# Patient Record
Sex: Male | Born: 1945
Health system: Southern US, Community
[De-identification: ages and names within clinical notes are randomized; demographics above are authoritative.]

## PROBLEM LIST (undated history)

## (undated) DIAGNOSIS — C61 Malignant neoplasm of prostate: Secondary | ICD-10-CM

## (undated) DIAGNOSIS — M75102 Unspecified rotator cuff tear or rupture of left shoulder, not specified as traumatic: Secondary | ICD-10-CM

## (undated) DIAGNOSIS — C4491 Basal cell carcinoma of skin, unspecified: Secondary | ICD-10-CM

## (undated) DIAGNOSIS — I00 Rheumatic fever without heart involvement: Secondary | ICD-10-CM

## (undated) DIAGNOSIS — N200 Calculus of kidney: Secondary | ICD-10-CM

## (undated) DIAGNOSIS — E291 Testicular hypofunction: Secondary | ICD-10-CM

## (undated) DIAGNOSIS — I519 Heart disease, unspecified: Secondary | ICD-10-CM

## (undated) DIAGNOSIS — K5792 Diverticulitis of intestine, part unspecified, without perforation or abscess without bleeding: Secondary | ICD-10-CM

## (undated) DIAGNOSIS — I480 Paroxysmal atrial fibrillation: Secondary | ICD-10-CM

## (undated) HISTORY — DX: Basal cell carcinoma of skin, unspecified: C44.91

## (undated) HISTORY — DX: Rheumatic fever without heart involvement: I00

## (undated) HISTORY — DX: Diverticulitis of intestine, part unspecified, without perforation or abscess without bleeding: K57.92

## (undated) HISTORY — DX: Heart disease, unspecified: I51.9

## (undated) HISTORY — DX: Malignant neoplasm of prostate: C61

## (undated) HISTORY — DX: Testicular hypofunction: E29.1

## (undated) HISTORY — PX: COLONOSCOPY: SHX174

## (undated) HISTORY — DX: Paroxysmal atrial fibrillation: I48.0

## (undated) HISTORY — DX: Calculus of kidney: N20.0

---

## 1998-04-12 ENCOUNTER — Encounter: Admission: RE | Admit: 1998-04-12 | Discharge: 1998-04-12 | Payer: Self-pay | Admitting: Family Medicine

## 1998-05-16 ENCOUNTER — Encounter: Admission: RE | Admit: 1998-05-16 | Discharge: 1998-05-16 | Payer: Self-pay | Admitting: Sports Medicine

## 1998-06-27 ENCOUNTER — Encounter: Admission: RE | Admit: 1998-06-27 | Discharge: 1998-06-27 | Payer: Self-pay | Admitting: Sports Medicine

## 1998-10-10 ENCOUNTER — Encounter: Admission: RE | Admit: 1998-10-10 | Discharge: 1998-10-10 | Payer: Self-pay | Admitting: Family Medicine

## 1999-08-03 ENCOUNTER — Observation Stay (HOSPITAL_COMMUNITY): Admission: RE | Admit: 1999-08-03 | Discharge: 1999-08-04 | Payer: Self-pay | Admitting: Orthopedic Surgery

## 2000-09-24 HISTORY — PX: OTHER SURGICAL HISTORY: SHX169

## 2000-11-15 ENCOUNTER — Encounter: Admission: RE | Admit: 2000-11-15 | Discharge: 2000-11-15 | Payer: Self-pay | Admitting: Family Medicine

## 2001-05-09 ENCOUNTER — Encounter: Payer: Self-pay | Admitting: Emergency Medicine

## 2001-05-09 ENCOUNTER — Emergency Department (HOSPITAL_COMMUNITY): Admission: EM | Admit: 2001-05-09 | Discharge: 2001-05-09 | Payer: Self-pay | Admitting: *Deleted

## 2002-01-06 ENCOUNTER — Ambulatory Visit (HOSPITAL_COMMUNITY): Admission: RE | Admit: 2002-01-06 | Discharge: 2002-01-06 | Payer: Self-pay | Admitting: Gastroenterology

## 2003-07-15 ENCOUNTER — Encounter: Payer: Self-pay | Admitting: Emergency Medicine

## 2003-07-15 ENCOUNTER — Emergency Department (HOSPITAL_COMMUNITY): Admission: EM | Admit: 2003-07-15 | Discharge: 2003-07-15 | Payer: Self-pay | Admitting: Emergency Medicine

## 2003-07-16 ENCOUNTER — Encounter: Payer: Self-pay | Admitting: Emergency Medicine

## 2003-09-25 HISTORY — PX: KNEE ARTHROSCOPY W/ PARTIAL MEDIAL MENISCECTOMY: SHX1882

## 2003-09-25 HISTORY — PX: KNEE SURGERY: SHX244

## 2005-08-30 ENCOUNTER — Ambulatory Visit (HOSPITAL_BASED_OUTPATIENT_CLINIC_OR_DEPARTMENT_OTHER): Admission: RE | Admit: 2005-08-30 | Discharge: 2005-08-30 | Payer: Self-pay | Admitting: Orthopedic Surgery

## 2005-08-30 ENCOUNTER — Ambulatory Visit (HOSPITAL_COMMUNITY): Admission: RE | Admit: 2005-08-30 | Discharge: 2005-08-30 | Payer: Self-pay | Admitting: Orthopedic Surgery

## 2006-12-19 ENCOUNTER — Inpatient Hospital Stay (HOSPITAL_COMMUNITY): Admission: RE | Admit: 2006-12-19 | Discharge: 2006-12-20 | Payer: Self-pay | Admitting: Urology

## 2006-12-19 ENCOUNTER — Encounter (INDEPENDENT_AMBULATORY_CARE_PROVIDER_SITE_OTHER): Payer: Self-pay | Admitting: Specialist

## 2006-12-19 HISTORY — PX: ROBOT ASSISTED LAPAROSCOPIC RADICAL PROSTATECTOMY: SHX5141

## 2006-12-19 HISTORY — PX: PROSTATE SURGERY: SHX751

## 2007-02-18 ENCOUNTER — Ambulatory Visit: Admission: RE | Admit: 2007-02-18 | Discharge: 2007-04-11 | Payer: Self-pay | Admitting: Radiation Oncology

## 2007-09-25 DIAGNOSIS — C4491 Basal cell carcinoma of skin, unspecified: Secondary | ICD-10-CM

## 2007-09-25 HISTORY — DX: Basal cell carcinoma of skin, unspecified: C44.91

## 2007-10-26 LAB — HM COLONOSCOPY: HM Colonoscopy: 1

## 2008-08-03 ENCOUNTER — Encounter: Payer: Self-pay | Admitting: Family Medicine

## 2008-09-23 LAB — HM COLONOSCOPY

## 2008-11-10 ENCOUNTER — Ambulatory Visit: Payer: Self-pay | Admitting: Family Medicine

## 2008-11-10 DIAGNOSIS — Z8679 Personal history of other diseases of the circulatory system: Secondary | ICD-10-CM | POA: Insufficient documentation

## 2008-11-10 DIAGNOSIS — Z85828 Personal history of other malignant neoplasm of skin: Secondary | ICD-10-CM

## 2008-11-10 DIAGNOSIS — Z8546 Personal history of malignant neoplasm of prostate: Secondary | ICD-10-CM

## 2008-11-10 DIAGNOSIS — Z8719 Personal history of other diseases of the digestive system: Secondary | ICD-10-CM | POA: Insufficient documentation

## 2009-04-19 ENCOUNTER — Telehealth: Payer: Self-pay | Admitting: Family Medicine

## 2009-04-20 ENCOUNTER — Encounter (INDEPENDENT_AMBULATORY_CARE_PROVIDER_SITE_OTHER): Payer: Self-pay | Admitting: *Deleted

## 2009-04-27 ENCOUNTER — Ambulatory Visit: Payer: Self-pay | Admitting: Family Medicine

## 2009-04-27 DIAGNOSIS — R5383 Other fatigue: Secondary | ICD-10-CM

## 2009-04-27 DIAGNOSIS — R5381 Other malaise: Secondary | ICD-10-CM

## 2009-04-27 LAB — CONVERTED CEMR LAB
ALT: 20 units/L (ref 0–53)
AST: 28 units/L (ref 0–37)
Albumin: 4 g/dL (ref 3.5–5.2)
Alkaline Phosphatase: 78 units/L (ref 39–117)
BUN: 16 mg/dL (ref 6–23)
Bilirubin, Direct: 0.1 mg/dL (ref 0.0–0.3)
CO2: 31 meq/L (ref 19–32)
Calcium: 9.1 mg/dL (ref 8.4–10.5)
Chloride: 106 meq/L (ref 96–112)
Cholesterol: 185 mg/dL (ref 0–200)
Creatinine, Ser: 1.3 mg/dL (ref 0.4–1.5)
GFR calc non Af Amer: 58.84 mL/min (ref >60)
Glucose, Bld: 88 mg/dL (ref 70–99)
HDL: 54.9 mg/dL (ref >39.00)
LDL Cholesterol: 122 mg/dL — ABNORMAL HIGH (ref 0–99)
Potassium: 4.3 meq/L (ref 3.5–5.1)
Sodium: 141 meq/L (ref 135–145)
TSH: 2.07 microintl units/mL (ref 0.35–5.50)
Total Bilirubin: 1.2 mg/dL (ref 0.3–1.2)
Total CHOL/HDL Ratio: 3
Total Protein: 6.7 g/dL (ref 6.0–8.3)
Triglycerides: 43 mg/dL (ref 0.0–149.0)
VLDL: 8.6 mg/dL (ref 0.0–40.0)

## 2009-05-02 ENCOUNTER — Ambulatory Visit: Payer: Self-pay | Admitting: Family Medicine

## 2009-08-04 ENCOUNTER — Ambulatory Visit: Payer: Self-pay | Admitting: Family Medicine

## 2009-08-04 ENCOUNTER — Telehealth: Payer: Self-pay | Admitting: Family Medicine

## 2009-08-04 ENCOUNTER — Encounter: Payer: Self-pay | Admitting: Family Medicine

## 2009-08-04 DIAGNOSIS — M7989 Other specified soft tissue disorders: Secondary | ICD-10-CM

## 2010-06-05 ENCOUNTER — Ambulatory Visit: Payer: Self-pay | Admitting: Family Medicine

## 2010-06-05 LAB — CONVERTED CEMR LAB
ALT: 18 units/L (ref 0–53)
AST: 26 units/L (ref 0–37)
Albumin: 4.1 g/dL (ref 3.5–5.2)
Alkaline Phosphatase: 65 units/L (ref 39–117)
BUN: 17 mg/dL (ref 6–23)
Basophils Absolute: 0 10*3/uL (ref 0.0–0.1)
Basophils Relative: 0.7 % (ref 0.0–3.0)
Bilirubin, Direct: 0.2 mg/dL (ref 0.0–0.3)
CO2: 30 meq/L (ref 19–32)
Calcium: 9.1 mg/dL (ref 8.4–10.5)
Chloride: 104 meq/L (ref 96–112)
Cholesterol: 164 mg/dL (ref 0–200)
Creatinine, Ser: 1.1 mg/dL (ref 0.4–1.5)
Eosinophils Absolute: 0.2 10*3/uL (ref 0.0–0.7)
Eosinophils Relative: 3.7 % (ref 0.0–5.0)
GFR calc non Af Amer: 75.03 mL/min (ref >60)
Glucose, Bld: 86 mg/dL (ref 70–99)
HCT: 40.9 % (ref 39.0–52.0)
HDL: 49.9 mg/dL (ref >39.00)
Hemoglobin: 13.9 g/dL (ref 13.0–17.0)
LDL Cholesterol: 104 mg/dL — ABNORMAL HIGH (ref 0–99)
Lymphocytes Relative: 29.6 % (ref 12.0–46.0)
Lymphs Abs: 1.5 10*3/uL (ref 0.7–4.0)
MCHC: 33.9 g/dL (ref 30.0–36.0)
MCV: 95.4 fL (ref 78.0–100.0)
Monocytes Absolute: 0.4 10*3/uL (ref 0.1–1.0)
Monocytes Relative: 7.9 % (ref 3.0–12.0)
Neutro Abs: 2.9 10*3/uL (ref 1.4–7.7)
Neutrophils Relative %: 58.1 % (ref 43.0–77.0)
PSA: 0.02 ng/mL — ABNORMAL LOW (ref 0.10–4.00)
Platelets: 189 10*3/uL (ref 150.0–400.0)
Potassium: 4.1 meq/L (ref 3.5–5.1)
RBC: 4.29 M/uL (ref 4.22–5.81)
RDW: 13.3 % (ref 11.5–14.6)
Sodium: 142 meq/L (ref 135–145)
TSH: 2.99 microintl units/mL (ref 0.35–5.50)
Total Bilirubin: 0.9 mg/dL (ref 0.3–1.2)
Total CHOL/HDL Ratio: 3
Total Protein: 6.3 g/dL (ref 6.0–8.3)
Triglycerides: 52 mg/dL (ref 0.0–149.0)
VLDL: 10.4 mg/dL (ref 0.0–40.0)
WBC: 5.1 10*3/uL (ref 4.5–10.5)

## 2010-06-26 ENCOUNTER — Ambulatory Visit: Payer: Self-pay | Admitting: Family Medicine

## 2010-10-24 NOTE — Progress Notes (Signed)
Summary: Implant ID Card Brought by Patient  Implant ID Card Brought by Patient   Imported By: Lanelle Bal 07/03/2010 09:13:44  _____________________________________________________________________  External Attachment:    Type:   Image     Comment:   External Document

## 2010-10-24 NOTE — Assessment & Plan Note (Signed)
Summary: CPX CYD   Vital Signs:  Patient profile:   65 year old male Height:      69.5 inches Weight:      171.0 pounds BMI:     24.98 Temp:     97.5 degrees F oral Pulse rate:   64 / minute Pulse rhythm:   regular BP sitting:   110 / 70  (left arm) Cuff size:   regular  Vitals Entered By: Benny Lennert CMA Duncan Dull) (June 26, 2010 12:09 PM)  Vision Screening:Left eye with correction: 20 / 20 Right eye with correction: 20 / 20 Both eyes with correction: 20 / 20  Color vision testing: normal   Ishmael Holter # 2: Pass     Vision Entered By: Benny Lennert CMA (AAMA) (June 26, 2010 12:27 PM)  Hearing Screen 40db HL: Left  500 hz: 40db 1000 hz: 40db 2000 hz: 40db 4000 hz: 40db Right  500 hz: 40db 1000 hz: 40db 2000 hz: 40db 4000 hz: 40db    History of Present Illness: Chief complaint cpx  Here for Medicare AWV:  1.   Risk factors based on Past M, S, F history: 2.   Physical Activities: running weights 3.   Depression/mood: negative, all normal 4.   Hearing: WNL 5.   ADL's: full function, no limitations, active 6.   Fall Risk: none. a runner. 7.   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   Height, weight, &visual acuity: WNL 9.   Counseling: doing well, good home life 10.   Labs ordered based on risk factors: reviewed 11.           Referral Coordination: none 12.           Care Plan: reviewed 1.            Cognitive Assessment: full financial care at home, no probs, upper level cognitive function, driving, finances, memory    Preventive Screening-Counseling & Management  Alcohol-Tobacco     Alcohol drinks/day: 0     Smoking Status: quit     Smoke Cessation Stage: quit     Tobacco Counseling: not indicated; no tobacco use  Caffeine-Diet-Exercise     Diet Counseling: not indicated; diet is assessed to be healthy     Does Patient Exercise: yes     Type of exercise: running     Exercise (avg: min/session): 4 miles running     Times/week: 5   Exercise Counseling: not indicated; exercise is adequate  Hep-HIV-STD-Contraception     Hepatitis Risk: no risk noted     HIV Risk: no risk noted     STD Risk: no risk noted     Contraception Counseling: not indicated; no questions/concerns expressed     Dental Care Counseling: not indicated; dental care within six months      Sexual History:  currently monogamous.        Drug Use:  never.    Clinical Review Panels:  Prevention   Last Colonoscopy:  2009 (09/23/2008)   Last PSA:  0.02 (06/05/2010)  Immunizations   Last Pneumovax:  given (08/14/2006)   Last Zoster Vaccine:  given (08/14/2006)  Lipid Management   Cholesterol:  164 (06/05/2010)   LDL (bad choesterol):  104 (06/05/2010)   HDL (good cholesterol):  49.90 (06/05/2010)  Diabetes Management   Creatinine:  1.1 (06/05/2010)   Last Pneumovax:  given (08/14/2006)  CBC   WBC:  5.1 (06/05/2010)   RBC:  4.29 (06/05/2010)   Hgb:  13.9 (06/05/2010)   Hct:  40.9 (06/05/2010)   Platelets:  189.0 (06/05/2010)   MCV  95.4 (06/05/2010)   MCHC  33.9 (06/05/2010)   RDW  13.3 (06/05/2010)   PMN:  58.1 (06/05/2010)   Lymphs:  29.6 (06/05/2010)   Monos:  7.9 (06/05/2010)   Eosinophils:  3.7 (06/05/2010)   Basophil:  0.7 (06/05/2010)  Complete Metabolic Panel   Glucose:  86 (06/05/2010)   Sodium:  142 (06/05/2010)   Potassium:  4.1 (06/05/2010)   Chloride:  104 (06/05/2010)   CO2:  30 (06/05/2010)   BUN:  17 (06/05/2010)   Creatinine:  1.1 (06/05/2010)   Albumin:  4.1 (06/05/2010)   Total Protein:  6.3 (06/05/2010)   Calcium:  9.1 (06/05/2010)   Total Bili:  0.9 (06/05/2010)   Alk Phos:  65 (06/05/2010)   SGPT (ALT):  18 (06/05/2010)   SGOT (AST):  26 (06/05/2010)   Allergies (verified): No Known Drug Allergies  Past History:  Past medical, surgical, family and social histories (including risk factors) reviewed, and no changes noted (except as noted below).  Past Medical History: Reviewed history from  04/19/2009 and no changes required. R ITB syndrome Prostate cancer, hx of:  pT2c N0 Mx, Gleason 3+4 = 7 Diverticulitis, hx of Kidney Stones Rheumatic Fever(1952/1953) Basal Cell CA, back, 2009 Elevated PSA Hypogonadism Diverticulosis Erectile Dysfunction h/o Nephrolithiasis  Ortho = Gioffre, [place] [facility] = Yetta Barre Urology = Laverle Patter  Past Surgical History: Reviewed history from 04/19/2009 and no changes required. shoulder /Rotator cuff (2002) (Gioffre) Knee surgery (2005) L[facility]oy[facility]), arthroscopy, partial medial menisectomy Prostate Ca (11/2006), robotic prostatectomy, 12/19/06  Upper endoscopy, 11/12/2007: normal Colonoscopy, 10/2007: 1 transverese colon polyp, diverticulosis  Family History: Reviewed history from 04/19/2009 and no changes required. Family History Breast cancer 1st degree relative <50 (mother) Family History of Heart Disease Family History of arthritis (other relative) Family History of Prostate Ca (other relative)  BRCA: mother, d/c 41 Brain Tumor: brother, 64 Lung CA: nephew, 35 Prostate CA: nephew  Social History: Reviewed history from 11/10/2008 and no changes required. Retired- Emergency planning/management officer Married Former Smoker ( occasionally [phone]) Alcohol use-no Drug use-no Regular exercise-yes  Review of Systems  General: Denies fever, chills, sweats, anorexia, fatigue, weakness, malaise Eyes: Denies blurring, vision loss ENT: Denies earache, nasal congestion, nosebleeds, sore throat, and hoarseness.  Cardiovascular: Denies chest pains, palpitations, syncope, dyspnea on exertion,  Respiratory: Denies cough, dyspnea at rest, excessive sputum,wheeezing GI: Denies nausea, vomiting, diarrhea, constipation, change in bowel habits, abdominal pain, melena, hematochezia GU: Denies dysuria, hematuria, discharge, urinary frequency, urinary hesitancy, nocturia, incontinence, genital sores, decreased libido Musculoskeletal: Denies back pain, joint pain Derm: Denies rash,  itching Neuro: Denies  paresthesias, frequent falls, frequent headaches, and difficulty walking.  Psych: Denies depression, anxiety Endocrine: Denies cold intolerance, heat intolerance, polydipsia, polyphagia, polyuria, and unusual weight change.  Heme: Denies enlarged lymph nodes Allergy: No hayfever    Impression & Recommendations:  Problem # 1:  HEALTH MAINTENANCE EXAM (ICD-V70.0)  The patient's preventative maintenance and recommended screening tests for an annual wellness exam were reviewed in full today. Brought up to date unless services declined.  Counselled on the importance of diet, exercise, and its role in overall health and mortality. The patient's FH and SH was reviewed, including their home life, tobacco status, and drug and alcohol status.   Orders: Medicare -1st Annual Wellness Visit [phone])  Complete Medication List: 1)  Aspirin Adult Low Strength 81 Mg Tbec (Aspirin) .... Take 1 tablet by mouth once a  day  Current Allergies (reviewed today): No known allergies      Prevention & Chronic Care Immunizations   Influenza vaccine: Not documented   Influenza vaccine deferral: Deferred  (06/26/2010)    Tetanus booster: Not documented   Tetanus booster due: 09/25/2015    Pneumococcal vaccine: given  (08/14/2006)   Pneumococcal vaccine due: None    H. zoster vaccine: 08/14/2006: given  Colorectal Screening   Hemoccult: Not documented    Colonoscopy: 2009  (09/23/2008)   Colonoscopy action/deferral: Not indicated  (06/26/2010)   Colonoscopy due: 09/23/2018  Other Screening   PSA: 0.02  (06/05/2010)   PSA due due: 06/06/2011   Smoking status: quit  (06/26/2010)  Lipids   Total Cholesterol: 164  (06/05/2010)   LDL: 104  (06/05/2010)   LDL Direct: Not documented   HDL: 49.90  (06/05/2010)   Triglycerides: 52.0  (06/05/2010)  Physical Exam General Appearance: well developed, well nourished, no acute distress Eyes: conjunctiva and lids normal,  PERRLA, EOMI Ears, Nose, Mouth, Throat: TM clear, nares clear, oral exam WNL Neck: supple, no lymphadenopathy, no thyromegaly, no JVD Respiratory: clear to auscultation and percussion, respiratory effort normal Cardiovascular: regular rate and rhythm, S1-S2, no murmur, rub or gallop, no bruits, peripheral pulses normal and symmetric, no cyanosis, clubbing, edema or varicosities Chest: no scars, masses, tenderness; no asymmetry, skin changes, nipple discharge, no gynecomastia   Gastrointestinal: soft, non-tender; no hepatosplenomegaly, masses; active bowel sounds all quadrants, no prostate Genitourinary: no hernia, testicular mass, penile discharge, priapism or prostate enlargement Lymphatic: no cervical, axillary or inguinal adenopathy Musculoskeletal: gait normal, muscle tone and strength WNL, no joint swelling, effusions, discoloration, crepitus  Skin: clear, good turgor, color WNL, no rashes, lesions, or ulcerations Neurologic: normal mental status, normal reflexes, normal strength, sensation, and motion Psychiatric: alert; oriented to person, place and time Other Exam:

## 2011-01-22 ENCOUNTER — Ambulatory Visit (INDEPENDENT_AMBULATORY_CARE_PROVIDER_SITE_OTHER): Payer: Medicare Other | Admitting: Family Medicine

## 2011-01-22 ENCOUNTER — Encounter: Payer: Self-pay | Admitting: Family Medicine

## 2011-01-22 VITALS — BP 120/72 | HR 59 | Temp 97.8°F | Ht 71.0 in | Wt 176.4 lb

## 2011-01-22 DIAGNOSIS — J189 Pneumonia, unspecified organism: Secondary | ICD-10-CM

## 2011-01-22 DIAGNOSIS — J157 Pneumonia due to Mycoplasma pneumoniae: Secondary | ICD-10-CM

## 2011-01-22 MED ORDER — AZITHROMYCIN 250 MG PO TABS: 250.0000 mg | ORAL_TABLET | Freq: Every day | ORAL | Status: DC

## 2011-01-22 MED ORDER — ALBUTEROL SULFATE HFA 108 (90 BASE) MCG/ACT IN AERS: 2.0000 | INHALATION_SPRAY | Freq: Four times a day (QID) | RESPIRATORY_TRACT | Status: DC | PRN

## 2011-01-22 NOTE — Patient Instructions (Signed)
Fever, cough, chest pain, shortness of breath, phlegm production, fatigue are symptoms.  Treatment: 1. Take all medicines 2. Antibiotics  3. Cough suppressants 4. Bronchodilators: an inhaler 5. Expectorant like Guaifenesin (Robitussin, Mucinex)  Fluids and Moisture help: drink lots of fluids Vaporizier or humidifier in room, shower steam --help loosen secretions and sooth breathing passages  Elevate head slightly when trying to sleep.

## 2011-01-22 NOTE — Progress Notes (Signed)
65 year old male:  1 month of sickness, had a cold and yellowish and in his chest. Coughing up a great deal of yellowish and greening.  Amox 500 mg, took bid for 4 days. Continued to have clear thick chest mucous and some in his nasal cavities. Now, when he coughs, will cough up more.  Pneumonia: Patient complains of evaluation of cough, possible bronchitis, possible pneumonia. Patient describes symptoms of shortness of breath on exertion, cough, fatigue, malaise, sore throat, sputum production and wheezing. Symptoms began 4 weeks ago and are unchanged since that time. Patient denies weight loss or nausea and vomiting. Treatment thus far includes OTC analgesics/antipyretics: not very effective and anti-tussive: not very effective Past pulmonary history is significant for occasional episodes of bronchitis  The PMH, PSH, Social History, Family History, Medications, and allergies have been reviewed in [facility], and have been updated if relevant.  ROS: GEN: Acute illness details above GI: Tolerating PO intake GU: maintaining adequate hydration and urination Pulm: No SOB Interactive and getting along well at home.  Otherwise, ROS is as per the HPI.  GEN: A and O x 3. WDWN. NAD.    ENT: Nose clear, ext NML.  No LAD.  No JVD.  TM's clear. Oropharynx clear.  PULM: Normal WOB, no distress. No crackles, scattered wheezes, no rhonchi CV: RRR, no M/G/R, No rubs, No JVD.   ABD: S, NT, ND, + BS. No rebound. No guarding. No HSM.   EXT: warm and well-perfused, No c/c/e. PSYCH: Pleasant and conversant.  A/P: Walking pneumonia, cannot rule out pertussis: treat with Z-pak, ventolin. Refer to the patient instructions sections for details of plan shared with patient.

## 2011-02-09 NOTE — H&P (Signed)
NAMEHARLO, FABELA NO.:  [id number]   MEDICAL RECORD NO.:  [id number]          PATIENT TYPE:  INP   LOCATION:  NA                           FACILITY:  [facility]   PHYSICIAN:  Heloise Purpura, MD      DATE OF BIRTH:  1945/10/26   DATE OF ADMISSION:  DATE OF DISCHARGE:                              HISTORY & PHYSICAL   CHIEF COMPLAINT:  Prostate cancer.   HISTORY:  Mr. Rathgeber is a 65 year old gentleman with clinical stage T1C  prostate cancer with a PSA of 4.72 and Gleason score 3 +3 equals 6.  His  prostate biopsy was performed on 10/10/2006 and demonstrated less than  10% of the left-sided specimen and 40% of the right-sided specimen to be  involved with adenocarcinoma.  After discussing management options, the  patient elected to proceed with surgical therapy and a robotic assisted  laparoscopic approach.   PAST MEDICAL HISTORY:  1. History of rheumatic fever and heart murmur.  2. Syncopal episode 3 years ago.  The patient has been evaluated by      Dr. Mosetta Putt and also underwent a recent EKG.  He was felt to      be stable for surgery.  He did not have cardiac involvement during      his episode of rheumatic fever and his syncopal episode was felt to      be due to a vasovagal episode.   PAST SURGICAL HISTORY:  1. Left knee arthroscopy.  2. Right rotator cuff repair.   MEDICATIONS:  Aspirin which is on hold.   ALLERGIES:  NO KNOWN DRUG ALLERGIES.   FAMILY HISTORY:  The patient's mother died at age 86 from breast cancer.  His father lived to age 79 and died of coronary artery disease.  The  patient's brother also has a history of brain cancer.  There is no  history of prostate cancer or other GU malignancy.   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  He did smoke one-third pack of  cigarettes for 4 years and quit 30 years ago.  He denies alcohol use.   REVIEW OF SYSTEMS:  Complete  review of systems was performed.  The only  pertinent positive is sinus problems other than the history of present  illness and past medical history as stated above.   PHYSICAL EXAMINATION:  CONSTITUTIONAL:  A well-nourished, well-  developed, age-appropriate male in no acute distress.  CARDIOVASCULAR:  Regular rate and rhythm without obvious murmurs.  LUNGS:  Clear bilaterally.  BACK:  No CVA tenderness.  ABDOMEN:  Soft, nontender, nondistended without abdominal masses.  DIGITAL/RECTAL:  No prostate induration or nodularity.   IMPRESSION:  Clinically localized adenocarcinoma of the prostate.   PLAN:  Mr. Heyward will undergo a robotic assisted laparoscopic radical  prostatectomy and then be admitted to the hospital for routine  postoperative care.           ______________________________  Heloise Purpura, MD  Electronically Signed     LB/MEDQ  D:  12/19/2006  T:  12/19/2006  Job:  [id number]

## 2011-02-09 NOTE — Op Note (Signed)
NAMEDENARIUS, SESLER NO.:  [id number]   MEDICAL RECORD NO.:  [id number]          PATIENT TYPE:  INP   LOCATION:  X001                         FACILITY:  [facility]   PHYSICIAN:  Heloise Purpura, MD      DATE OF BIRTH:  07-22-1946   DATE OF PROCEDURE:  12/19/2006  DATE OF DISCHARGE:                               OPERATIVE REPORT   PREOPERATIVE DIAGNOSIS:  Clinically localized adenocarcinoma of  prostate.   POSTOPERATIVE DIAGNOSIS:  Clinically localized adenocarcinoma of  prostate.   PROCEDURE:  Robotic assisted laparoscopic radical prostatectomy  (bilateral nerve sparing).   SURGEON:  Heloise Purpura, MD   ASSISTANT:  Cornelious Bryant, MD   ANESTHESIA:  General.   ESTIMATED BLOOD LOSS:  300 mL.   INTRAVENOUS FLUIDS:  1800 mL of lactated Ringer's.   SPECIMENS:  Prostate and seminal vesicles.   DISPOSITION OF PATHOLOGY SPECIMEN:  To pathology.  Drains/ you>  1. a 20-french Coude catheter.  2. a #19 Blake pelvic drain.   INDICATIONS:  Mr. Bertone is a 65 year old gentleman with clinical stage  T1C prostate cancer with a PSA of 4.72 and Gleason score of 3 + 3 equals  6.  After discussing management options for clinically localized  prostate cancer, the patient elected to proceed with surgical therapy.  Potential risks and benefits of the above procedures were discussed with  the patient, and he consented.   DESCRIPTION OF PROCEDURE:  The patient was taken to the operating room  and a general anesthetic was administered.  The patient was placed in  the dorsal lithotomy position, and prepped and draped in the usual  sterile fashion.  After discussing management options for clinically  localized prostate cancer, the patient elected to proceed with the above  procedure.  Potential risks and benefits were discussed with the  patient, and he consented.   DESCRIPTION OF PROCEDURE:  The patient was taken to the operating room  and a general anesthetic was  administered.  He was given preoperative  antibiotics, placed in the dorsal lithotomy position, and prepped and  draped in the usual sterile fashion.  Next, and a preoperative time-out  was performed.  A Foley catheter was then inserted into the bladder.  A  site was selected, and 18 cm from the pubic symphysis, and just to the  left of the umbilicus for placement of the camera port.  This was placed  using a standard open Hassan technique.  This allowed entry into the  peritoneal cavity under direct vision; and without difficulty.  A 12-mm  port was placed; and the pneumoperitoneum was established.  The 0-degree  lens was then used to inspect the abdomen; and there was no evidence of  any intra-abdominal injuries or other abnormalities.   Attention then turned to placement of the remaining ports.  Bilateral 8-  mm robotic ports were placed laterally to the camera ports.  An  additional 8-mm robotic port was placed in the far left lateral  abdominal wall.  A 5-mm port was placed between the camera port; and the  right robotic port.  An additional 12-mm port was placed in the far  right lateral abdominal wall for laparoscopic assistance.  All ports  were placed under direct vision; and without difficulty.  The surgical  cart was then docked.  With the aid of the cautery scissors, the bladder  was reflected posteriorly allowing entry into the space of Retzius, and  identification of the endopelvic fascia and prostate.  The endopelvic  fascia was then incised from the apex back to the base of the prostate  bilaterally; and the underlying levator muscle fibers were swept  laterally off the prostate.  This exposed and isolated the dorsal venous  complex which was stapled and divided with a 45-mm flex-ETS stapler.   The bladder neck was then identified with the aid of Foley catheter  manipulation and divided anteriorly.  This exposed the Foley catheter  which was brought into the operative  field, and used to retract the  prostate anteriorly.  This helped to expose the posterior bladder neck  which was then divided and dissection continued between the bladder  neck, and prostate until the vasa deferentia and seminal vesicles were  identified.  The vasa deferentia were isolated and divided.  The seminal  vesicles were dissected free with care to control the seminal vesicle  arterial blood supply.  They were then lifted anteriorly as well.  The  space between Denonvilliers fascia  and the anterior rectum was then  bluntly developed, thereby, isolating the vascular pedicles of the  prostate.  The lateral prostatic fascia was then incised bilaterally  allowing the neurovascular bundles to be swept laterally and posteriorly  off the prostate.  The vascular pedicles of the prostate were then  ligated with Hem-o-lok clips and divided with sharp cold scissor  dissection.  The neurovascular bundles was then swept off the prostate  to the apex.  The urethra was then sharply divided allowing the prostate  specimen to be disarticulated.  The pelvis was then copiously irrigated  and hemostasis was ensured.  With irrigation in the pelvis, air was  injected into the rectal catheter; and there was no evidence of a rectal  injury.   Attention then turned to the urethral anastomosis.  A 2-0 Vicryl slip-  knot was placed at the 6 o'clock position between the bladder neck and  urethra; and used to reapproximate these structures.  A double-armed 3-0  Monocryl suture was then used perform a 360-degrees running tension-free  anastomosis between the bladder neck and urethra.  A new 20-French Coude  catheter was inserted into the bladder and irrigated.  There were no  blood clots within the bladder; and the anastomosis appeared to be  watertight.  A #19 Blake drain was then brought to the left robotic port and appropriately positioned in the pelvis.  It was secured to skin with  a nylon suture.   The Endopouch retrieval bag was then used to retrieve  the prostate specimen via the periumbilical port site.  A #0 Vicryl  stitch was placed through the right lateral 12-mm port site, for port  site closure.  The prostate specimen was then removed, intact, within  the Endopouch retrieval bag via the periumbilical port site.  This  fascial opening was then closed with a running #0 Vicryl suture.  All  remaining ports were removed under direct vision, prior to removal of  the camera port.  All incision sites were then injected with 1/4%  Marcaine, and reapproximated at the skin level with staples.  Sterile  dressings were applied.  The patient appeared to tolerate the procedure  well.  There were no complications.  He was able to be extubated, and  transferred to the recovery unit in satisfactory condition.           ______________________________  Heloise Purpura, MD  Electronically Signed     LB/MEDQ  D:  12/19/2006  T:  12/19/2006  Job:  [id number]

## 2011-02-09 NOTE — Discharge Summary (Signed)
Derek, [name] NO.:  [id number]   MEDICAL RECORD NO.:  [id number]          PATIENT TYPE:  INP   LOCATION:  1435                         FACILITY:  [facility]   PHYSICIAN:  Heloise Purpura, MD      DATE OF BIRTH:  Dec 25, 1945   DATE OF ADMISSION:  12/19/2006  DATE OF DISCHARGE:                               DISCHARGE SUMMARY   ADMISSION DIAGNOSIS:  Prostate cancer.   DISCHARGE DIAGNOSIS:  Prostate cancer.   HISTORY AND PHYSICAL:  For full details please see admission history and  physical.  Briefly, Derek [name] is a 65 year old gentleman with clinical  stage T1c prostate cancer with a PSA of 4.72 and Gleason score of 3 + 3  = 6.  After discussing management options for clinically-localized  prostate cancer, the patient elected to proceed with surgical therapy  and a robotic prostatectomy.   HOSPITAL COURSE:  On December 19, 2006, the patient was taken to the  operating room and a robotic-assisted laparoscopic radical prostatectomy  was performed.  The patient tolerated the procedure well without  complications.  Postoperatively, he was able to be transferred to a  regular hospital room following recovery from anesthesia.  He was able  to begin ambulating on the evening of surgery.  By postoperative day #1,  he maintained excellent urine output from his Foley catheter with  minimal output from his pelvic drain and his pelvic drain was able to be  removed.  He was begun on clear liquid diet which he tolerated without  difficulty.  He was therefore able to be transitioned to oral pain  medication.  His hemoglobin remained stable at 12.2.  He also remained  hemodynamically stable overnight.  He was therefore able to be  discharged home in excellent condition on postoperative day #1 after  meeting all discharge criteria.   DISPOSITION:  Home.   DISCHARGE MEDICATIONS:  The patient was given a prescription to take  Darvocet as needed for pain and to use Colace as a stool  softener.  He  was given a prescription also to begin Cipro 1 day prior his return  visit for Foley catheter removal.  He was instructed that he may resume  his regular home medications excepting any aspirin, nonsteroidal anti-  inflammatory drugs, or herbal supplements.   DISCHARGE INSTRUCTIONS:  The patient was instructed to gradually advance  his diet once passing flatus.  He was told that he may be ambulatory and  actually encouraged to be ambulatory, but specifically told to refrain  from any heavy lifting, strenuous activity, or driving.  He was given  instructions on routine Foley catheter care.   FOLLOWUP:  Derek [name] will follow up in 1 week for removal of his Foley  catheter and to discuss his surgical pathology in detail.          ______________________________  Heloise Purpura, MD  Electronically Signed    LB/MEDQ  D:  12/20/2006  T:  12/20/2006  Job:  [id number]

## 2011-02-09 NOTE — Op Note (Signed)
NAMEDAMARIAN, PRIOLA NO.:  [id number]   MEDICAL RECORD NO.:  [id number]          PATIENT TYPE:  AMB   LOCATION:  DSC                          FACILITY:  [facility]   PHYSICIAN:  Nadara Mustard, MD     DATE OF BIRTH:  [birth year]   DATE OF PROCEDURE:  08/30/2005  DATE OF DISCHARGE:                                 OPERATIVE REPORT   PREOP DIAGNOSIS:  Medial meniscal tear left knee.   POSTOP DIAGNOSIS:  Medial meniscal tear left knee with osteochondral defect  of the patella and trochlea.   PROCEDURE:  1.  Partial medial meniscectomy.  2.  Abrasion chondroplasty of the patella and trochlear.   SURGEON:  Nadara Mustard, MD   ANESTHESIA:  Knee block plus MAC.   ESTIMATED BLOOD LOSS:  Minimal.   ANTIBIOTICS:  None.   DRAINS:  None.   COMPLICATIONS:  None.   DISPOSITION:  To PACU in stable condition.   INDICATIONS FOR PROCEDURE:  The patient is a 65 year old gentleman, active  runner who has been having increasing mechanical pain of the medial joint  line of his left knee. The patient has failed conservative care and  presents, at this time, for arthroscopic intervention. The risks and  benefits were discussed including infection, neurovascular injury,  persistent pain, need for additional surgery. The patient states he  understands and wish to proceed at this time.   DESCRIPTION OF PROCEDURE:  The patient was brought to OR room #5 after  undergoing a knee block. After an adequate level of anesthesia was obtained  the patient's left lower extremity was prepped using DuraPrep and draped  into a sterile field. The scope was inserted through the inferior medial  portal and an inferolateral working portal was established. With the scope  in place the knee was placed in a valgus stress and the patient had a large  degenerative tear of the posterior horn of the medial meniscus. This was  debrided, with a shaver, back to a smooth stable viable edge. There were no  articular cartilage defects of the medial femoral condyle or medial tibial  plateau. Examination of notch showed an intact ACL. Examination of the  lateral joint line in the figure-four position showed intact articular  cartilage as well as intact lateral meniscus. Visualization of the  patellofemoral joint with the knee extended showed an osteochondral defect  of the patella and trochlea. This was debrided back to a viable subchondral  bone. Examination of both the medial lateral gutters showed there be no  loose bodies. The suprapatellar pouch had no loose bodies. A survey was  then, again, performed of all three compartments. There were no loose  bodies.   The instruments were removed. The joint was infused with a total 20 cc of  1/2% Marcaine plain. The portals were closed using 3-0 nylon. The wound was  covered with Adaptic, orthopedic sponges, sterile Webril, and a Coban  dressing. The patient was then taken to the PACU in stable condition.   PLAN:  To followup in the office in 2 weeks.  Nadara Mustard, MD  Electronically Signed     MVD/MEDQ  D:  08/30/2005  T:  08/31/2005  Job:  [id number]

## 2011-02-09 NOTE — Procedures (Signed)
[place]  Patient:    Derek [name], Derek [name] Visit Number: [id number] MRN: [id number]          Service Type: END Location: ENDO Attending Physician:  Rich Brave Dictated by:   Florencia Reasons, M.D. Proc. Date: 01/06/02 Admit Date:  01/06/2002   CC:         Carolyne Fiscal, M.D.   Procedure Report  PROCEDURE PERFORMED:  Colonoscopy.  ENDOSCOPIST:  Florencia Reasons, M.D.  INDICATIONS FOR PROCEDURE:  The patient is a 65 year old gentleman for colon cancer screening.  No worrisome symptoms.  FINDINGS:  Significant sigmoid diverticulosis with associated fixation of the colon.  DESCRIPTION OF PROCEDURE:  The nature, purpose and risks of the procedure had been discussed with the patient, who provided written consent.  Sedation was fentanyl 100 mcg and Versed 8.5 mg IV without arrhythmias other than his baseline bradycardia, or significant desaturation (monitor had a low reading transiently, but this was felt to be artifactual).  Digital exam of the prostate was normal.  The exam was initiated with the Olympus adult video colonoscope, but this could not be advanced beyond approximately 30 or 40 cm due to severe angulation and fixation of the colon.  The scope was removed, inspecting as I came out, and then the patient was recolonoscoped using the adjustable tension pediatric video colonoscope which was able to negotiate the thickened angulated sigmoid region with mild to moderate difficulty and then be advanced quite easily the remainder of the way around the colon, using some external abdominal compression to get the tip of the scope into the base of the cecum, after which pullback was initiated.  The cecum was identified by visualization of the ileocecal valve and appendiceal orifice.  Pullback was then performed.  The quality of the prep was excellent and it is felt that all areas were well seen.  There was significant sigmoid  diverticulosis and rather severe muscular thickening, stiffness and some degree of fixation of the sigmoid colon, but this was an otherwise normal examination without evidence of polyps, cancer, colitis or vascular malformations.  Inspection of the rectosigmoid again during pullout was normal.  Retroflexion with the adult scope had been unremarkable.  No biopsies were obtained during this exam.  The patient tolerated the procedure well and there were no apparent complications.  IMPRESSION:  Significant sigmoid diverticulosis and muscular thickening.  PLAN: Consider follow-up colonoscopy in five years for ongoing screening. Dictated by:   Florencia Reasons, M.D. Attending Physician:  Rich Brave DD:  01/06/02 TD:  01/06/02 Job: [id number]

## 2011-04-09 ENCOUNTER — Ambulatory Visit (INDEPENDENT_AMBULATORY_CARE_PROVIDER_SITE_OTHER): Payer: Medicare Other | Admitting: Family Medicine

## 2011-04-09 ENCOUNTER — Encounter: Payer: Self-pay | Admitting: Family Medicine

## 2011-04-09 VITALS — BP 104/70 | HR 60 | Temp 98.3°F | Wt 175.8 lb

## 2011-04-09 DIAGNOSIS — J329 Chronic sinusitis, unspecified: Secondary | ICD-10-CM | POA: Insufficient documentation

## 2011-04-09 MED ORDER — AMOXICILLIN 875 MG PO TABS: 875.0000 mg | ORAL_TABLET | Freq: Two times a day (BID) | ORAL | Status: AC

## 2011-04-09 NOTE — Assessment & Plan Note (Signed)
Start amoxil, use veramyst samples and supportive tx o/w.  F/u prn, nontoxic.

## 2011-04-09 NOTE — Progress Notes (Signed)
duration of symptoms: 3.5 weeks Rhinorrhea: yes, yellow/green congestion:yes ear pain: no pain but they feel congested.   sore throat: esp in AM Cough: with sputum from post nasal gtt Myalgias: no Fevers: no other concerns: pressure across the face Motrin- minimal help with pressure  ROS: See HPI.  Otherwise negative.    Meds, vitals, and allergies reviewed.   GEN: nad, alert and oriented HEENT: mucous membranes moist, TM w/o erythema, nasal epithelium injected, OP with cobblestoning, Max sinus ttp, L canal irritated from Q tip  NECK: supple w/o LA CV: rrr. PULM: ctab, no inc wob

## 2011-04-09 NOTE — Patient Instructions (Signed)
Start the amoxil today and use the veramyst 2 sprays per nostril per day.  Take care and get some rest.  Drink plenty of fluids.

## 2011-06-29 ENCOUNTER — Encounter: Payer: Self-pay | Admitting: Family Medicine

## 2011-06-29 ENCOUNTER — Other Ambulatory Visit: Payer: Self-pay | Admitting: Family Medicine

## 2011-06-29 DIAGNOSIS — Z8546 Personal history of malignant neoplasm of prostate: Secondary | ICD-10-CM

## 2011-06-29 DIAGNOSIS — Z79899 Other long term (current) drug therapy: Secondary | ICD-10-CM

## 2011-06-29 DIAGNOSIS — Z13220 Encounter for screening for lipoid disorders: Secondary | ICD-10-CM

## 2011-07-02 ENCOUNTER — Other Ambulatory Visit (INDEPENDENT_AMBULATORY_CARE_PROVIDER_SITE_OTHER): Payer: Medicare Other

## 2011-07-02 DIAGNOSIS — Z79899 Other long term (current) drug therapy: Secondary | ICD-10-CM

## 2011-07-02 DIAGNOSIS — Z8546 Personal history of malignant neoplasm of prostate: Secondary | ICD-10-CM

## 2011-07-02 DIAGNOSIS — Z13220 Encounter for screening for lipoid disorders: Secondary | ICD-10-CM

## 2011-07-02 LAB — HEPATIC FUNCTION PANEL
ALT: 19 U/L (ref 0–53)
AST: 37 U/L (ref 0–37)
Bilirubin, Direct: 0.2 mg/dL (ref 0.0–0.3)
Total Protein: 7 g/dL (ref 6.0–8.3)

## 2011-07-02 LAB — LDL CHOLESTEROL, DIRECT: Direct LDL: 112.7 mg/dL

## 2011-07-02 LAB — CBC WITH DIFFERENTIAL/PLATELET
Basophils Relative: 0.7 % (ref 0.0–3.0)
Eosinophils Absolute: 0.2 10*3/uL (ref 0.0–0.7)
Hemoglobin: 14.7 g/dL (ref 13.0–17.0)
Lymphocytes Relative: 20.3 % (ref 12.0–46.0)
MCHC: 33.4 g/dL (ref 30.0–36.0)
MCV: 95.2 fl (ref 78.0–100.0)
Monocytes Absolute: 0.4 10*3/uL (ref 0.1–1.0)
Neutro Abs: 4.7 10*3/uL (ref 1.4–7.7)
RBC: 4.63 Mil/uL (ref 4.22–5.81)

## 2011-07-02 LAB — BASIC METABOLIC PANEL
BUN: 17 mg/dL (ref 6–23)
Calcium: 9.4 mg/dL (ref 8.4–10.5)
GFR: 55.96 mL/min — ABNORMAL LOW (ref >60.00)
Potassium: 4.3 mEq/L (ref 3.5–5.1)

## 2011-07-10 ENCOUNTER — Encounter: Payer: Self-pay | Admitting: Family Medicine

## 2011-07-10 ENCOUNTER — Ambulatory Visit (INDEPENDENT_AMBULATORY_CARE_PROVIDER_SITE_OTHER): Payer: Medicare Other | Admitting: Family Medicine

## 2011-07-10 VITALS — BP 100/60 | HR 60 | Temp 98.0°F | Ht 71.0 in | Wt 174.4 lb

## 2011-07-10 DIAGNOSIS — Z0000 Encounter for general adult medical examination without abnormal findings: Secondary | ICD-10-CM

## 2011-07-10 DIAGNOSIS — M25519 Pain in unspecified shoulder: Secondary | ICD-10-CM

## 2011-07-10 DIAGNOSIS — M25512 Pain in left shoulder: Secondary | ICD-10-CM

## 2011-07-10 NOTE — Progress Notes (Signed)
Subjective:    Patient ID: Derek [name], male    DOB: Oct 13, 1945, 65 y.o.   MRN: [id number]  HPI  [name], a 65 y.o. male presents today in the office for the following:    Medicare Wellness Visit and some acute probs:  Preventative Health Maintenance Visit:  Health Maintenance Summary Reviewed and updated, unless pt declines services.  Tobacco History Reviewed. Alcohol: No concerns, no excessive use Exercise Habits: Some activity, rec at least 30 mins 5 times a week STD concerns: no risk or activity to increase risk Drug Use: None Encouraged self-testicular check  Health Maintenance  Topic Date Due  . Tetanus/tdap  02/02/1963  . Pneumococcal Polysaccharide Vaccine Age 53 And Over  02/01/2009  . Influenza Vaccine  06/25/2011  . Colonoscopy  09/23/2018  . Zostavax  Completed    Labs reviewed with the patient.   Lipids:    Component Value Date/Time   CHOL 202* 07/02/2011 1104   TRIG 64.0 07/02/2011 1104   HDL 71.80 07/02/2011 1104   LDLDIRECT 112.7 07/02/2011 1104   VLDL 12.8 07/02/2011 1104   CHOLHDL 3 07/02/2011 1104    CBC:    Component Value Date/Time   WBC 6.7 07/02/2011 1104   HGB 14.7 07/02/2011 1104   HCT 44.0 07/02/2011 1104   PLT 249.0 07/02/2011 1104   MCV 95.2 07/02/2011 1104   NEUTROABS 4.7 07/02/2011 1104   LYMPHSABS 1.4 07/02/2011 1104   MONOABS 0.4 07/02/2011 1104   EOSABS 0.2 07/02/2011 1104   BASOSABS 0.0 07/02/2011 1104    Basic Metabolic Panel:    Component Value Date/Time   NA 142 07/02/2011 1104   K 4.3 07/02/2011 1104   CL 106 07/02/2011 1104   CO2 27 07/02/2011 1104   BUN 17 07/02/2011 1104   CREATININE 1.4 07/02/2011 1104   GLUCOSE 87 07/02/2011 1104   CALCIUM 9.4 07/02/2011 1104    Lab Results  Component Value Date   ALT 19 07/02/2011   AST 37 07/02/2011   ALKPHOS 78 07/02/2011   BILITOT 1.2 07/02/2011    Lab Results  Component Value Date   PSA 0.02* 07/02/2011   PSA 0.02* 06/05/2010     The patient noted above presents with shoulder  pain that has been ongoing for 5-6 months there is history of trauma with falling about 6 mo ago The patient denies neck pain or radicular symptoms. Denies dislocation, subluxation, separation of the shoulder. The patient does not complain of pain in the overhead plane. ER causes some pain.  Tried PT: No - but good HEP  Prior shoulder Injury: prior R RTC tear  In memorial day, thinks that he may have torn his left rotator cuff. Was going down and.  Still able to lift with his shoulder.   Hemorrhoids will flare occ.   Got a little dizzy a couple of weeks ago.  Then ran an hour on the treadmill three days later.  Dr. Duaine Dredge did an echo a number of years ago that was normal.   Patient Active Problem List  Diagnoses  . OTHER MALAISE AND FATIGUE  . PROSTATE CANCER, HX OF  . CARCINOMA, BASAL CELL, HX OF  . RHEUMATIC FEVER, HX OF  . DIVERTICULITIS, HX OF  . Sinusitis   Past Medical History  Diagnosis Date  . Cancer of prostate     pT2c No Mx, Gleason 3+4=7  . Diverticulitis   . Kidney stones   . Rheumatic fever 1952-1953  . Basal cell carcinoma 2009  back  . Hypogonadism male   . Nephrolithiasis    Past Surgical History  Procedure Date  . Rotator cuff surgery 2002    Gioffre  . Knee surgery 2005    Duda- arthroscopy, partial medial menisectomy  . Prostate surgery 12/19/06    robotic prostatectomy   History  Substance Use Topics  . Smoking status: Former Smoker    Quit date: 09/25/1971  . Smokeless tobacco: Never Used  . Alcohol Use: No   Family History  Problem Relation Age of Onset  . Breast cancer Mother   . Cancer Brother 51    brain tumor  . Heart disease      fam hx  . Arthritis Other     other relative  . Prostate cancer Other     nephew  . Lung cancer Other 71    nephew   No Known Allergies Current Outpatient Prescriptions on File Prior to Visit  Medication Sig Dispense Refill  . aspirin 81 MG tablet Take 81 mg by mouth once.            Review of Systems General: Denies fever, chills, sweats. No significant weight loss. Eyes: Denies blurring,significant itching ENT: Denies earache, sore throat, and hoarseness. Cardiovascular: Denies chest pains, palpitations, dyspnea on exertion Respiratory: Denies cough, dyspnea at rest,wheeezing Breast: no concerns about lumps GI: hemorrhoids occ GU: Denies penile discharge, ED, urinary flow / outflow problems. No STD concerns. Musculoskeletal: above Derm: Denies rash, itching Neuro: Denies  paresthesias, frequent falls, frequent headaches Psych: Denies depression, anxiety Endocrine: Denies cold intolerance, heat intolerance, polydipsia Heme: Denies enlarged lymph nodes Allergy: No hayfever     Objective:   Physical Exam   Physical Exam  Blood pressure 100/60, pulse 60, temperature 98 F (36.7 C), temperature source Oral, height 5\' 11"  (1.803 m), weight 174 lb 6.4 oz (79.107 kg), SpO2 98.00%.  PE: GEN: well developed, well nourished, no acute distress Eyes: conjunctiva and lids normal, PERRLA, EOMI ENT: TM clear, nares clear, oral exam WNL Neck: supple, no lymphadenopathy, no thyromegaly, no JVD Pulm: clear to auscultation and percussion, respiratory effort normal CV: regular rate and rhythm, S1-S2, no murmur, rub or gallop, no bruits, peripheral pulses normal and symmetric, no cyanosis, clubbing, edema or varicosities Chest: no scars, masses, no gynecomastia   GI: soft, non-tender; no hepatosplenomegaly, masses; active bowel sounds all quadrants GU: no hernia, testicular mass, penile discharge, priapism Lymph: no cervical, axillary or inguinal adenopathy MSK: gait normal, muscle tone and strength WNL, no joint swelling, effusions, discoloration, crepitus  SKIN: clear, good turgor, color WNL, no rashes, lesions, or ulcerations Neuro: normal mental status, normal strength, sensation, and motion Psych: alert; oriented to person, place and time, normally interactive and  not anxious or depressed in appearance.  Shoulder: L Inspection: No muscle wasting or winging Ecchymosis/edema: neg  AC joint, scapula, clavicle: NT Cervical spine: NT, full ROM Spurling's: neg Abduction: full, 5/5 Flexion: full, 5/5 IR, full, lift-off: 5/5 ER at neutral: full, 4/5 AC crossover: pos Neer: pos, mild Hawkins: pos, mild Drop Test: neg Empty Can: neg Supraspinatus insertion: NT Bicipital groove: NT Speed's: neg Yergason's: neg Sulcus sign: neg Scapular dyskinesis: none C5-T1 intact  Neuro: Sensation intact Grip 5/5       Assessment & Plan:   1. Routine general medical examination at a health care facility   2. Left shoulder pain      Medicare Wellness:  The patient's preventative maintenance and recommended screening tests for an annual wellness exam  were reviewed in full today. Brought up to date unless services declined.  Counselled on the importance of diet, exercise, and its role in overall health and mortality. The patient's FH and SH was reviewed, including their home life, tobacco status, and drug and alcohol status.   I have personally reviewed the Medicare Annual Wellness questionnaire and have noted 1. The patient's medical and social history 2. Their use of alcohol, tobacco or illicit drugs 3. Their current medications and supplements 4. The patient's functional ability including ADL's, fall risks, home safety risks and hearing or visual             impairment. 5. Diet and physical activities 6. Evidence for depression or mood disorders  The patients weight, height, BMI and visual acuity have been recorded in the chart I have made referrals, counseling and provided education to the patient based review of the above and I have provided the pt with a written personalized care plan for preventive services.  I have provided the patient with a copy of your personalized plan for preventive services. Instructed to take the time to review along  with their updated medication list.   L shoulder pain: probable partial thickness cuff injury 5-6 months ago, has been doing RTC rehab. Not doing well. I counselled him that I think it highly unlikely he has a full thickness cuff tear and most of these do well with conservative care. He would like a surgical consult, which is reasonable, and I recommended that he see Dr. Dion Saucier for his expertise.

## 2011-10-01 DIAGNOSIS — Z85828 Personal history of other malignant neoplasm of skin: Secondary | ICD-10-CM | POA: Diagnosis not present

## 2011-10-01 DIAGNOSIS — D239 Other benign neoplasm of skin, unspecified: Secondary | ICD-10-CM | POA: Diagnosis not present

## 2011-10-08 DIAGNOSIS — M67919 Unspecified disorder of synovium and tendon, unspecified shoulder: Secondary | ICD-10-CM | POA: Diagnosis not present

## 2011-10-08 DIAGNOSIS — M719 Bursopathy, unspecified: Secondary | ICD-10-CM | POA: Diagnosis not present

## 2011-11-06 ENCOUNTER — Encounter: Payer: Self-pay | Admitting: Family Medicine

## 2011-11-06 ENCOUNTER — Ambulatory Visit (INDEPENDENT_AMBULATORY_CARE_PROVIDER_SITE_OTHER): Payer: Medicare Other | Admitting: Family Medicine

## 2011-11-06 DIAGNOSIS — J40 Bronchitis, not specified as acute or chronic: Secondary | ICD-10-CM

## 2011-11-06 MED ORDER — AZITHROMYCIN 250 MG PO TABS: 250.0000 mg | ORAL_TABLET | Freq: Every day | ORAL | Status: AC

## 2011-11-06 MED ORDER — ALBUTEROL SULFATE HFA 108 (90 BASE) MCG/ACT IN AERS: 2.0000 | INHALATION_SPRAY | Freq: Four times a day (QID) | RESPIRATORY_TRACT | Status: DC | PRN

## 2011-11-06 NOTE — Progress Notes (Signed)
duration of symptoms: 2 weeks Rhinorrhea: yes, discolored rhinorrhea congestion:yes ear pain: no pain but stuffy sore throat: scratchy Cough:yes, with sputum, discolored Myalgias: no other concerns: wheeze noted along with postnasal gtt.  Sx are not improved recently.  No fevers known, possibly early in process- early 2/13.   Taking otc cough meds along with some episodic afrin.    ROS: See HPI.  Otherwise negative.    Meds, vitals, and allergies reviewed.   GEN: nad, alert and oriented HEENT: mucous membranes moist, TM w/o erythema, nasal epithelium injected, OP with cobblestoning, frontal sinus ttp x2 NECK: supple w/o LA CV: rrr. PULM: coarse bs with occ ronchi but  no inc wob ABD: soft, +bs EXT: no edema

## 2011-11-06 NOTE — Patient Instructions (Signed)
Start the antibiotics today and use the inhaler as needed.  This should gradually improve.

## 2011-11-07 DIAGNOSIS — J40 Bronchitis, not specified as acute or chronic: Secondary | ICD-10-CM | POA: Insufficient documentation

## 2011-11-07 NOTE — Assessment & Plan Note (Signed)
Nontoxic, supportive tx and start zmax with SABA prn.  Okay for outpatient f/u.  He agrees.

## 2011-11-13 DIAGNOSIS — L570 Actinic keratosis: Secondary | ICD-10-CM | POA: Diagnosis not present

## 2011-12-05 ENCOUNTER — Telehealth: Payer: Self-pay | Admitting: Family Medicine

## 2011-12-05 NOTE — Telephone Encounter (Signed)
Triage Record Num: [id number] Operator: Patriciaann Clan Patient Name: Derek [name] Call Date & Time: 12/04/2011 5:04:13PM Patient Phone: [phone] PCP: Hannah Beat Patient Gender: Male PCP Fax : [phone] Patient DOB: [birth year] Practice Name: [facility] Reason for Call: Caller: Arlen/Patient; PCP: Juleen China.; CB#: [phone]; ; ; Call regarding Cough/Congestion; Patient states he was seen in office, by Dr. Para March, and dx with Bronchitis approx. 4 weeks ago and prescribed ZPack and Ventolin Inhaler. States sx improved but did not resolve. States increased cough and congestion, onset X 10 days. Afebrile. States expectorating yellow sputum. Triage per URI Protocol. No emergent sx identified. Care advice given per guidelines. Patient advised increased fluids, warm fluids with honey, inhaled steam, humidifier. Advised may use OTC Mucinex. Call back parameters reviewed. Patient verbalizes understanding. No appts. available in Epic 12/05/11. Patient advised to call office 12/05/11 0800 for appt. Patient agreeable. Protocol(s) Used: Upper Respiratory Infection (URI) Recommended Outcome per Protocol: See Provider within 24 hours Reason for Outcome: Productive cough with colored sputum (other than clear or white sputum) Care Advice: ~ Use a cool mist humidifier to moisten air. Be sure to clean according to manufacturer's instructions. ~ May inhale steam from hot shower or heated water. Be careful to avoid burns. Limit or avoid exposure to irritants and allergens (e.g. air pollution, smoke/smoking, chemicals, dust, pollen, pet dander, etc.) ~ Increase fluids to 8-12 eight oz (1.6 to 2.4 liters) glasses per day, half of them to be water. Soups, popsicles, fruit juices, non-caffeinated sodas (unless restricting sodium intake), jello, broths, decaf teas, etc. are all okay. Warm fluids can be soothing. ~ ~ Warm fluids may help, or try a mixture of honey and lemon  juice in warm tea. ~ SYMPTOM / CONDITION MANAGEMENT Coughing up mucus or phlegm helps to get rid of an infection. A productive cough should not be stopped. A cough medicine with guaifenesin (Robitussin, Mucinex) can help loosen the mucus. Cough medicine with dextromethorphan (DM) should be avoided. Drinking lots of fluids can help loosen the mucus too, especially warm fluids. ~ Call provider if has a fever over 101.5 F (38.6 C) that has not responded to home care measures, having shaking chills or any fever in someone immunocompromised/frail elderly. ~ 12/04/2011 5:24:20PM Page 1 of 1 [email or web address]

## 2012-02-20 DIAGNOSIS — C61 Malignant neoplasm of prostate: Secondary | ICD-10-CM | POA: Diagnosis not present

## 2012-02-26 DIAGNOSIS — N529 Male erectile dysfunction, unspecified: Secondary | ICD-10-CM | POA: Diagnosis not present

## 2012-02-26 DIAGNOSIS — C61 Malignant neoplasm of prostate: Secondary | ICD-10-CM | POA: Diagnosis not present

## 2012-06-24 ENCOUNTER — Ambulatory Visit (INDEPENDENT_AMBULATORY_CARE_PROVIDER_SITE_OTHER): Payer: Medicare Other | Admitting: Surgery

## 2012-06-24 ENCOUNTER — Encounter (INDEPENDENT_AMBULATORY_CARE_PROVIDER_SITE_OTHER): Payer: Self-pay | Admitting: Surgery

## 2012-06-24 VITALS — BP 124/78 | HR 55 | Temp 98.0°F | Resp 18 | Ht 71.0 in | Wt 174.6 lb

## 2012-06-24 DIAGNOSIS — K4090 Unilateral inguinal hernia, without obstruction or gangrene, not specified as recurrent: Secondary | ICD-10-CM

## 2012-06-24 DIAGNOSIS — K4020 Bilateral inguinal hernia, without obstruction or gangrene, not specified as recurrent: Secondary | ICD-10-CM | POA: Insufficient documentation

## 2012-06-24 NOTE — Patient Instructions (Addendum)
See the Handout(s) we gave you.  Consider surgery.  Please call our office at [phone] if you wish to schedule surgery or if you have further questions / concerns.   Hernia A hernia occurs when an internal organ pushes out through a weak spot in the abdominal wall. Hernias most commonly occur in the groin and around the navel. Hernias often can be pushed back into place (reduced). Most hernias tend to get worse over time. Some abdominal hernias can get stuck in the opening (irreducible or incarcerated hernia) and cannot be reduced. An irreducible abdominal hernia which is tightly squeezed into the opening is at risk for impaired blood supply (strangulated hernia). A strangulated hernia is a medical emergency. Because of the risk for an irreducible or strangulated hernia, surgery may be recommended to repair a hernia. CAUSES   Heavy lifting.  Prolonged coughing.  Straining to have a bowel movement.  A cut (incision) made during an abdominal surgery. HOME CARE INSTRUCTIONS   Bed rest is not required. You may continue your normal activities.  Avoid lifting more than 10 pounds (4.5 kg) or straining.  Cough gently. If you are a smoker it is best to stop. Even the best hernia repair can break down with the continual strain of coughing. Even if you do not have your hernia repaired, a cough will continue to aggravate the problem.  Do not wear anything tight over your hernia. Do not try to keep it in with an outside bandage or truss. These can damage abdominal contents if they are trapped within the hernia sac.  Eat a normal diet.  Avoid constipation. Straining over long periods of time will increase hernia size and encourage breakdown of repairs. If you cannot do this with diet alone, stool softeners may be used. SEEK IMMEDIATE MEDICAL CARE IF:   You have a fever.  You develop increasing abdominal pain.  You feel nauseous or vomit.  Your hernia is stuck outside the abdomen, looks  discolored, feels hard, or is tender.  You have any changes in your bowel habits or in the hernia that are unusual for you.  You have increased pain or swelling around the hernia.  You cannot push the hernia back in place by applying gentle pressure while lying down. MAKE SURE YOU:   Understand these instructions.  Will watch your condition.  Will get help right away if you are not doing well or get worse. Document Released: 09/10/2005 Document Revised: 12/03/2011 Document Reviewed: 04/29/2008 Exit[employer] Patient Information 2013 [employer].  

## 2012-06-24 NOTE — Progress Notes (Signed)
Subjective:     Patient ID: Derek [name], male   DOB: 1945-12-19, 66 y.o.   MRN: [id number]  HPI  Derek [name]  1946/02/23 [id number]  Patient Care Team: Derek Beat, MD as PCP - General Derek Reasons, MD as Consulting Physician (Gastroenterology) Derek Mc, MD as Consulting Physician (Urology) Derek Amis, MD as Consulting Physician (Dermatology) Derek [name]. Derek Riggs, MD as Consulting Physician (Ophthalmology) Derek Crape, MD as Consulting Physician (Ophthalmology) Derek Post, MD as Consulting Physician (Orthopedic Surgery)  This patient is a 66 y.o.male who presents today for surgical evaluation at the request of Dr. Laverle [name].   Reason for evaluation: Right groin swelling.  Probable inguinal hernia.  Pleasant active male.  Runs marathons.  Noted right groin pain and a bulge after some heavy activity.  Pain is less but he is afraid to do more intense activity.  Size urologist.  Concern of hernia.  Sent to me for surgical evaluation.  No history of falls or trauma.  Has regular bowel movements.  No skin infections.  Had a laparoscopic robotic prostatectomy five years ago.  No evidence of recurrent disease.  Noted some occasional abdominal discomfort in the mid region.  Not severe.  No hematochezia.  No melena.  No regular bowel habits.  Daily bowel movements.  No nausea or vomiting.  No alcohol intake.No personal nor family history of GI/colon cancer, inflammatory bowel disease, irritable bowel syndrome, allergy such as Celiac Sprue, dietary/dairy problems, colitis, ulcers nor gastritis.  No recent sick contacts/gastroenteritis.  No travel outside the country.  No changes in diet.  Normal colonoscopy by Dr. Matthias [name] nine years ago.  Due for followup next year    Patient Active Problem List  Diagnosis  . OTHER MALAISE AND FATIGUE  . PROSTATE CANCER, HX OF  . CARCINOMA, BASAL CELL, HX OF  . RHEUMATIC FEVER, HX OF  . DIVERTICULITIS, HX OF  . Bronchitis  . Inguinal  hernia, right    Past Medical History  Diagnosis Date  . Cancer of prostate     pT2c No Mx, Gleason 3+4=7  . Diverticulitis   . Kidney stones   . Rheumatic fever 1952-1953  . Basal cell carcinoma 2009    back  . Hypogonadism male   . Nephrolithiasis     Past Surgical History  Procedure Date  . Rotator cuff surgery 2002    Gioffre  . Knee surgery 2005    Duda- arthroscopy, partial medial menisectomy  . Prostate surgery 12/19/06    robotic prostatectomy    History   Social History  . Marital Status: Married    Spouse Name: N/A    Number of Children: N/A  . Years of Education: N/A   Occupational History  . retired Systems analyst) Herbalist   Social History Main Topics  . Smoking status: Former Smoker    Quit date: 09/25/1971  . Smokeless tobacco: Never Used  . Alcohol Use: No  . Drug Use: No  . Sexually Active: Not on file   Other Topics Concern  . Not on file   Social History Narrative   Regular exercise: yesRunner, former marathon runner    Family History  Problem Relation Age of Onset  . Breast cancer Mother   . Cancer Brother 51    brain tumor  . Heart disease      fam hx  . Arthritis Other     other relative  . Prostate cancer Other  nephew  . Lung cancer Other 3    nephew    Current Outpatient Prescriptions  Medication Sig Dispense Refill  . aspirin 81 MG tablet Take 81 mg by mouth once.        . multivitamin-iron-minerals-folic acid (CENTRUM) chewable tablet Chew 1 tablet by mouth daily.      Marland Kitchen albuterol (PROVENTIL HFA;VENTOLIN HFA) 108 (90 BASE) MCG/ACT inhaler Inhale 2 puffs into the lungs every 6 (six) hours as needed for wheezing.  1 Inhaler  1     No Known Allergies  BP 124/78  Pulse 55  Temp 98 F (36.7 C) (Temporal)  Resp 18  Ht 5\' 11"  (1.803 m)  Wt 174 lb 9.6 oz (79.198 kg)  BMI 24.35 kg/m2  SpO2 98%  No results found.   Review of Systems  Constitutional: Negative for fever, chills and diaphoresis.    HENT: Negative for nosebleeds, sore throat, facial swelling, mouth sores, trouble swallowing and ear discharge.   Eyes: Negative for photophobia, discharge and visual disturbance.  Respiratory: Negative for choking, chest tightness, shortness of breath and stridor.   Cardiovascular: Negative for chest pain and palpitations.  Gastrointestinal: Negative for nausea, vomiting, abdominal pain, diarrhea, constipation, blood in stool, abdominal distention, anal bleeding and rectal pain.  Genitourinary: Negative for dysuria, urgency, difficulty urinating and testicular pain.  Musculoskeletal: Negative for myalgias, back pain, arthralgias and gait problem.  Skin: Negative for color change, pallor, rash and wound.  Neurological: Negative for dizziness, speech difficulty, weakness, numbness and headaches.  Hematological: Negative for adenopathy. Does not bruise/bleed easily.  Psychiatric/Behavioral: Negative for hallucinations, confusion and agitation.       Objective:   Physical Exam  Constitutional: He is oriented to person, place, and time. He appears well-developed and well-nourished. No distress.  HENT:  Head: Normocephalic.  Mouth/Throat: Oropharynx is clear and moist. No oropharyngeal exudate.  Eyes: Conjunctivae normal and EOM are normal. Pupils are equal, round, and reactive to light. No scleral icterus.  Neck: Normal range of motion. Neck supple. No tracheal deviation present.  Cardiovascular: Normal rate, regular rhythm and intact distal pulses.   Pulmonary/Chest: Effort normal and breath sounds normal. No respiratory distress.  Abdominal: Soft. He exhibits no distension, no pulsatile liver, no ascites and no mass. There is no tenderness. There is no rigidity, no guarding and no CVA tenderness. A hernia is present. Hernia confirmed positive in the right inguinal area. Hernia confirmed negative in the ventral area and confirmed negative in the left inguinal area.    Musculoskeletal:  Normal range of motion. He exhibits no tenderness.  Lymphadenopathy:    He has no cervical adenopathy.       Right: No inguinal adenopathy present.       Left: No inguinal adenopathy present.  Neurological: He is alert and oriented to person, place, and time. No cranial nerve deficit. He exhibits normal muscle tone. Coordination normal.  Skin: Skin is warm and dry. No rash noted. He is not diaphoretic. No erythema. No pallor.  Psychiatric: He has a normal mood and affect. His behavior is normal. Judgment and thought content normal.       Assessment:     RIH     Plan:     Lap exploration & repair:  The anatomy & physiology of the abdominal wall and pelvic floor was discussed.  The pathophysiology of hernias in the inguinal and pelvic region was discussed.  Natural history risks such as progressive enlargement, pain, incarceration & strangulation was discussed.  Contributors to complications such as smoking, obesity, diabetes, prior surgery, etc were discussed.    I feel the risks of no intervention will lead to serious problems that outweigh the operative risks; therefore, I recommended surgery to reduce and repair the hernia.  I explained laparoscopic techniques with possible need for an open approach.  I noted usual use of mesh to patch and/or buttress hernia repair  Risks such as bleeding, infection, abscess, need for further treatment, heart attack, death, and other risks were discussed.  I noted a good likelihood this will help address the problem.   Goals of [name]-operative recovery were discussed as well.  Possibility that this will not correct all symptoms was explained.  I stressed the importance of low-impact activity, aggressive pain control, avoiding constipation, & not pushing through pain to minimize risk of [name]-operative chronic pain or injury. Possibility of reherniation was discussed.  We will work to minimize complications.     An educational handout further explaining the  pathology & treatment options was given as well.  Questions were answered.  The patient expresses understanding & wishes to proceed with surgery.

## 2012-07-10 DIAGNOSIS — K4020 Bilateral inguinal hernia, without obstruction or gangrene, not specified as recurrent: Secondary | ICD-10-CM | POA: Diagnosis not present

## 2012-07-10 DIAGNOSIS — D176 Benign lipomatous neoplasm of spermatic cord: Secondary | ICD-10-CM | POA: Diagnosis not present

## 2012-07-10 HISTORY — PX: OTHER SURGICAL HISTORY: SHX169

## 2012-07-10 HISTORY — PX: HERNIA REPAIR: SHX51

## 2012-07-15 ENCOUNTER — Telehealth (INDEPENDENT_AMBULATORY_CARE_PROVIDER_SITE_OTHER): Payer: Self-pay | Admitting: General Surgery

## 2012-07-15 NOTE — Telephone Encounter (Signed)
Patient called stating that he is s/p RIH on 10/17 by dr.gross and that for the past 2 days everytime that he gets up from sitting down that he feels a little nauseated for just a second when he stands up and then it goes away...after talking with Huntley Dec I instructed the patient to keep ice on groin area and to keep it elevated when sitting and to take it easy- to get up nice and slow when standing...patient was fine with instructions and has follow up appt on 10/30 with dr.gross..I also instructed patient to call us back if any other questions or concerns arise before or after hours and he was fine with this

## 2012-07-23 ENCOUNTER — Ambulatory Visit (INDEPENDENT_AMBULATORY_CARE_PROVIDER_SITE_OTHER): Payer: Medicare Other | Admitting: Surgery

## 2012-07-23 ENCOUNTER — Encounter (INDEPENDENT_AMBULATORY_CARE_PROVIDER_SITE_OTHER): Payer: Self-pay | Admitting: Surgery

## 2012-07-23 VITALS — BP 116/60 | HR 56 | Temp 97.2°F | Resp 16 | Ht 71.0 in | Wt 175.4 lb

## 2012-07-23 DIAGNOSIS — K4020 Bilateral inguinal hernia, without obstruction or gangrene, not specified as recurrent: Secondary | ICD-10-CM

## 2012-07-23 NOTE — Progress Notes (Signed)
Subjective:     Patient ID: Derek [name], male   DOB: 1946-08-30, 66 y.o.   MRN: [id number]  HPI  Derek [name]  18-Oct-1945 [id number]  Patient Care Team: Hannah Beat, MD as PCP - General Florencia Reasons, MD as Consulting Physician (Gastroenterology) Crecencio Mc, MD as Consulting Physician (Urology) Wonda Amis, MD as Consulting Physician (Dermatology) Marcelyn Bruins. Nile Riggs, MD as Consulting Physician (Ophthalmology) Edmon Crape, MD as Consulting Physician (Ophthalmology) Eulas Post, MD as Consulting Physician (Orthopedic Surgery)  This patient is a 66 y.o.male who presents today for surgical evaluation Status post laparoscopic repair of bilateral inguinal hernias 07/06/2012.   Patient comes in today feeling well.  Had moderate bruising but that has resolved.  Off narcotics in two days.  Off nonsteroidals in three days.  Restarted for a few days last week.  Eating well.  Walking fine.  No fevers or chills.  Walking over an hour a day now.  Patient Active Problem List  Diagnosis  . OTHER MALAISE AND FATIGUE  . PROSTATE CANCER, HX OF  . CARCINOMA, BASAL CELL, HX OF  . RHEUMATIC FEVER, HX OF  . DIVERTICULITIS, HX OF  . Bronchitis  . Bilateral inguinal hernia (BIH)    Past Medical History  Diagnosis Date  . Cancer of prostate     pT2c No Mx, Gleason 3+4=7  . Diverticulitis   . Kidney stones   . Rheumatic fever 1952-1953  . Basal cell carcinoma 2009    back  . Hypogonadism male   . Nephrolithiasis     Past Surgical History  Procedure Date  . Rotator cuff surgery 2002    Gioffre  . Knee surgery 2005    Duda- arthroscopy, partial medial menisectomy  . Prostate surgery 12/19/06    robotic prostatectomy    History   Social History  . Marital Status: Married    Spouse Name: N/A    Number of Children: N/A  . Years of Education: N/A   Occupational History  . retired Systems analyst) Herbalist   Social History Main Topics  . Smoking status:  Former Smoker    Quit date: 09/25/1971  . Smokeless tobacco: Never Used  . Alcohol Use: No  . Drug Use: No  . Sexually Active: Not on file   Other Topics Concern  . Not on file   Social History Narrative   Regular exercise: yesRunner, former marathon runner    Family History  Problem Relation Age of Onset  . Breast cancer Mother   . Cancer Brother 51    brain tumor  . Heart disease      fam hx  . Arthritis Other     other relative  . Prostate cancer Other     nephew  . Lung cancer Other 15    nephew    Current Outpatient Prescriptions  Medication Sig Dispense Refill  . albuterol (PROVENTIL HFA;VENTOLIN HFA) 108 (90 BASE) MCG/ACT inhaler Inhale 2 puffs into the lungs every 6 (six) hours as needed for wheezing.  1 Inhaler  1  . multivitamin-iron-minerals-folic acid (CENTRUM) chewable tablet Chew 1 tablet by mouth daily.      Marland Kitchen aspirin 81 MG tablet Take 81 mg by mouth once.           No Known Allergies  BP 116/60  Pulse 56  Temp 97.2 F (36.2 C) (Temporal)  Resp 16  Ht 5\' 11"  (1.803 m)  Wt 175 lb 6.4 oz (79.561 kg)  BMI 24.46 kg/m2  No results found.   Review of Systems  Constitutional: Negative for fever, chills and diaphoresis.  HENT: Negative for sore throat, trouble swallowing and neck pain.   Eyes: Negative for photophobia and visual disturbance.  Respiratory: Negative for choking and shortness of breath.   Cardiovascular: Negative for chest pain and palpitations.  Gastrointestinal: Negative for nausea, vomiting, abdominal distention, anal bleeding and rectal pain.  Genitourinary: Negative for dysuria, urgency, difficulty urinating and testicular pain.  Musculoskeletal: Negative for myalgias, arthralgias and gait problem.  Skin: Negative for color change and rash.  Neurological: Negative for dizziness, speech difficulty, weakness and numbness.  Hematological: Negative for adenopathy.  Psychiatric/Behavioral: Negative for hallucinations, confusion and  agitation.       Objective:   Physical Exam  Constitutional: He is oriented to person, place, and time. He appears well-developed and well-nourished. No distress.  HENT:  Head: Normocephalic.  Mouth/Throat: Oropharynx is clear and moist. No oropharyngeal exudate.  Eyes: Conjunctivae normal and EOM are normal. Pupils are equal, round, and reactive to light. No scleral icterus.  Neck: Normal range of motion. No tracheal deviation present.  Cardiovascular: Normal rate, normal heart sounds and intact distal pulses.   Pulmonary/Chest: Effort normal. No respiratory distress.  Abdominal: Soft. He exhibits no distension. There is no tenderness. Hernia confirmed negative in the right inguinal area and confirmed negative in the left inguinal area.       Incisions clean with normal healing ridges.  No hernias  Musculoskeletal: Normal range of motion. He exhibits no tenderness.  Neurological: He is alert and oriented to person, place, and time. No cranial nerve deficit. He exhibits normal muscle tone. Coordination normal.  Skin: Skin is warm and dry. No rash noted. He is not diaphoretic.  Psychiatric: He has a normal mood and affect. His behavior is normal.       Assessment:     Less than two weeks status post laparoscopic bilateral inguinal hernia repairs.  Recovering well.    Plan:     Increase activity as tolerated to regular activity.  Do not push through pain.  Diet as tolerated. Bowel regimen to avoid problems.  Return to clinic p.r.n.   Instructions discussed.  Followup with primary care physician for other health issues as would normally be done.  Questions answered.  The patient expressed understanding and appreciation

## 2012-07-23 NOTE — Patient Instructions (Addendum)
Managing Pain  Pain after surgery or related to activity is often due to strain/injury to muscle, tendon, nerves and/or incisions.  This pain is usually short-term and will improve in a few months.   Many people find it helpful to do the following things TOGETHER to help speed the process of healing and to get back to regular activity more quickly:  1. Avoid heavy physical activity a.  no lifting greater than 20 pounds b. Do not "push through" the pain.  Listen to your body and avoid positions and maneuvers than reproduce the pain c. Walking is okay as tolerated, but go slowly and stop when getting sore.  d. Remember: If it hurts to do it, then don't do it! 2. Take Anti-inflammatory medication  a. Take with food/snack around the clock for 1-2 weeks i. This helps the muscle and nerve tissues become less irritable and calm down faster b. Choose ONE of the following over-the-counter medications: i. Naproxen 220mg tabs (ex. Aleve) 1-2 pills twice a day  ii. Ibuprofen 200mg tabs (ex. Advil, Motrin) 3-4 pills with every meal and just before bedtime iii. Acetaminophen 500mg tabs (Tylenol) 1-2 pills with every meal and just before bedtime 3. Use a Heating pad or Ice/Cold Pack a. 4-6 times a day b. May use warm bath/hottub  or showers 4. Try Gentle Massage and/or Stretching  a. at the area of pain many times a day b. stop if you feel pain - do not overdo it  Try these steps together to help you body heal faster and avoid making things get worse.  Doing just one of these things may not be enough.    If you are not getting better after two weeks or are noticing you are getting worse, contact our office for further advice; we may need to re-evaluate you & see what other things we can do to help.  Hernia, Surgical Repair Care After Refer to this sheet in the next few weeks. These discharge instructions provide you with general information on caring for yourself after you leave the hospital. Your  caregiver may also give you specific instructions. Your treatment has been planned according to the most current medical practices available, but unavoidable complications sometimes occur. If you have any problems or questions after discharge, please call your caregiver. HOME CARE INSTRUCTIONS   It is normal to be sore for a couple weeks after surgery. See your caregiver if this seems to be getting worse rather than better.  Put ice on the operative site.  Put ice in a plastic bag.  Place a towel between your skin and the bag.  Leave the ice on for 15 to 20 minutes at a time, 3 to 4 times a day for the first 2 days.  Change bandages (dressings) as directed.  Keep the wound dry and clean. The wound may be washed gently with soap and water. Gently blot or dab the wound dry. Do not take baths, use swimming pools, or use hot tubs for 10 days, or as directed by your caregiver.  Only take over-the-counter or prescription medicines for pain, discomfort, or fever as directed by your caregiver.  Continue your normal diet as directed.  Do not drive until your caregiver says it is okay.  Do not lift anything more than 10 pounds or play contact sports for 3 weeks, or as directed.  Make an appointment to see your caregiver for stitches (sutures) or staple removal when instructed. SEEK MEDICAL CARE IF:   You   have increased bleeding coming from the wounds.  You have blood in your stool.  You see redness, swelling, or have increasing pain in the wounds.  You have fluid (pus) coming from the wound.  You have an oral temperature above 102 F (38.9 C).  You notice a bad smell coming from the wound or dressing.  You develop lightheadedness or feel faint. SEEK IMMEDIATE MEDICAL CARE IF:   You develop a rash.  You have difficulty breathing.  You develop any reaction or side effects to medicines given. MAKE SURE YOU:   Understand these instructions.  Will watch your condition.  Will  get help right away if you are not doing well or get worse. Document Released: 03/30/2005 Document Revised: 12/03/2011 Document Reviewed: 08/10/2009 Exit[employer] Patient Information 2013 [employer].  

## 2012-08-04 ENCOUNTER — Encounter (INDEPENDENT_AMBULATORY_CARE_PROVIDER_SITE_OTHER): Payer: Medicare Other | Admitting: Surgery

## 2012-09-04 ENCOUNTER — Encounter (INDEPENDENT_AMBULATORY_CARE_PROVIDER_SITE_OTHER): Payer: Self-pay

## 2012-09-04 NOTE — Progress Notes (Signed)
Pt walked in and is s/p bilateral inguinal hernia repair.  He states when he walks for about 30-45 mins at a time, his scrotum turns dark.  It has been doing this since surgery.  He is urinating fine and there is no swelling.  I asked if his scrotum rubs against anything out of the ordinary and he said no.  His incisions are healed.  Since this has been going on since surgery, I brought him in Monday to see Dr Michaell Cowing.  I did not see any other time available before Christmas.

## 2012-09-08 ENCOUNTER — Ambulatory Visit (INDEPENDENT_AMBULATORY_CARE_PROVIDER_SITE_OTHER): Payer: Medicare Other | Admitting: Surgery

## 2012-09-08 ENCOUNTER — Encounter (INDEPENDENT_AMBULATORY_CARE_PROVIDER_SITE_OTHER): Payer: Self-pay | Admitting: Surgery

## 2012-09-08 VITALS — BP 110/72 | HR 60 | Temp 97.2°F | Resp 18 | Ht 71.0 in | Wt 178.0 lb

## 2012-09-08 DIAGNOSIS — K4020 Bilateral inguinal hernia, without obstruction or gangrene, not specified as recurrent: Secondary | ICD-10-CM

## 2012-09-08 DIAGNOSIS — IMO0002 Reserved for concepts with insufficient information to code with codable children: Secondary | ICD-10-CM

## 2012-09-08 DIAGNOSIS — Q559 Congenital malformation of male genital organ, unspecified: Secondary | ICD-10-CM | POA: Insufficient documentation

## 2012-09-08 DIAGNOSIS — Q5520 Unspecified congenital malformations of testis and scrotum: Secondary | ICD-10-CM | POA: Insufficient documentation

## 2012-09-08 NOTE — Progress Notes (Signed)
Subjective:     Patient ID: Derek [name], male   DOB: 02/25/1946, 66 y.o.   MRN: [id number]  HPI   Derek [name]  04/01/1946 [id number]  Patient Care Team: Hannah Beat, MD as PCP - General Florencia Reasons, MD as Consulting Physician (Gastroenterology) Crecencio Mc, MD as Consulting Physician (Urology) Wonda Amis, MD as Consulting Physician (Dermatology) Marcelyn Bruins. Nile Riggs, MD as Consulting Physician (Ophthalmology) Edmon Crape, MD as Consulting Physician (Ophthalmology) Eulas Post, MD as Consulting Physician (Orthopedic Surgery)  This patient is a 66 y.o.male who presents today for surgical evaluation Status post laparoscopic repair of bilateral inguinal hernias 07/06/2012.   Patient comes in today concerned.  Had moderate bruising for a week but that resolved.  Off narcotics in two days.  Off nonsteroidals in three days.  Eating well.  Walking fine.  No fevers or chills.  Walking over an hour a day now.     The patient notes that his scrotum will get very dark purple/black if he is active her exercises after an hour or so.  Gradually fades away.  No pain.  No edema.  No difficulty with erection or ejaculation.  No fevers chills or sweats.  It concerned him.  Eventually the color comes back to its usual mild darkness.  It concerned him.  We fit him in today.  Patient Active Problem List  Diagnosis  . OTHER MALAISE AND FATIGUE  . PROSTATE CANCER, HX OF  . CARCINOMA, BASAL CELL, HX OF  . RHEUMATIC FEVER, HX OF  . DIVERTICULITIS, HX OF  . Bronchitis  . Bilateral inguinal hernia (BIH) s/p lap repair 07/06/2012    Past Medical History  Diagnosis Date  . Cancer of prostate     pT2c No Mx, Gleason 3+4=7  . Diverticulitis   . Kidney stones   . Rheumatic fever 1952-1953  . Basal cell carcinoma 2009    back  . Hypogonadism male   . Nephrolithiasis     Past Surgical History  Procedure Date  . Rotator cuff surgery 2002    Gioffre  . Knee surgery 2005    Duda-  arthroscopy, partial medial menisectomy  . Prostate surgery 12/19/06    robotic prostatectomy  . Bilateral inguinal hernia repair 07/10/2012    lap BIH repairs  . Hernia repair 07/10/12    [facility]    History   Social History  . Marital Status: Married    Spouse Name: N/A    Number of Children: N/A  . Years of Education: N/A   Occupational History  . retired Systems analyst) Herbalist   Social History Main Topics  . Smoking status: Former Smoker    Quit date: 09/25/1971  . Smokeless tobacco: Never Used  . Alcohol Use: No  . Drug Use: No  . Sexually Active: Not on file   Other Topics Concern  . Not on file   Social History Narrative   Regular exercise: yesRunner, former marathon runner    Family History  Problem Relation Age of Onset  . Breast cancer Mother   . Cancer Brother 51    brain tumor  . Heart disease      fam hx  . Arthritis Other     other relative  . Prostate cancer Other     nephew  . Lung cancer Other 69    nephew    Current Outpatient Prescriptions  Medication Sig Dispense Refill  . aspirin 81 MG tablet Take 81  mg by mouth once.        . multivitamin-iron-minerals-folic acid (CENTRUM) chewable tablet Chew 1 tablet by mouth daily.         No Known Allergies  BP 110/72  Pulse 60  Temp 97.2 F (36.2 C) (Oral)  Resp 18  Ht 5\' 11"  (1.803 m)  Wt 178 lb (80.74 kg)  BMI 24.83 kg/m2  No results found.   Review of Systems  Constitutional: Negative for fever, chills and diaphoresis.  HENT: Negative for sore throat, trouble swallowing and neck pain.   Eyes: Negative for photophobia and visual disturbance.  Respiratory: Negative for choking and shortness of breath.   Cardiovascular: Negative for chest pain and palpitations.  Gastrointestinal: Negative for nausea, vomiting, abdominal distention, anal bleeding and rectal pain.  Genitourinary: Negative for dysuria, urgency, frequency, hematuria, decreased urine volume, discharge, penile  swelling, scrotal swelling, difficulty urinating, genital sores, penile pain and testicular pain.  Musculoskeletal: Negative for myalgias, arthralgias and gait problem.  Skin: Negative for color change and rash.  Neurological: Negative for dizziness, speech difficulty, weakness and numbness.  Hematological: Negative for adenopathy.  Psychiatric/Behavioral: Negative for hallucinations, confusion and agitation.       Objective:   Physical Exam  Constitutional: He is oriented to person, place, and time. He appears well-developed and well-nourished. No distress.  HENT:  Head: Normocephalic.  Mouth/Throat: Oropharynx is clear and moist. No oropharyngeal exudate.  Eyes: Conjunctivae normal and EOM are normal. Pupils are equal, round, and reactive to light. No scleral icterus.  Neck: Normal range of motion. No tracheal deviation present.  Cardiovascular: Normal rate, normal heart sounds and intact distal pulses.   Pulmonary/Chest: Effort normal. No respiratory distress.  Abdominal: Soft. He exhibits no distension. There is no tenderness. Hernia confirmed negative in the right inguinal area and confirmed negative in the left inguinal area.       Incisions clean with normal healing ridges.  No hernias  Genitourinary:    Right testis shows no mass, no swelling and no tenderness. Left testis shows no mass, no swelling and no tenderness. Circumcised. No phimosis or paraphimosis.  Musculoskeletal: Normal range of motion. He exhibits no tenderness.  Lymphadenopathy:       Right: No inguinal adenopathy present.       Left: No inguinal adenopathy present.  Neurological: He is alert and oriented to person, place, and time. No cranial nerve deficit. He exhibits normal muscle tone. Coordination normal.  Skin: Skin is warm and dry. No rash noted. He is not diaphoretic.  Psychiatric: He has a normal mood and affect. His behavior is normal.       Assessment:     Two months status post laparoscopic  bilateral inguinal hernia with exercise-induced scrotal darkening of uncertain etiology.      Plan:     I am skeptical anything of major concern is going on.  Nonetheless, the patient is anxious about it.  If he is getting intermittent skin engorgement, that should stabilize and improve in the next few months.  I have never seen nor heard of anything like this before.  I think it is reasonable to get an ultrasound to rule out any varicosities or other abnormalities.  I was able to get all of his urologist, Dr. Crecencio Mc.  In discussions with him, he does not feel any thing of major concern is going on in the absence of any edema, mass, or pain.  Ultrasound seems reasonable place to start.  Certainly, if it  is getting much worse or painful a reevaluation by urology is warranted..  Activity as tolerated to regular activity.  Do not push through pain.  Call after ultrasound done.   Instructions discussed.  Followup with primary care physician for other health issues as would normally be done.  Questions answered.  The patient expressed understanding and appreciation

## 2012-09-08 NOTE — Patient Instructions (Addendum)
Please obtain ultrasound to evaluate blood supply to your testicles in scrotum.  We may have you see urology to for further workup.    I think you may have a Varicocele A varicocele is a swelling of veins in the scrotum (the bag of skin that contains the testicles). It is most common in young men. It occurs most often on the left side. Small or painless varicoceles do not need treatment. Most often, this is not a serious problem, but further tests may be needed to confirm the diagnosis. Surgery may be needed if complications of varicoceles arise. Rarely, varicoceles can reoccur after surgery. CAUSES  The swelling is due to blood backing up in the vein that leads from the testicle back to the body. Blood backs up because the valves inside the vein are not working properly. Veins normally return blood to the heart. Valves in veins are supposed to be one-way valves. They should not allow blood to flow backwards. If the valves do not work well, blood can pool in a vein and make it swell. The same thing happens with varicose veins in the leg. SYMPTOMS  A varicocele most often causes no symptoms. When they occur, symptoms include:   Swelling on one side of the scrotum.  Swelling that is more obvious when standing up.  A lumpy feeling in the scrotum.  Heaviness on one side of the scrotum.  Dull ache in the scrotum, especially after exercise or prolonged standing or sitting.  Slower growth or reduced size of the testicle on the side of the varicocele (in young males).  Problems with fertility can arise if the testicle does not grow normally. DIAGNOSIS  Varicocele is usually diagnosed by a physical exam. Sometimes ultrasonography is done. TREATMENT  Usually, varicoceles need no treatment. They are often routinely monitored on exam by your caregiver to ensure they do not slow the growth of the testicle on that side. Treatment may be needed if:  The varicocele is large.  There is a lot of  pain.  The varicocele causes a decrease in the size of the testicle in a growing adolescent.  The other testicle is absent or not normal.  Varicoceles are found on both sides of the scrotum.  There is pain when exercising.  There are fertility problems. There are two types of treatment:  Surgery. The surgeon ties off the swollen veins. Surgery may be done with an incision in the skin or through a laparoscope. The surgery is usually done in an outpatient setting. Outpatient means there is no overnight stay in a hospital.  Embolization. A small tube is placed in a vein and guided into the swollen veins. X-rays are used to guide the small tube. Tiny metal coils or other blocking items are put through the tube. This blocks swollen veins and the flow of blood. This is usually done in an outpatient setting without the use of general anesthesia. HOME CARE INSTRUCTIONS  To decrease discomfort:  Wear supportive underwear.  Use an athletic supporter for sports.  Only take over-the-counter or prescription medicines for pain or discomfort as directed by your caregiver. SEEK MEDICAL CARE IF:   Pain is increasing.  Swelling does not decrease when lying down.  Testicle is smaller.  The testicle becomes enlarged, swollen, red, or painful. Document Released: 12/17/2000 Document Revised: 12/03/2011 Document Reviewed: 12/21/2009 [facility] Patient Information 2013 [place], [place].

## 2012-09-09 ENCOUNTER — Ambulatory Visit
Admission: RE | Admit: 2012-09-09 | Discharge: 2012-09-09 | Disposition: A | Discharge status: Home / Self Care | Payer: Medicare Other | Source: Ambulatory Visit | Attending: Surgery | Admitting: Surgery

## 2012-09-09 ENCOUNTER — Telehealth (INDEPENDENT_AMBULATORY_CARE_PROVIDER_SITE_OTHER): Payer: Self-pay | Admitting: General Surgery

## 2012-09-09 DIAGNOSIS — N433 Hydrocele, unspecified: Secondary | ICD-10-CM | POA: Diagnosis not present

## 2012-09-09 NOTE — Telephone Encounter (Signed)
Patient made aware [place] is normal. He does not want to wait and see if it will get better. He states he has waited two months and he wants to know if there is anything else we can do see see what is causing this? Please advise.

## 2012-09-09 NOTE — Telephone Encounter (Signed)
Not anything else to do but follow.  It has been only 2 months since surgery.  Dr Laverle Patter, his Urologist, did not sense anything concerning but is willing to see him for a second opinion

## 2012-09-09 NOTE — Telephone Encounter (Signed)
Message copied by Liliana Cline on Tue Sep 09, 2012  4:27 PM ------      Message from: Ardeth Sportsman      Created: Tue Sep 09, 2012  3:47 PM       Majestic Molony: Tell pt the good news!              Normal ultrasound.  No varicocele/abnormal veins.  Normal blood supply.      Scrotal darkening not a pathologic problem.  Follow expectantly for now & see if improves on own over the next few months

## 2012-09-10 NOTE — Telephone Encounter (Signed)
Left message on machine for patient to call back to make him aware of note from Dr Michaell Cowing.

## 2012-09-10 NOTE — Telephone Encounter (Signed)
Spoke with patient and discussed recommendations. He will call us if this gets worse and make appt with Dr Laverle Patter if he desires a second opinion.

## 2012-11-26 DIAGNOSIS — N509 Disorder of male genital organs, unspecified: Secondary | ICD-10-CM | POA: Diagnosis not present

## 2012-12-16 ENCOUNTER — Other Ambulatory Visit: Payer: Self-pay | Admitting: Gastroenterology

## 2012-12-16 DIAGNOSIS — K5730 Diverticulosis of large intestine without perforation or abscess without bleeding: Secondary | ICD-10-CM | POA: Diagnosis not present

## 2012-12-16 DIAGNOSIS — Z09 Encounter for follow-up examination after completed treatment for conditions other than malignant neoplasm: Secondary | ICD-10-CM | POA: Diagnosis not present

## 2012-12-16 DIAGNOSIS — D126 Benign neoplasm of colon, unspecified: Secondary | ICD-10-CM | POA: Diagnosis not present

## 2012-12-16 DIAGNOSIS — Z86010 Personal history of colonic polyps: Secondary | ICD-10-CM | POA: Diagnosis not present

## 2013-03-03 DIAGNOSIS — C61 Malignant neoplasm of prostate: Secondary | ICD-10-CM | POA: Diagnosis not present

## 2013-03-10 DIAGNOSIS — Z8546 Personal history of malignant neoplasm of prostate: Secondary | ICD-10-CM | POA: Diagnosis not present

## 2013-03-10 DIAGNOSIS — R3129 Other microscopic hematuria: Secondary | ICD-10-CM | POA: Diagnosis not present

## 2013-03-10 DIAGNOSIS — N529 Male erectile dysfunction, unspecified: Secondary | ICD-10-CM | POA: Diagnosis not present

## 2013-03-16 DIAGNOSIS — M67919 Unspecified disorder of synovium and tendon, unspecified shoulder: Secondary | ICD-10-CM | POA: Diagnosis not present

## 2013-03-30 ENCOUNTER — Other Ambulatory Visit: Payer: Medicare Other

## 2013-04-06 ENCOUNTER — Encounter: Payer: Medicare Other | Admitting: Family Medicine

## 2013-04-07 ENCOUNTER — Other Ambulatory Visit: Payer: Self-pay | Admitting: Family Medicine

## 2013-04-07 DIAGNOSIS — Z79899 Other long term (current) drug therapy: Secondary | ICD-10-CM

## 2013-04-07 DIAGNOSIS — E7800 Pure hypercholesterolemia, unspecified: Secondary | ICD-10-CM

## 2013-04-07 DIAGNOSIS — Z13220 Encounter for screening for lipoid disorders: Secondary | ICD-10-CM

## 2013-04-07 DIAGNOSIS — Z125 Encounter for screening for malignant neoplasm of prostate: Secondary | ICD-10-CM

## 2013-04-08 ENCOUNTER — Other Ambulatory Visit (INDEPENDENT_AMBULATORY_CARE_PROVIDER_SITE_OTHER): Payer: Medicare Other

## 2013-04-08 ENCOUNTER — Encounter: Payer: Self-pay | Admitting: Family Medicine

## 2013-04-08 DIAGNOSIS — Z79899 Other long term (current) drug therapy: Secondary | ICD-10-CM | POA: Diagnosis not present

## 2013-04-08 DIAGNOSIS — E7800 Pure hypercholesterolemia, unspecified: Secondary | ICD-10-CM | POA: Diagnosis not present

## 2013-04-08 DIAGNOSIS — Z125 Encounter for screening for malignant neoplasm of prostate: Secondary | ICD-10-CM | POA: Diagnosis not present

## 2013-04-08 DIAGNOSIS — R5383 Other fatigue: Secondary | ICD-10-CM | POA: Diagnosis not present

## 2013-04-08 DIAGNOSIS — R5381 Other malaise: Secondary | ICD-10-CM

## 2013-04-08 DIAGNOSIS — Z8546 Personal history of malignant neoplasm of prostate: Secondary | ICD-10-CM | POA: Diagnosis not present

## 2013-04-08 LAB — LIPID PANEL
Cholesterol: 182 mg/dL (ref 0–200)
LDL Cholesterol: 119 mg/dL — ABNORMAL HIGH (ref 0–99)
Total CHOL/HDL Ratio: 3
VLDL: 7.4 mg/dL (ref 0.0–40.0)

## 2013-04-08 LAB — HEPATIC FUNCTION PANEL
Alkaline Phosphatase: 71 U/L (ref 39–117)
Bilirubin, Direct: 0.1 mg/dL (ref 0.0–0.3)
Total Bilirubin: 0.5 mg/dL (ref 0.3–1.2)

## 2013-04-08 LAB — BASIC METABOLIC PANEL
BUN: 19 mg/dL (ref 6–23)
CO2: 30 mEq/L (ref 19–32)
Calcium: 9.4 mg/dL (ref 8.4–10.5)
Chloride: 106 mEq/L (ref 96–112)
Creatinine, Ser: 1 mg/dL (ref 0.4–1.5)

## 2013-04-08 LAB — CBC WITH DIFFERENTIAL/PLATELET
Basophils Absolute: 0 10*3/uL (ref 0.0–0.1)
Basophils Relative: 0.9 % (ref 0.0–3.0)
Eosinophils Absolute: 0.2 10*3/uL (ref 0.0–0.7)
HCT: 41.1 % (ref 39.0–52.0)
Hemoglobin: 13.9 g/dL (ref 13.0–17.0)
Lymphs Abs: 1.8 10*3/uL (ref 0.7–4.0)
MCHC: 33.7 g/dL (ref 30.0–36.0)
MCV: 93.8 fl (ref 78.0–100.0)
Neutro Abs: 2.8 10*3/uL (ref 1.4–7.7)
RBC: 4.38 Mil/uL (ref 4.22–5.81)
RDW: 13.7 % (ref 11.5–14.6)

## 2013-04-08 LAB — PSA, MEDICARE: PSA: 0.02 ng/ml — ABNORMAL LOW (ref 0.10–4.00)

## 2013-04-13 DIAGNOSIS — Z961 Presence of intraocular lens: Secondary | ICD-10-CM | POA: Diagnosis not present

## 2013-04-15 ENCOUNTER — Ambulatory Visit (INDEPENDENT_AMBULATORY_CARE_PROVIDER_SITE_OTHER): Payer: Medicare Other | Admitting: Family Medicine

## 2013-04-15 ENCOUNTER — Encounter: Payer: Self-pay | Admitting: Family Medicine

## 2013-04-15 VITALS — BP 120/70 | HR 57 | Temp 97.8°F | Ht 71.0 in | Wt 173.5 lb

## 2013-04-15 DIAGNOSIS — Z0000 Encounter for general adult medical examination without abnormal findings: Secondary | ICD-10-CM

## 2013-04-15 DIAGNOSIS — R7989 Other specified abnormal findings of blood chemistry: Secondary | ICD-10-CM | POA: Diagnosis not present

## 2013-04-15 NOTE — Patient Instructions (Addendum)
F/u lab appointment in 6 weeks

## 2013-04-15 NOTE — Progress Notes (Signed)
Nature conservation officer at [facility] [address] [place] [address] Phone: [phone] Fax: [phone]  Date:  04/15/2013   Name:  Derek [name]   DOB:  15-Jan-1946   MRN:  [id number] Gender: male Age: 67 y.o.  Primary Physician:  Hannah Beat, MD  Evaluating MD: Hannah Beat, MD   Chief Complaint: Annual Exam   History of Present Illness:  Derek [name] is a 67 y.o. pleasant patient who presents with the following:  Medicare CPX:  Urologist - sees Dr. Ellwood Sayers Eye Dr. Burgess Estelle L shoulder is hurt - trouble with normal duties.   Preventative Health Maintenance Visit:  Health Maintenance Summary Reviewed and updated, unless pt declines services.  Tobacco History Reviewed. Alcohol: No concerns, no excessive use Exercise Habits: Some activity, rec at least 30 mins 5 times a week STD concerns: no risk or activity to increase risk Drug Use: None Encouraged self-testicular check  Health Maintenance  Topic Date Due  . Tetanus/tdap  02/02/1963  . Pneumococcal Polysaccharide Vaccine Age 24 And Over  02/01/2009  . Influenza Vaccine  05/25/2013  . Colonoscopy  12/17/2022  . Zostavax  Completed    Labs reviewed with the patient.  Results for orders placed in visit on 04/08/13  LIPID PANEL      Result Value Range   Cholesterol 182  0 - 200 mg/dL   Triglycerides 86.5  0.0 - 149.0 mg/dL   HDL 78.46  >96.29 mg/dL   VLDL 7.4  0.0 - 52.8 mg/dL   LDL Cholesterol 413 (*) 0 - 99 mg/dL   Total CHOL/HDL Ratio 3    HEPATIC FUNCTION PANEL      Result Value Range   Total Bilirubin 0.5  0.3 - 1.2 mg/dL   Bilirubin, Direct 0.1  0.0 - 0.3 mg/dL   Alkaline Phosphatase 71  39 - 117 U/L   AST 109 (*) 0 - 37 U/L   ALT 45  0 - 53 U/L   Total Protein 6.7  6.0 - 8.3 g/dL   Albumin 4.1  3.5 - 5.2 g/dL  CBC WITH DIFFERENTIAL      Result Value Range   WBC 5.2  4.5 - 10.5 K/uL   RBC 4.38  4.22 - 5.81 Mil/uL   Hemoglobin 13.9  13.0 - 17.0 g/dL   HCT 24.4  01.0 - 27.2  %   MCV 93.8  78.0 - 100.0 fl   MCHC 33.7  30.0 - 36.0 g/dL   RDW 53.6  64.4 - 03.4 %   Platelets 199.0  150.0 - 400.0 K/uL   Neutrophils Relative % 53.7  43.0 - 77.0 %   Lymphocytes Relative 33.7  12.0 - 46.0 %   Monocytes Relative 7.0  3.0 - 12.0 %   Eosinophils Relative 4.7  0.0 - 5.0 %   Basophils Relative 0.9  0.0 - 3.0 %   Neutro Abs 2.8  1.4 - 7.7 K/uL   Lymphs Abs 1.8  0.7 - 4.0 K/uL   Monocytes Absolute 0.4  0.1 - 1.0 K/uL   Eosinophils Absolute 0.2  0.0 - 0.7 K/uL   Basophils Absolute 0.0  0.0 - 0.1 K/uL  BASIC METABOLIC PANEL      Result Value Range   Sodium 140  135 - 145 mEq/L   Potassium 4.4  3.5 - 5.1 mEq/L   Chloride 106  96 - 112 mEq/L   CO2 30  19 - 32 mEq/L   Glucose, Bld 93  70 -  99 mg/dL   BUN 19  6 - 23 mg/dL   Creatinine, Ser 1.0  0.4 - 1.5 mg/dL   Calcium 9.4  8.4 - 96.0 mg/dL   GFR 45.40  >98.11 mL/min  PSA, MEDICARE      Result Value Range   PSA 0.02 (*) 0.10 - 4.00 ng/ml     Patient Active Problem List   Diagnosis Date Noted  . Scrotal discoloration / purpling ?varicocele 09/08/2012  . Bilateral inguinal hernia (BIH) s/p lap repair 07/06/2012 06/24/2012  . OTHER MALAISE AND FATIGUE 04/27/2009  . PROSTATE CANCER, HX OF 11/10/2008  . CARCINOMA, BASAL CELL, HX OF 11/10/2008  . RHEUMATIC FEVER, HX OF 11/10/2008  . DIVERTICULITIS, HX OF 11/10/2008    Past Medical History  Diagnosis Date  . Cancer of prostate     pT2c No Mx, Gleason 3+4=7  . Diverticulitis   . Kidney stones   . Rheumatic fever 1952-1953  . Basal cell carcinoma 2009    back  . Hypogonadism male   . Nephrolithiasis     Past Surgical History  Procedure Laterality Date  . Rotator cuff surgery  2002    Gioffre  . Knee surgery  2005    Duda- arthroscopy, partial medial menisectomy  . Prostate surgery  12/19/06    robotic prostatectomy  . Bilateral inguinal hernia repair  07/10/2012    lap BIH repairs  . Hernia repair  07/10/12    [facility]    History   Social History  .  Marital Status: Married    Spouse Name: N/A    Number of Children: N/A  . Years of Education: N/A   Occupational History  . retired Systems analyst) Herbalist   Social History Main Topics  . Smoking status: Former Smoker    Quit date: 09/25/1971  . Smokeless tobacco: Never Used  . Alcohol Use: No  . Drug Use: No  . Sexually Active: Not on file   Other Topics Concern  . Not on file   Social History Narrative   Regular exercise: yes   Runner, former marathon runner    Family History  Problem Relation Age of Onset  . Breast cancer Mother   . Cancer Brother 51    brain tumor  . Heart disease      fam hx  . Arthritis Other     other relative  . Prostate cancer Other     nephew  . Lung cancer Other 50    nephew    No Known Allergies  Medication list has been reviewed and updated.  Outpatient Prescriptions Prior to Visit  Medication Sig Dispense Refill  . aspirin 81 MG tablet Take 81 mg by mouth once.        . multivitamin-iron-minerals-folic acid (CENTRUM) chewable tablet Chew 1 tablet by mouth daily.       No facility-administered medications prior to visit.    Review of Systems:   General: Denies fever, chills, sweats. No significant weight loss. Eyes: Denies blurring,significant itching ENT: Denies earache, sore throat, and hoarseness. Cardiovascular: Denies chest pains, palpitations, dyspnea on exertion Respiratory: Denies cough, dyspnea at rest,wheeezing Breast: no concerns about lumps GI: Denies nausea, vomiting, diarrhea, constipation, change in bowel habits, abdominal pain, melena, hematochezia GU: Denies penile discharge, ED, urinary flow / outflow problems. No STD concerns. Musculoskeletal: Denies back pain, joint pain - little shoulder ache Derm: Denies rash, itching Neuro: Denies  paresthesias, frequent falls, frequent headaches Psych: Denies depression, anxiety Endocrine: Denies  cold intolerance, heat intolerance, polydipsia Heme:  Denies enlarged lymph nodes Allergy: No hayfever  Physical Examination: BP 120/70  Pulse 57  Temp(Src) 97.8 F (36.6 C) (Oral)  Ht 5\' 11"  (1.803 m)  Wt 173 lb 8 oz (78.699 kg)  BMI 24.21 kg/m2  SpO2 98%  Ideal Body Weight: Weight in (lb) to have BMI = 25: 178.9  GEN: well developed, well nourished, no acute distress Eyes: conjunctiva and lids normal, PERRLA, EOMI ENT: TM clear, nares clear, oral exam WNL Neck: supple, no lymphadenopathy, no thyromegaly, no JVD Pulm: clear to auscultation and percussion, respiratory effort normal CV: regular rate and rhythm, S1-S2, no murmur, rub or gallop, no bruits, peripheral pulses normal and symmetric, no cyanosis, clubbing, edema or varicosities GI: soft, non-tender; no hepatosplenomegaly, masses; active bowel sounds all quadrants GU: no hernia, testicular mass, penile discharge Lymph: no cervical, axillary or inguinal adenopathy MSK: gait normal, muscle tone and strength WNL, no joint swelling, effusions, discoloration, crepitus  SKIN: clear, good turgor, color WNL, no rashes, lesions, or ulcerations Neuro: normal mental status, normal strength, sensation, and motion Psych: alert; oriented to person, place and time, normally interactive and not anxious or depressed in appearance.  Assessment and Plan:  Routine general medical examination at a health care facility  Elevated LFTs - Plan: Hepatic function panel  I have personally reviewed the Medicare Annual Wellness questionnaire and have noted 1. The patient's medical and social history 2. Their use of alcohol, tobacco or illicit drugs 3. Their current medications and supplements 4. The patient's functional ability including ADL's, fall risks, home safety risks and hearing or visual             impairment. 5. Diet and physical activities 6. Evidence for depression or mood disorders  The patients weight, height, BMI and visual acuity have been recorded in the chart I have made  referrals, counseling and provided education to the patient based review of the above and I have provided the pt with a written personalized care plan for preventive services.  I have provided the patient with a copy of your personalized plan for preventive services. Instructed to take the time to review along with their updated medication list.   Lab Results  Component Value Date   ALT 45 04/08/2013   AST 109* 04/08/2013   ALKPHOS 71 04/08/2013   BILITOT 0.5 04/08/2013   AST 109 for no real reason. Recheck in 6 weeks.  O/w doing great.  Orders Today:  Orders Placed This Encounter  Procedures  . Hepatic function panel    Standing Status: Future     Number of Occurrences:      Standing Expiration Date: 04/15/2014    Updated Medication List: (Includes new medications, updates to list, dose adjustments) No orders of the defined types were placed in this encounter.    Medications Discontinued: Medications Discontinued During This Encounter  Medication Reason  . aspirin 81 MG tablet Error  . multivitamin-iron-minerals-folic acid (CENTRUM) chewable tablet Error      Signed, Alizey Noren T. Arthur Aydelotte, MD 04/15/2013 2:55 PM

## 2013-04-29 ENCOUNTER — Other Ambulatory Visit: Payer: Self-pay

## 2013-06-01 ENCOUNTER — Other Ambulatory Visit: Payer: Self-pay | Admitting: Family Medicine

## 2013-06-01 ENCOUNTER — Other Ambulatory Visit (INDEPENDENT_AMBULATORY_CARE_PROVIDER_SITE_OTHER): Payer: Medicare Other

## 2013-06-01 ENCOUNTER — Telehealth: Payer: Self-pay

## 2013-06-01 DIAGNOSIS — R7989 Other specified abnormal findings of blood chemistry: Secondary | ICD-10-CM

## 2013-06-01 LAB — HEPATIC FUNCTION PANEL
ALT: 17 U/L (ref 0–53)
AST: 24 U/L (ref 0–37)
Albumin: 4 g/dL (ref 3.5–5.2)
Total Bilirubin: 0.8 mg/dL (ref 0.3–1.2)

## 2013-06-01 NOTE — Telephone Encounter (Signed)
Since you got 1st shot in 2007, then you should get a 2nd pneumovax shot. Non-emergently.  I am sorry that I did not see it at your physical. I would repeat at any point that it is convenient for you.

## 2013-06-01 NOTE — Telephone Encounter (Signed)
Pt left v/m requesting cb; pt wants to know if Dr Patsy Lager is recommending pts to have a second pneumonia shot when pt is over age 69. Pt had pneumonia vaccine 08/14/2006.Please advise.

## 2013-06-02 DIAGNOSIS — Z85828 Personal history of other malignant neoplasm of skin: Secondary | ICD-10-CM | POA: Diagnosis not present

## 2013-06-02 DIAGNOSIS — D2330 Other benign neoplasm of skin of unspecified part of face: Secondary | ICD-10-CM | POA: Diagnosis not present

## 2013-06-02 DIAGNOSIS — D1801 Hemangioma of skin and subcutaneous tissue: Secondary | ICD-10-CM | POA: Diagnosis not present

## 2013-06-02 DIAGNOSIS — D485 Neoplasm of uncertain behavior of skin: Secondary | ICD-10-CM | POA: Diagnosis not present

## 2013-06-02 DIAGNOSIS — L570 Actinic keratosis: Secondary | ICD-10-CM | POA: Diagnosis not present

## 2013-06-02 NOTE — Telephone Encounter (Signed)
Pt. notified as instructed by telephone.  He will call and schedule nurse visit for pneumovax.  He is scheduled for rotator cuff surgery 07/10/2013.

## 2013-06-08 NOTE — Telephone Encounter (Signed)
Pt request cb to schedule nurse visit for pneumovax.

## 2013-06-08 NOTE — Telephone Encounter (Signed)
Patient called back today to inquire which vaccine he needs:  Pneumovax 23 or Prevnar 13.  States he read an article in [employer] that states you should get a prevnar 13 vaccine after you have had the pneumovax.  I advised patient I had not heard of this recommendation but I would send the message to Dr. Patsy Lager to advise which vaccine to give.  Derek [name] states he will call me back next week to schedule an appointment to get which ever vaccine Dr. Patsy Lager recommends.  Please advise.  Ileana Ladd

## 2013-06-09 NOTE — Telephone Encounter (Signed)
i called and left detailed message. Can he come in for nurse visit for pneumovax? thanks  For now, he needs his second pneumovax-23.  His question about prevnar is very good, and there was a very recent recommendation about this vaccine, but it is so new that we do not have a process in place to give it, have not stocked it, and do not know yet if it will be paid for.   Hannah Beat, MD 06/09/2013, 10:34 AM

## 2013-06-11 ENCOUNTER — Ambulatory Visit: Payer: Medicare Other

## 2013-06-25 ENCOUNTER — Ambulatory Visit (INDEPENDENT_AMBULATORY_CARE_PROVIDER_SITE_OTHER): Payer: Medicare Other | Admitting: *Deleted

## 2013-06-25 ENCOUNTER — Emergency Department (HOSPITAL_COMMUNITY): Payer: Medicare Other

## 2013-06-25 ENCOUNTER — Inpatient Hospital Stay (HOSPITAL_COMMUNITY)
Admission: EM | Admit: 2013-06-25 | Discharge: 2013-06-27 | DRG: 390 | Disposition: A | Discharge status: Home / Self Care | Payer: Medicare Other | Attending: Surgery | Admitting: Surgery

## 2013-06-25 ENCOUNTER — Encounter (HOSPITAL_COMMUNITY): Payer: Self-pay | Admitting: Adult Health

## 2013-06-25 DIAGNOSIS — Z23 Encounter for immunization: Secondary | ICD-10-CM | POA: Diagnosis not present

## 2013-06-25 DIAGNOSIS — Z87891 Personal history of nicotine dependence: Secondary | ICD-10-CM

## 2013-06-25 DIAGNOSIS — K565 Intestinal adhesions [bands], unspecified as to partial versus complete obstruction: Principal | ICD-10-CM | POA: Diagnosis present

## 2013-06-25 DIAGNOSIS — K56609 Unspecified intestinal obstruction, unspecified as to partial versus complete obstruction: Secondary | ICD-10-CM

## 2013-06-25 DIAGNOSIS — Z4682 Encounter for fitting and adjustment of non-vascular catheter: Secondary | ICD-10-CM | POA: Diagnosis not present

## 2013-06-25 DIAGNOSIS — Z85828 Personal history of other malignant neoplasm of skin: Secondary | ICD-10-CM

## 2013-06-25 DIAGNOSIS — R141 Gas pain: Secondary | ICD-10-CM | POA: Diagnosis not present

## 2013-06-25 LAB — COMPREHENSIVE METABOLIC PANEL
ALT: 21 U/L (ref 0–53)
AST: 30 U/L (ref 0–37)
CO2: 28 mEq/L (ref 19–32)
Calcium: 10.3 mg/dL (ref 8.4–10.5)
Chloride: 98 mEq/L (ref 96–112)
Creatinine, Ser: 1.06 mg/dL (ref 0.50–1.35)
GFR calc non Af Amer: 70 mL/min — ABNORMAL LOW (ref >90)
Sodium: 139 mEq/L (ref 135–145)
Total Bilirubin: 0.8 mg/dL (ref 0.3–1.2)
Total Protein: 8 g/dL (ref 6.0–8.3)

## 2013-06-25 LAB — CBC WITH DIFFERENTIAL/PLATELET
Basophils Absolute: 0 10*3/uL (ref 0.0–0.1)
Eosinophils Relative: 0 % (ref 0–5)
Lymphocytes Relative: 9 % — ABNORMAL LOW (ref 12–46)
MCH: 32.3 pg (ref 26.0–34.0)
Neutro Abs: 9.5 10*3/uL — ABNORMAL HIGH (ref 1.7–7.7)
Platelets: 230 10*3/uL (ref 150–400)
RDW: 13 % (ref 11.5–15.5)
WBC: 10.8 10*3/uL — ABNORMAL HIGH (ref 4.0–10.5)

## 2013-06-25 LAB — URINALYSIS, ROUTINE W REFLEX MICROSCOPIC
Leukocytes, UA: NEGATIVE
Nitrite: NEGATIVE
Specific Gravity, Urine: 1.034 — ABNORMAL HIGH (ref 1.005–1.030)
pH: 6.5 (ref 5.0–8.0)

## 2013-06-25 LAB — POCT I-STAT TROPONIN I

## 2013-06-25 LAB — URINE MICROSCOPIC-ADD ON

## 2013-06-25 LAB — CG4 I-STAT (LACTIC ACID): Lactic Acid, Venous: 1.28 mmol/L (ref 0.5–2.2)

## 2013-06-25 MED ORDER — ACETAMINOPHEN 325 MG PO TABS: 650.0000 mg | ORAL_TABLET | Freq: Four times a day (QID) | ORAL | Status: DC | PRN

## 2013-06-25 MED ORDER — ACETAMINOPHEN 650 MG RE SUPP: 650.0000 mg | Freq: Four times a day (QID) | RECTAL | Status: DC | PRN

## 2013-06-25 MED ORDER — HEPARIN SODIUM (PORCINE) 5000 UNIT/ML IJ SOLN
5000.0000 [IU] | Freq: Three times a day (TID) | INTRAMUSCULAR | Status: DC
  Filled 2013-06-25 (×7): qty 1

## 2013-06-25 MED ORDER — MORPHINE SULFATE 4 MG/ML IJ SOLN
4.0000 mg | INTRAMUSCULAR | Status: DC | PRN
  Administered 2013-06-25: 4 mg via INTRAVENOUS
  Filled 2013-06-25: qty 1

## 2013-06-25 MED ORDER — KCL IN DEXTROSE-NACL 20-5-0.45 MEQ/L-%-% IV SOLN
INTRAVENOUS | Status: DC
  Administered 2013-06-25: 1 mL via INTRAVENOUS
  Administered 2013-06-26 – 2013-06-27 (×2): via INTRAVENOUS
  Filled 2013-06-25 (×7): qty 1000

## 2013-06-25 MED ORDER — SODIUM CHLORIDE 0.9 % IV BOLUS (SEPSIS)
1000.0000 mL | Freq: Once | INTRAVENOUS | Status: AC
  Administered 2013-06-25: 1000 mL via INTRAVENOUS

## 2013-06-25 MED ORDER — PANTOPRAZOLE SODIUM 40 MG IV SOLR
40.0000 mg | Freq: Every day | INTRAVENOUS | Status: DC
  Administered 2013-06-25 – 2013-06-26 (×2): 40 mg via INTRAVENOUS
  Filled 2013-06-25 (×4): qty 40

## 2013-06-25 MED ORDER — ONDANSETRON HCL 4 MG/2ML IJ SOLN
4.0000 mg | Freq: Once | INTRAMUSCULAR | Status: AC
  Administered 2013-06-25: 4 mg via INTRAVENOUS
  Filled 2013-06-25: qty 2

## 2013-06-25 MED ORDER — IOHEXOL 300 MG/ML  SOLN: 25.0000 mL | INTRAMUSCULAR | Status: DC

## 2013-06-25 MED ORDER — IOHEXOL 300 MG/ML  SOLN
100.0000 mL | Freq: Once | INTRAMUSCULAR | Status: AC | PRN
  Administered 2013-06-25: 100 mL via INTRAVENOUS

## 2013-06-25 MED ORDER — ONDANSETRON HCL 4 MG/2ML IJ SOLN: 4.0000 mg | Freq: Four times a day (QID) | INTRAMUSCULAR | Status: DC | PRN

## 2013-06-25 NOTE — ED Notes (Signed)
Pt sts has finished first cup of contrast. Attempted to call CT x2

## 2013-06-25 NOTE — H&P (Signed)
Derek [name] is an 67 y.o. male.   Chief Complaint: abdominal pain and distention HPI: Patient developed abdominal pain and distention yesterday evening. He had difficulty sleeping. It persisted. He had associated nausea but no vomiting. Pain is relieved when he sits up. He came to the emergency department for evaluation. Workup included CT scan of the abdomen and pelvis which reveal small bowel obstruction, likely due to adhesions. There were no complicating features seen.I was asked to admit him to the surgery service for treatment of small bowel obstruction.Of note, he is status post robotic prostatectomy and bilateral inguinal hernia repairs.  Past Medical History  Diagnosis Date  . Cancer of prostate     pT2c No Mx, Gleason 3+4=7  . Diverticulitis   . Kidney stones   . Rheumatic fever 1952-1953  . Basal cell carcinoma 2009    back  . Hypogonadism male   . Nephrolithiasis     Past Surgical History  Procedure Laterality Date  . Rotator cuff surgery  2002    Gioffre  . Knee surgery  2005    Duda- arthroscopy, partial medial menisectomy  . Prostate surgery  12/19/06    robotic prostatectomy  . Bilateral inguinal hernia repair  07/10/2012    lap BIH repairs  . Hernia repair  07/10/12    [facility]    Family History  Problem Relation Age of Onset  . Breast cancer Mother   . Cancer Brother 51    brain tumor  . Heart disease      fam hx  . Arthritis Other     other relative  . Prostate cancer Other     nephew  . Lung cancer Other 75    nephew   Social History:  reports that he quit smoking about 41 years ago. He has never used smokeless tobacco. He reports that he does not drink alcohol or use illicit drugs.  Allergies: No Known Allergies  No prescriptions prior to admission    Results for orders placed during the hospital encounter of 06/25/13 (from the past 48 hour(s))  CBC WITH DIFFERENTIAL     Status: Abnormal   Collection Time    06/25/13  5:10 PM      Result Value  Range   WBC 10.8 (*) 4.0 - 10.5 K/uL   RBC 5.04  4.22 - 5.81 MIL/uL   Hemoglobin 16.3  13.0 - 17.0 g/dL   HCT 09.8  11.9 - 14.7 %   MCV 89.3  78.0 - 100.0 fL   MCH 32.3  26.0 - 34.0 pg   MCHC 36.2 (*) 30.0 - 36.0 g/dL   RDW 82.9  56.2 - 13.0 %   Platelets 230  150 - 400 K/uL   Neutrophils Relative % 88 (*) 43 - 77 %   Neutro Abs 9.5 (*) 1.7 - 7.7 K/uL   Lymphocytes Relative 9 (*) 12 - 46 %   Lymphs Abs 0.9  0.7 - 4.0 K/uL   Monocytes Relative 3  3 - 12 %   Monocytes Absolute 0.3  0.1 - 1.0 K/uL   Eosinophils Relative 0  0 - 5 %   Eosinophils Absolute 0.0  0.0 - 0.7 K/uL   Basophils Relative 0  0 - 1 %   Basophils Absolute 0.0  0.0 - 0.1 K/uL  COMPREHENSIVE METABOLIC PANEL     Status: Abnormal   Collection Time    06/25/13  5:10 PM      Result Value Range  Sodium 139  135 - 145 mEq/L   Potassium 3.8  3.5 - 5.1 mEq/L   Chloride 98  96 - 112 mEq/L   CO2 28  19 - 32 mEq/L   Glucose, Bld 115 (*) 70 - 99 mg/dL   BUN 14  6 - 23 mg/dL   Creatinine, Ser 1.61  0.50 - 1.35 mg/dL   Calcium 09.6  8.4 - 04.5 mg/dL   Total Protein 8.0  6.0 - 8.3 g/dL   Albumin 4.7  3.5 - 5.2 g/dL   AST 30  0 - 37 U/L   ALT 21  0 - 53 U/L   Alkaline Phosphatase 92  39 - 117 U/L   Total Bilirubin 0.8  0.3 - 1.2 mg/dL   GFR calc non Af Amer 70 (*) >90 mL/min   GFR calc Af Amer 81 (*) >90 mL/min   Comment: (NOTE)     The eGFR has been calculated using the CKD EPI equation.     This calculation has not been validated in all clinical situations.     eGFR's persistently <90 mL/min signify possible Chronic Kidney     Disease.  LIPASE, BLOOD     Status: None   Collection Time    06/25/13  5:10 PM      Result Value Range   Lipase 37  11 - 59 U/L  POCT I-STAT TROPONIN I     Status: None   Collection Time    06/25/13  5:31 PM      Result Value Range   Troponin i, poc 0.00  0.00 - 0.08 ng/mL   Comment 3            Comment: Due to the release kinetics of cTnI,     a negative result within the first hours      of the onset of symptoms does not rule out     myocardial infarction with certainty.     If myocardial infarction is still suspected,     repeat the test at appropriate intervals.  CG4 I-STAT (LACTIC ACID)     Status: None   Collection Time    06/25/13  6:41 PM      Result Value Range   Lactic Acid, Venous 1.28  0.5 - 2.2 mmol/L  URINALYSIS, ROUTINE W REFLEX MICROSCOPIC     Status: Abnormal   Collection Time    06/25/13  7:00 PM      Result Value Range   Color, Urine AMBER (*) YELLOW   Comment: BIOCHEMICALS MAY BE AFFECTED BY COLOR   APPearance CLEAR  CLEAR   Specific Gravity, Urine 1.034 (*) 1.005 - 1.030   pH 6.5  5.0 - 8.0   Glucose, UA NEGATIVE  NEGATIVE mg/dL   Hgb urine dipstick TRACE (*) NEGATIVE   Bilirubin Urine SMALL (*) NEGATIVE   Ketones, ur >80 (*) NEGATIVE mg/dL   Protein, ur 30 (*) NEGATIVE mg/dL   Urobilinogen, UA 1.0  0.0 - 1.0 mg/dL   Nitrite NEGATIVE  NEGATIVE   Leukocytes, UA NEGATIVE  NEGATIVE  URINE MICROSCOPIC-ADD ON     Status: None   Collection Time    06/25/13  7:00 PM      Result Value Range   Squamous Epithelial / LPF RARE  RARE   RBC / HPF 7-10  <3 RBC/hpf   Urine-Other MUCOUS PRESENT     Ct Abdomen Pelvis W Contrast  06/25/2013   CLINICAL DATA:  Abdominal distention and pain.  EXAM: CT ABDOMEN AND PELVIS WITH CONTRAST  TECHNIQUE: Multidetector CT imaging of the abdomen and pelvis was performed using the standard protocol following bolus administration of intravenous contrast.  CONTRAST:  OMNIPAQUE IOHEXOL 300 MG/ML  SOLN  COMPARISON:  None.  FINDINGS: The lung bases are grossly clear. Moderate breathing motion artifact. Streaky subsegmental bibasilar atelectasis is noted.  The liver is unremarkable. No focal lesions or biliary dilatation. The gallbladder is normal. No common bowel duct dilatation. The pancreas is normal. The spleen is normal. The adrenal glands and kidneys are normal.  The stomach is distended with contrast and air. The  proximal small bowel is also distended with scattered air-fluid levels. Findings consistent with a small bowel obstruction. There is a transition to normal/decompressed mid distal ileal loops of small bowel. Suspect adhesions in the left lower abdomen at the level of the iliac crest. No obstructing mass or inflammatory changes. The colon is unremarkable. Moderate sigmoid diverticulosis without findings for acute diverticulitis.  The aorta is normal in caliber. The major branch vessels are normal. No mesenteric or retroperitoneal mass or adenopathy.  The bladder, prostate gland and seminal vesicles are unremarkable. There is a small amount of free pelvic fluid likely from a small bowel obstruction.  The bony structures are intact. Bilateral pars defects noted at L5 with a grade 2 spondylolisthesis.  IMPRESSION: 1. CT findings consistent with a the mid small bowel obstruction likely due to adhesions in the left mid abdomen. No obstructing mass. 2. Diverticulosis of the sigmoid colon but no findings for diverticulitis. 3. The solid abdominal organs are unremarkable.   Electronically Signed   By: Loralie Champagne M.D.   On: 06/25/2013 20:10    Review of Systems  Constitutional: Negative.   HENT: Negative.   Eyes: Negative.   Respiratory: Negative.   Cardiovascular: Negative.   Gastrointestinal: Positive for nausea and abdominal pain. Negative for vomiting and diarrhea.  Genitourinary: Negative.   Musculoskeletal: Negative.   Skin: Negative.   Neurological: Negative.   Endo/Heme/Allergies: Negative.   Psychiatric/Behavioral: Negative.     Blood pressure 130/74, pulse 51, temperature 97.8 F (36.6 C), temperature source Oral, resp. rate 16, height 5\' 11"  (1.803 m), weight 78.4 kg (172 lb 13.5 oz), SpO2 99.00%. Physical Exam  Constitutional: He is oriented to person, place, and time. He appears well-developed and well-nourished. No distress.  HENT:  Head: Normocephalic and atraumatic.  Mouth/Throat:  No oropharyngeal exudate.  Eyes: EOM are normal. No scleral icterus.  Neck: Normal range of motion. Neck supple. No tracheal deviation present.  Cardiovascular: Normal rate, normal heart sounds and intact distal pulses.   Respiratory: Effort normal and breath sounds normal. No stridor. No respiratory distress. He has no wheezes. He has no rales.  GI: Soft. He exhibits distension. He exhibits no mass. There is no tenderness. There is no rebound and no guarding.  Distended but no significant tenderness, hypoactive bowel sounds, no masses  Musculoskeletal: Normal range of motion.  Neurological: He is alert and oriented to person, place, and time. He exhibits normal muscle tone. Coordination normal.  Skin: Skin is warm and dry.  Psychiatric: He has a normal mood and affect.     Assessment/Plan Small bowel obstruction likely due to adhesions: Will admit, place on IV fluids, place NG tube to suction. If patient does not improve over the next 24-48 hours, surgery may be necessary to relieve the obstruction. We'll check labs in the morning. Plan was discussed in detail the patient and his  family.  Venetta Knee E 06/25/2013, 9:15 PM

## 2013-06-25 NOTE — ED Provider Notes (Signed)
CSN: [id number]     Arrival date & time 06/25/13  1701 History   First MD Initiated Contact with Patient 06/25/13 1802     Chief Complaint  Patient presents with  . Abdominal Pain   (Consider location/radiation/quality/duration/timing/severity/associated sxs/prior Treatment) HPI  Mr. Derek [name] is a previously healthy 67 year old male with abdominal discomfort that started last night about 8 PM critical distended with his abdomen. He is a meal with his family no one else is having any symptoms. He was uncomfortable and could not sleep last night. Progressive feeling of distention. Nausea. No vomiting although he states "I tried to home". Last bowel movement was early this morning and no passage of gas/flatus or stool since this morning. No urinary symptoms. He had a robotic laparoscopic assisted prostatectomy. He has had bilateral inguinal hernia repairs. No previous episodes of small bowel obstruction.  Past Medical History  Diagnosis Date  . Cancer of prostate     pT2c No Mx, Gleason 3+4=7  . Diverticulitis   . Kidney stones   . Rheumatic fever 1952-1953  . Basal cell carcinoma 2009    back  . Hypogonadism male   . Nephrolithiasis    Past Surgical History  Procedure Laterality Date  . Rotator cuff surgery  2002    Gioffre  . Knee surgery  2005    Duda- arthroscopy, partial medial menisectomy  . Prostate surgery  12/19/06    robotic prostatectomy  . Bilateral inguinal hernia repair  07/10/2012    lap BIH repairs  . Hernia repair  07/10/12    [facility]   Family History  Problem Relation Age of Onset  . Breast cancer Mother   . Cancer Brother 51    brain tumor  . Heart disease      fam hx  . Arthritis Other     other relative  . Prostate cancer Other     nephew  . Lung cancer Other 20    nephew   History  Substance Use Topics  . Smoking status: Former Smoker    Quit date: 09/25/1971  . Smokeless tobacco: Never Used  . Alcohol Use: No    Review of Systems  Constitutional:  Negative for fever, chills, diaphoresis, appetite change and fatigue.  HENT: Negative for sore throat, mouth sores and trouble swallowing.   Eyes: Negative for visual disturbance.  Respiratory: Negative for cough, chest tightness, shortness of breath and wheezing.   Cardiovascular: Negative for chest pain.  Gastrointestinal: Positive for nausea, abdominal pain and abdominal distention. Negative for vomiting and diarrhea.       No passage of gas since this morning  Endocrine: Negative for polydipsia, polyphagia and polyuria.  Genitourinary: Negative for dysuria, frequency and hematuria.  Musculoskeletal: Negative for gait problem.  Skin: Negative for color change, pallor and rash.  Neurological: Negative for dizziness, syncope, light-headedness and headaches.  Hematological: Does not bruise/bleed easily.  Psychiatric/Behavioral: Negative for behavioral problems and confusion.    Allergies  Review of patient's allergies indicates no known allergies.  Home Medications  No current outpatient prescriptions on file. BP 131/76  Pulse 51  Temp(Src) 97.8 F (36.6 C) (Oral)  Resp 16  Ht 5\' 11"  (1.803 m)  Wt 167 lb (75.751 kg)  BMI 23.3 kg/m2  SpO2 97% Physical Exam  Constitutional: He is oriented to person, place, and time. He appears well-developed and well-nourished. No distress.  HENT:  Head: Normocephalic.  Eyes: Conjunctivae are normal. Pupils are equal, round, and reactive to light. No scleral  icterus.  Neck: Normal range of motion. Neck supple. No thyromegaly present.  Cardiovascular: Normal rate and regular rhythm.  Exam reveals no gallop and no friction rub.   No murmur heard. Pulmonary/Chest: Effort normal and breath sounds normal. No respiratory distress. He has no wheezes. He has no rales.  Abdominal: Soft. He exhibits distension. Bowel sounds are decreased. There is generalized tenderness. There is no rebound.  Abdomen soft but distended. Markedly hypoactive bowel sounds  mild diffuse tenderness no frank guarding or peritoneal irritation.  Musculoskeletal: Normal range of motion.  Neurological: He is alert and oriented to person, place, and time.  Skin: Skin is warm and dry. No rash noted.  Psychiatric: He has a normal mood and affect. His behavior is normal.    ED Course  Procedures (including critical care time) Labs Review Labs Reviewed  CBC WITH DIFFERENTIAL - Abnormal; Notable for the following:    WBC 10.8 (*)    MCHC 36.2 (*)    Neutrophils Relative % 88 (*)    Neutro Abs 9.5 (*)    Lymphocytes Relative 9 (*)    All other components within normal limits  COMPREHENSIVE METABOLIC PANEL - Abnormal; Notable for the following:    Glucose, Bld 115 (*)    GFR calc non Af Amer 70 (*)    GFR calc Af Amer 81 (*)    All other components within normal limits  URINALYSIS, ROUTINE W REFLEX MICROSCOPIC - Abnormal; Notable for the following:    Color, Urine AMBER (*)    Specific Gravity, Urine 1.034 (*)    Hgb urine dipstick TRACE (*)    Bilirubin Urine SMALL (*)    Ketones, ur >80 (*)    Protein, ur 30 (*)    All other components within normal limits  LIPASE, BLOOD  URINE MICROSCOPIC-ADD ON  POCT I-STAT TROPONIN I  CG4 I-STAT (LACTIC ACID)   Imaging Review Ct Abdomen Pelvis W Contrast  06/25/2013   CLINICAL DATA:  Abdominal distention and pain.  EXAM: CT ABDOMEN AND PELVIS WITH CONTRAST  TECHNIQUE: Multidetector CT imaging of the abdomen and pelvis was performed using the standard protocol following bolus administration of intravenous contrast.  CONTRAST:  OMNIPAQUE IOHEXOL 300 MG/ML  SOLN  COMPARISON:  None.  FINDINGS: The lung bases are grossly clear. Moderate breathing motion artifact. Streaky subsegmental bibasilar atelectasis is noted.  The liver is unremarkable. No focal lesions or biliary dilatation. The gallbladder is normal. No common bowel duct dilatation. The pancreas is normal. The spleen is normal. The adrenal glands and kidneys are  normal.  The stomach is distended with contrast and air. The proximal small bowel is also distended with scattered air-fluid levels. Findings consistent with a small bowel obstruction. There is a transition to normal/decompressed mid distal ileal loops of small bowel. Suspect adhesions in the left lower abdomen at the level of the iliac crest. No obstructing mass or inflammatory changes. The colon is unremarkable. Moderate sigmoid diverticulosis without findings for acute diverticulitis.  The aorta is normal in caliber. The major branch vessels are normal. No mesenteric or retroperitoneal mass or adenopathy.  The bladder, prostate gland and seminal vesicles are unremarkable. There is a small amount of free pelvic fluid likely from a small bowel obstruction.  The bony structures are intact. Bilateral pars defects noted at L5 with a grade 2 spondylolisthesis.  IMPRESSION: 1. CT findings consistent with a the mid small bowel obstruction likely due to adhesions in the left mid abdomen. No obstructing  mass. 2. Diverticulosis of the sigmoid colon but no findings for diverticulitis. 3. The solid abdominal organs are unremarkable.   Electronically Signed   By: Loralie Champagne M.D.   On: 06/25/2013 20:10    MDM   1. Small bowel obstruction    History exam suggest small bowel instruction. His CT scan shows small bowel stricture with dilation of the stomach and proximal small bowel transition point in the mid abdomen. No other acute abnormalities noted.  Reexam his exam is unchanged he remained stable. Plan is nasogastric tube is been ordered. Has not been placed as yet. Please call general surgeon Dr. Janee Morn on call regarding admission.    Roney Marion, MD 06/25/13 2031

## 2013-06-25 NOTE — ED Notes (Signed)
Presents with abdominal pain/distention and indigestion that began last night at 8 pm. Abdomen is distended, associated with nausea, sob and tightness around abdomen. Normal BM today, denies vomiting, Hx of abdominal surgery.

## 2013-06-26 ENCOUNTER — Ambulatory Visit: Payer: Medicare Other

## 2013-06-26 ENCOUNTER — Inpatient Hospital Stay (HOSPITAL_COMMUNITY): Payer: Medicare Other

## 2013-06-26 DIAGNOSIS — K56609 Unspecified intestinal obstruction, unspecified as to partial versus complete obstruction: Secondary | ICD-10-CM | POA: Diagnosis not present

## 2013-06-26 DIAGNOSIS — Z4682 Encounter for fitting and adjustment of non-vascular catheter: Secondary | ICD-10-CM | POA: Diagnosis not present

## 2013-06-26 DIAGNOSIS — R141 Gas pain: Secondary | ICD-10-CM | POA: Diagnosis not present

## 2013-06-26 LAB — BASIC METABOLIC PANEL
BUN: 12 mg/dL (ref 6–23)
Calcium: 8.8 mg/dL (ref 8.4–10.5)
Creatinine, Ser: 1.08 mg/dL (ref 0.50–1.35)
GFR calc Af Amer: 79 mL/min — ABNORMAL LOW (ref >90)
GFR calc non Af Amer: 68 mL/min — ABNORMAL LOW (ref >90)
Glucose, Bld: 119 mg/dL — ABNORMAL HIGH (ref 70–99)
Potassium: 3.5 mEq/L (ref 3.5–5.1)

## 2013-06-26 LAB — CBC
Hemoglobin: 13.5 g/dL (ref 13.0–17.0)
MCH: 32.3 pg (ref 26.0–34.0)
MCHC: 35.8 g/dL (ref 30.0–36.0)
MCV: 90.2 fL (ref 78.0–100.0)

## 2013-06-26 MED ORDER — CHLORHEXIDINE GLUCONATE 0.12 % MT SOLN
15.0000 mL | Freq: Two times a day (BID) | OROMUCOSAL | Status: DC
  Administered 2013-06-26: 15 mL via OROMUCOSAL
  Filled 2013-06-26: qty 15

## 2013-06-26 MED ORDER — BIOTENE DRY MOUTH MT LIQD
15.0000 mL | Freq: Two times a day (BID) | OROMUCOSAL | Status: DC
  Administered 2013-06-26 (×2): 15 mL via OROMUCOSAL

## 2013-06-26 NOTE — Progress Notes (Signed)
NG tube discontinued 0 residual after 6 hours of clamping tube

## 2013-06-26 NOTE — Progress Notes (Signed)
Subjective: Pt is much improved, abdominal pain is minimal and his distension is resolving.  Pt hasn't been OOB yet.  He's passing flatus but no BM yet (last one yesterday morning).  No N/V.  Has not required pain meds since 1827 yesterday.  Objective: Vital signs in last 24 hours: Temp:  [97.8 F (36.6 C)-98.3 F (36.8 C)] 98.3 F (36.8 C) (10/03 0528) Pulse Rate:  [49-60] 52 (10/03 0528) Resp:  [16-18] 18 (10/03 0528) BP: (104-137)/(67-79) 104/67 mmHg (10/03 0528) SpO2:  [97 %-100 %] 98 % (10/03 0528) Weight:  [167 lb (75.751 kg)-172 lb 13.5 oz (78.4 kg)] 172 lb 13.5 oz (78.4 kg) (10/02 2100) Last BM Date: 06/25/13  Intake/Output from previous day: 10/02 0701 - 10/03 0700 In: 958.3 [I.V.:958.3] Out: 1575 [Urine:450; Emesis/NG output:1125] Intake/Output this shift:    PE: Gen:  Alert, NAD, pleasant Abd: Soft, minimal tenderness, mild distension, +BS, no HSM, abdominal scars noted   Lab Results:   Recent Labs  06/25/13 1710 06/26/13 0615  WBC 10.8* 9.8  HGB 16.3 13.5  HCT 45.0 37.7*  PLT 230 199   BMET  Recent Labs  06/25/13 1710 06/26/13 0615  NA 139 138  K 3.8 3.5  CL 98 103  CO2 28 26  GLUCOSE 115* 119*  BUN 14 12  CREATININE 1.06 1.08  CALCIUM 10.3 8.8   PT/INR No results found for this basename: LABPROT, INR,  in the last 72 hours CMP     Component Value Date/Time   NA 138 06/26/2013 0615   K 3.5 06/26/2013 0615   CL 103 06/26/2013 0615   CO2 26 06/26/2013 0615   GLUCOSE 119* 06/26/2013 0615   BUN 12 06/26/2013 0615   CREATININE 1.08 06/26/2013 0615   CALCIUM 8.8 06/26/2013 0615   PROT 8.0 06/25/2013 1710   ALBUMIN 4.7 06/25/2013 1710   AST 30 06/25/2013 1710   ALT 21 06/25/2013 1710   ALKPHOS 92 06/25/2013 1710   BILITOT 0.8 06/25/2013 1710   GFRNONAA 68* 06/26/2013 0615   GFRAA 79* 06/26/2013 0615   Lipase     Component Value Date/Time   LIPASE 37 06/25/2013 1710       Studies/Results: Ct Abdomen Pelvis W Contrast  06/25/2013   CLINICAL  DATA:  Abdominal distention and pain.  EXAM: CT ABDOMEN AND PELVIS WITH CONTRAST  TECHNIQUE: Multidetector CT imaging of the abdomen and pelvis was performed using the standard protocol following bolus administration of intravenous contrast.  CONTRAST:  OMNIPAQUE IOHEXOL 300 MG/ML  SOLN  COMPARISON:  None.  FINDINGS: The lung bases are grossly clear. Moderate breathing motion artifact. Streaky subsegmental bibasilar atelectasis is noted.  The liver is unremarkable. No focal lesions or biliary dilatation. The gallbladder is normal. No common bowel duct dilatation. The pancreas is normal. The spleen is normal. The adrenal glands and kidneys are normal.  The stomach is distended with contrast and air. The proximal small bowel is also distended with scattered air-fluid levels. Findings consistent with a small bowel obstruction. There is a transition to normal/decompressed mid distal ileal loops of small bowel. Suspect adhesions in the left lower abdomen at the level of the iliac crest. No obstructing mass or inflammatory changes. The colon is unremarkable. Moderate sigmoid diverticulosis without findings for acute diverticulitis.  The aorta is normal in caliber. The major branch vessels are normal. No mesenteric or retroperitoneal mass or adenopathy.  The bladder, prostate gland and seminal vesicles are unremarkable. There is a small amount of free pelvic  fluid likely from a small bowel obstruction.  The bony structures are intact. Bilateral pars defects noted at L5 with a grade 2 spondylolisthesis.  IMPRESSION: 1. CT findings consistent with a the mid small bowel obstruction likely due to adhesions in the left mid abdomen. No obstructing mass. 2. Diverticulosis of the sigmoid colon but no findings for diverticulitis. 3. The solid abdominal organs are unremarkable.   Electronically Signed   By: Loralie Champagne M.D.   On: 06/25/2013 20:10    Anti-infectives: Anti-infectives   None        Assessment/Plan SBO 2* adhesions (s/p robotic prostatectomy and b/l inguinal hernia repairs) 1.  Conservative management:  IVF, pain control, antiemetics 2.  Can try clamping trials given improvement and hopefully d/c NG today if improved 3.  Ambulate and IS 4.  SCD's and heparin 5.  Pending KUB this am - if resolved likely remove NG    LOS: 1 day    DORT, Anjelika Ausburn 06/26/2013, 7:41 AM Pager: [phone]

## 2013-06-26 NOTE — Progress Notes (Signed)
Agree with above, clinically better, film better

## 2013-06-27 DIAGNOSIS — K56609 Unspecified intestinal obstruction, unspecified as to partial versus complete obstruction: Secondary | ICD-10-CM | POA: Diagnosis not present

## 2013-06-27 MED ORDER — PANTOPRAZOLE SODIUM 40 MG PO TBEC: 40.0000 mg | DELAYED_RELEASE_TABLET | Freq: Every day | ORAL | Status: DC

## 2013-06-27 NOTE — Progress Notes (Signed)
Pt and wife given discharge instructions.  They verbalized understanding of all instructions and follow up information.  Pt refused wheelchair, so RN walked with him downstairs for discharge home with his wife.

## 2013-06-27 NOTE — Progress Notes (Signed)
Subjective: Passing flatus, no bm yet, told diet just fine, no n/v, ng out  Objective: Vital signs in last 24 hours: Temp:  [97.4 F (36.3 C)-97.6 F (36.4 C)] 97.4 F (36.3 C) (10/04 0500) Pulse Rate:  [48-58] 58 (10/04 0500) Resp:  [18-19] 18 (10/04 0500) BP: (100-103)/(50-62) 102/50 mmHg (10/04 0500) SpO2:  [98 %-100 %] 98 % (10/04 0500) Last BM Date: 06/25/13  Intake/Output from previous day: 10/03 0701 - 10/04 0700 In: 2696 [P.O.:320; I.V.:2376] Out: -  Intake/Output this shift:    General appearance: no distress Resp: clear to auscultation bilaterally Cardio: regular rate and rhythm GI: soft nontender bs present  Lab Results:   Recent Labs  06/25/13 1710 06/26/13 0615  WBC 10.8* 9.8  HGB 16.3 13.5  HCT 45.0 37.7*  PLT 230 199   BMET  Recent Labs  06/25/13 1710 06/26/13 0615  NA 139 138  K 3.8 3.5  CL 98 103  CO2 28 26  GLUCOSE 115* 119*  BUN 14 12  CREATININE 1.06 1.08  CALCIUM 10.3 8.8   PT/INR No results found for this basename: LABPROT, INR,  in the last 72 hours ABG No results found for this basename: PHART, PCO2, PO2, HCO3,  in the last 72 hours  Studies/Results: Dg Abd 1 View  06/26/2013   CLINICAL DATA:  Small bowel obstruction  EXAM: ABDOMEN - 1 VIEW  COMPARISON:  Yesterday  FINDINGS: NG tube is been placed. Gas is seen throughout the colon. Dilated small bowel loops have markedly improved and persist in the mid abdomen. Iodinated contrast fills the bladder. No obvious free intraperitoneal gas. Mild gastric distension.  IMPRESSION: Small bowel obstruction has a markedly improved after NG tube placement. Some small bowel gas persists in the mid abdomen.   Electronically Signed   By: Maryclare Bean M.D.   On: 06/26/2013 08:52   Ct Abdomen Pelvis W Contrast  06/25/2013   CLINICAL DATA:  Abdominal distention and pain.  EXAM: CT ABDOMEN AND PELVIS WITH CONTRAST  TECHNIQUE: Multidetector CT imaging of the abdomen and pelvis was performed using the  standard protocol following bolus administration of intravenous contrast.  CONTRAST:  OMNIPAQUE IOHEXOL 300 MG/ML  SOLN  COMPARISON:  None.  FINDINGS: The lung bases are grossly clear. Moderate breathing motion artifact. Streaky subsegmental bibasilar atelectasis is noted.  The liver is unremarkable. No focal lesions or biliary dilatation. The gallbladder is normal. No common bowel duct dilatation. The pancreas is normal. The spleen is normal. The adrenal glands and kidneys are normal.  The stomach is distended with contrast and air. The proximal small bowel is also distended with scattered air-fluid levels. Findings consistent with a small bowel obstruction. There is a transition to normal/decompressed mid distal ileal loops of small bowel. Suspect adhesions in the left lower abdomen at the level of the iliac crest. No obstructing mass or inflammatory changes. The colon is unremarkable. Moderate sigmoid diverticulosis without findings for acute diverticulitis.  The aorta is normal in caliber. The major branch vessels are normal. No mesenteric or retroperitoneal mass or adenopathy.  The bladder, prostate gland and seminal vesicles are unremarkable. There is a small amount of free pelvic fluid likely from a small bowel obstruction.  The bony structures are intact. Bilateral pars defects noted at L5 with a grade 2 spondylolisthesis.  IMPRESSION: 1. CT findings consistent with a the mid small bowel obstruction likely due to adhesions in the left mid abdomen. No obstructing mass. 2. Diverticulosis of the sigmoid colon  but no findings for diverticulitis. 3. The solid abdominal organs are unremarkable.   Electronically Signed   By: Loralie Champagne M.D.   On: 06/25/2013 20:10    Anti-infectives: Anti-infectives   None      Assessment/Plan: Resolving sbo  Will get regular food this am, saline lock iv, if tolerates breakfast and lunch and doing well will discharge home  [facility] 06/27/2013

## 2013-06-27 NOTE — Progress Notes (Signed)
Pt tolerated breakfast and lunch well.  No complaints or pain/nausea.  Pt has been passing a lot of gas throughout the day.  Now, Pt finally has had a bowel movement.  He described it as a "good amount and formed, but soft".  Pt was still having some bloating.  Notified Dr Dwain Sarna.  Meanwhile pt had another BM and it has even become more "loose" and pt feels like going home today.  Awaiting orders from Dr Dwain Sarna.  Will cont to monitor.

## 2013-07-03 ENCOUNTER — Other Ambulatory Visit: Payer: Self-pay | Admitting: Orthopedic Surgery

## 2013-07-03 NOTE — Discharge Summary (Signed)
  Physician Discharge Summary  Patient ID: Derek [name] MRN: [id number] DOB/AGE: 69-May-1947 69 y.o.  Admit date: 06/25/2013 Discharge date: 07/03/2013  Admitting Diagnosis: SBO 2* adhesions  Discharge Diagnosis Patient Active Problem List   Diagnosis Date Noted  . Scrotal discoloration / purpling ?varicocele 09/08/2012  . Bilateral inguinal hernia (BIH) s/p lap repair 07/06/2012 06/24/2012  . OTHER MALAISE AND FATIGUE 04/27/2009  . PROSTATE CANCER, HX OF 11/10/2008  . CARCINOMA, BASAL CELL, HX OF 11/10/2008  . RHEUMATIC FEVER, HX OF 11/10/2008  . DIVERTICULITIS, HX OF 11/10/2008    Consultants None  Imaging: No results found.  Procedures None  Hospital Course:  67 y/o male developed abdominal pain and distention the evening of 06/24/13. He had difficulty sleeping. It persisted. He had associated nausea but no vomiting. Pain is relieved when he sits up. He came to [facility] for evaluation. Workup included CT scan of the abdomen and pelvis which reveal small bowel obstruction, likely due to adhesions. There were no complicating features seen.  Of note, he is status post robotic prostatectomy and bilateral inguinal hernia repairs.  Patient was admitted for conservative management including NG tube, bowel rest, and pain control.  He felt significantly better on HD #2 and thus his NG tube was clamped successfully without return of symptoms thus NG was discontinued.  Diet was advanced as tolerated.  On HD #3, the patient was voiding well, tolerating diet, ambulating well, pain well controlled, vital signs stable, and felt stable for discharge home.  Patient will follow up in our office as needed and knows to call with questions or concerns.     Medication List    Notice   You have not been prescribed any medications.           Follow-up Information   Follow up with Emelia Loron, MD. (As needed)    Specialty:  General Surgery   Contact information:   [address] [place] [address] [phone]       Signed: Candiss Norse Ku [facility] Surgery [phone]  07/03/2013, 2:17 PM

## 2013-07-07 ENCOUNTER — Encounter (HOSPITAL_BASED_OUTPATIENT_CLINIC_OR_DEPARTMENT_OTHER): Payer: Self-pay | Admitting: *Deleted

## 2013-07-07 NOTE — Progress Notes (Signed)
Pt had bilat ing hernias 10/13,06/25/13-in hospital with SBO-cleared without surgery-feels well now-ekg and labs done 06/26/13 No other tests needed

## 2013-07-10 ENCOUNTER — Encounter (HOSPITAL_BASED_OUTPATIENT_CLINIC_OR_DEPARTMENT_OTHER)
Admission: RE | Disposition: A | Discharge status: Home / Self Care | Payer: Self-pay | Source: Ambulatory Visit | Attending: Orthopedic Surgery

## 2013-07-10 ENCOUNTER — Encounter (HOSPITAL_BASED_OUTPATIENT_CLINIC_OR_DEPARTMENT_OTHER): Payer: Medicare Other | Admitting: *Deleted

## 2013-07-10 ENCOUNTER — Ambulatory Visit (HOSPITAL_BASED_OUTPATIENT_CLINIC_OR_DEPARTMENT_OTHER): Payer: Medicare Other | Admitting: *Deleted

## 2013-07-10 ENCOUNTER — Ambulatory Visit (HOSPITAL_BASED_OUTPATIENT_CLINIC_OR_DEPARTMENT_OTHER)
Admission: RE | Admit: 2013-07-10 | Discharge: 2013-07-10 | Disposition: A | Discharge status: Home / Self Care | Payer: Medicare Other | Source: Ambulatory Visit | Attending: Orthopedic Surgery | Admitting: Orthopedic Surgery

## 2013-07-10 ENCOUNTER — Encounter (HOSPITAL_BASED_OUTPATIENT_CLINIC_OR_DEPARTMENT_OTHER): Payer: Self-pay | Admitting: *Deleted

## 2013-07-10 DIAGNOSIS — M67919 Unspecified disorder of synovium and tendon, unspecified shoulder: Secondary | ICD-10-CM | POA: Diagnosis not present

## 2013-07-10 DIAGNOSIS — M75102 Unspecified rotator cuff tear or rupture of left shoulder, not specified as traumatic: Secondary | ICD-10-CM | POA: Diagnosis present

## 2013-07-10 DIAGNOSIS — M719 Bursopathy, unspecified: Secondary | ICD-10-CM | POA: Insufficient documentation

## 2013-07-10 DIAGNOSIS — M75120 Complete rotator cuff tear or rupture of unspecified shoulder, not specified as traumatic: Secondary | ICD-10-CM | POA: Diagnosis not present

## 2013-07-10 DIAGNOSIS — M25819 Other specified joint disorders, unspecified shoulder: Secondary | ICD-10-CM | POA: Diagnosis not present

## 2013-07-10 DIAGNOSIS — G8918 Other acute postprocedural pain: Secondary | ICD-10-CM | POA: Diagnosis not present

## 2013-07-10 HISTORY — PX: SHOULDER ARTHROSCOPY WITH ROTATOR CUFF REPAIR AND SUBACROMIAL DECOMPRESSION: SHX5686

## 2013-07-10 HISTORY — DX: Unspecified rotator cuff tear or rupture of left shoulder, not specified as traumatic: M75.102

## 2013-07-10 LAB — POCT HEMOGLOBIN-HEMACUE: Hemoglobin: 13.6 g/dL (ref 13.0–17.0)

## 2013-07-10 SURGERY — SHOULDER ARTHROSCOPY WITH ROTATOR CUFF REPAIR AND SUBACROMIAL DECOMPRESSION
Anesthesia: Regional | Site: Shoulder | Laterality: Left | Wound class: Clean

## 2013-07-10 MED ORDER — OXYCODONE HCL 5 MG/5ML PO SOLN: 5.0000 mg | Freq: Once | ORAL | Status: DC | PRN

## 2013-07-10 MED ORDER — HYDROMORPHONE HCL PF 1 MG/ML IJ SOLN
0.2500 mg | INTRAMUSCULAR | Status: DC | PRN
Start: 2013-07-10 — End: 2013-07-10

## 2013-07-10 MED ORDER — LIDOCAINE HCL (CARDIAC) 20 MG/ML IV SOLN
INTRAVENOUS | Status: DC | PRN
  Administered 2013-07-10: 40 mg via INTRAVENOUS

## 2013-07-10 MED ORDER — SODIUM CHLORIDE 0.9 % IR SOLN
Status: DC | PRN
  Administered 2013-07-10: 17000 mL

## 2013-07-10 MED ORDER — LACTATED RINGERS IV SOLN
INTRAVENOUS | Status: DC
  Administered 2013-07-10 (×2): via INTRAVENOUS

## 2013-07-10 MED ORDER — BUPIVACAINE-EPINEPHRINE PF 0.5-1:200000 % IJ SOLN
INTRAMUSCULAR | Status: DC | PRN
  Administered 2013-07-10: 30 mL

## 2013-07-10 MED ORDER — DEXAMETHASONE SODIUM PHOSPHATE 4 MG/ML IJ SOLN
INTRAMUSCULAR | Status: DC | PRN
  Administered 2013-07-10: 10 mg via INTRAVENOUS

## 2013-07-10 MED ORDER — PROPOFOL 10 MG/ML IV BOLUS
INTRAVENOUS | Status: DC | PRN
  Administered 2013-07-10: 140 mg via INTRAVENOUS

## 2013-07-10 MED ORDER — SUCCINYLCHOLINE CHLORIDE 20 MG/ML IJ SOLN
INTRAMUSCULAR | Status: AC
  Filled 2013-07-10: qty 1

## 2013-07-10 MED ORDER — MIDAZOLAM HCL 2 MG/2ML IJ SOLN
INTRAMUSCULAR | Status: AC
  Filled 2013-07-10: qty 2

## 2013-07-10 MED ORDER — EPHEDRINE SULFATE 50 MG/ML IJ SOLN
INTRAMUSCULAR | Status: DC | PRN
  Administered 2013-07-10: 5 mg via INTRAVENOUS

## 2013-07-10 MED ORDER — OXYCODONE-ACETAMINOPHEN 10-325 MG PO TABS: 1.0000 | ORAL_TABLET | Freq: Four times a day (QID) | ORAL | Status: DC | PRN

## 2013-07-10 MED ORDER — METHOCARBAMOL 500 MG PO TABS: 500.0000 mg | ORAL_TABLET | Freq: Four times a day (QID) | ORAL | Status: DC

## 2013-07-10 MED ORDER — CEFAZOLIN SODIUM-DEXTROSE 2-3 GM-% IV SOLR
INTRAVENOUS | Status: AC
  Filled 2013-07-10: qty 50

## 2013-07-10 MED ORDER — ONDANSETRON HCL 4 MG PO TABS: 4.0000 mg | ORAL_TABLET | Freq: Three times a day (TID) | ORAL | Status: DC | PRN

## 2013-07-10 MED ORDER — FENTANYL CITRATE 0.05 MG/ML IJ SOLN
INTRAMUSCULAR | Status: AC
  Filled 2013-07-10: qty 2

## 2013-07-10 MED ORDER — FENTANYL CITRATE 0.05 MG/ML IJ SOLN
50.0000 ug | INTRAMUSCULAR | Status: DC | PRN
  Administered 2013-07-10: 100 ug via INTRAVENOUS

## 2013-07-10 MED ORDER — SUCCINYLCHOLINE CHLORIDE 20 MG/ML IJ SOLN
INTRAMUSCULAR | Status: DC | PRN
  Administered 2013-07-10: 100 mg via INTRAVENOUS

## 2013-07-10 MED ORDER — FENTANYL CITRATE 0.05 MG/ML IJ SOLN
INTRAMUSCULAR | Status: AC
  Filled 2013-07-10: qty 6

## 2013-07-10 MED ORDER — CEFAZOLIN SODIUM-DEXTROSE 2-3 GM-% IV SOLR
2.0000 g | INTRAVENOUS | Status: AC
  Administered 2013-07-10: 2 g via INTRAVENOUS

## 2013-07-10 MED ORDER — ONDANSETRON HCL 4 MG/2ML IJ SOLN
INTRAMUSCULAR | Status: DC | PRN
  Administered 2013-07-10: 4 mg via INTRAMUSCULAR

## 2013-07-10 MED ORDER — OXYCODONE HCL 5 MG PO TABS: 5.0000 mg | ORAL_TABLET | Freq: Once | ORAL | Status: DC | PRN

## 2013-07-10 MED ORDER — PROPOFOL 10 MG/ML IV EMUL
INTRAVENOUS | Status: AC
  Filled 2013-07-10: qty 50

## 2013-07-10 MED ORDER — MIDAZOLAM HCL 2 MG/2ML IJ SOLN
1.0000 mg | INTRAMUSCULAR | Status: DC | PRN
  Administered 2013-07-10: 2 mg via INTRAVENOUS

## 2013-07-10 SURGICAL SUPPLY — 68 items
ANCH SUT SWLK 19.1X4.75 (Anchor) ×2 IMPLANT
ANCHOR SUT BIO SW 4.75X19.1 (Anchor) ×2 IMPLANT
APL SKNCLS STERI-STRIP NONHPOA (GAUZE/BANDAGES/DRESSINGS) ×1
BENZOIN TINCTURE PRP APPL 2/3 (GAUZE/BANDAGES/DRESSINGS) ×2 IMPLANT
BLADE CUTTER GATOR 3.5 (BLADE) ×2 IMPLANT
BLADE GREAT WHITE 4.2 (BLADE) IMPLANT
BLADE SURG 15 STRL LF DISP TIS (BLADE) IMPLANT
BLADE SURG 15 STRL SS (BLADE)
BUR OVAL 4.0 (BURR) IMPLANT
BUR OVAL 6.0 (BURR) IMPLANT
CANISTER OMNI JUG 16 LITER (MISCELLANEOUS) ×3 IMPLANT
CANNULA 5.75X71 LONG (CANNULA) ×2 IMPLANT
CANNULA TWIST IN 8.25X7CM (CANNULA) ×2 IMPLANT
DECANTER SPIKE VIAL GLASS SM (MISCELLANEOUS) IMPLANT
DRAPE INCISE IOBAN 66X45 STRL (DRAPES) ×2 IMPLANT
DRAPE SHOULDER BEACH CHAIR (DRAPES) ×2 IMPLANT
DRAPE U 20/CS (DRAPES) ×2 IMPLANT
DRAPE U-SHAPE 47X51 STRL (DRAPES) ×2 IMPLANT
DRSG PAD ABDOMINAL 8X10 ST (GAUZE/BANDAGES/DRESSINGS) ×2 IMPLANT
DURAPREP 26ML APPLICATOR (WOUND CARE) ×2 IMPLANT
ELECT REM PT RETURN 9FT ADLT (ELECTROSURGICAL)
ELECTRODE REM PT RTRN 9FT ADLT (ELECTROSURGICAL) ×1 IMPLANT
FIBERSTICK 2 (SUTURE) IMPLANT
GLOVE BIO SURGEON STRL SZ8 (GLOVE) ×2 IMPLANT
GLOVE BIOGEL PI IND STRL 7.0 (GLOVE) IMPLANT
GLOVE BIOGEL PI IND STRL 8 (GLOVE) ×2 IMPLANT
GLOVE BIOGEL PI INDICATOR 7.0 (GLOVE) ×2
GLOVE BIOGEL PI INDICATOR 8 (GLOVE) ×2
GLOVE ECLIPSE 6.5 STRL STRAW (GLOVE) ×1 IMPLANT
GLOVE ORTHO TXT STRL SZ7.5 (GLOVE) ×2 IMPLANT
GOWN BRE IMP PREV XXLGXLNG (GOWN DISPOSABLE) ×4 IMPLANT
GOWN PREVENTION PLUS XLARGE (GOWN DISPOSABLE) ×2 IMPLANT
IMMOBILIZER SHOULDER XLGE (ORTHOPEDIC SUPPLIES) IMPLANT
IV NS IRRIG 3000ML ARTHROMATIC (IV SOLUTION) ×6 IMPLANT
KIT SHOULDER TRACTION (DRAPES) ×2 IMPLANT
LASSO SUT 90 DEGREE (SUTURE) IMPLANT
NDL SCORPION MULTI FIRE (NEEDLE) IMPLANT
NEEDLE SCORPION MULTI FIRE (NEEDLE) ×2 IMPLANT
PACK ARTHROSCOPY DSU (CUSTOM PROCEDURE TRAY) ×2 IMPLANT
PACK BASIN DAY SURGERY FS (CUSTOM PROCEDURE TRAY) ×2 IMPLANT
SET ARTHROSCOPY TUBING (MISCELLANEOUS) ×2
SET ARTHROSCOPY TUBING LN (MISCELLANEOUS) ×1 IMPLANT
SHEET MEDIUM DRAPE 40X70 STRL (DRAPES) ×2 IMPLANT
SLEEVE SCD COMPRESS KNEE MED (MISCELLANEOUS) ×2 IMPLANT
SLING ARM FOAM STRAP LRG (SOFTGOODS) IMPLANT
SLING ARM FOAM STRAP MED (SOFTGOODS) IMPLANT
SLING ARM FOAM STRAP XLG (SOFTGOODS) IMPLANT
SLING ARM IMMOBILIZER LRG (SOFTGOODS) ×1 IMPLANT
SLING ARM IMMOBILIZER MED (SOFTGOODS) IMPLANT
SPONGE GAUZE 4X4 12PLY (GAUZE/BANDAGES/DRESSINGS) ×2 IMPLANT
STRIP CLOSURE SKIN 1/2X4 (GAUZE/BANDAGES/DRESSINGS) ×2 IMPLANT
SUT FIBERWIRE #2 38 T-5 BLUE (SUTURE) ×2
SUT LASSO 45 DEGREE (SUTURE) IMPLANT
SUT LASSO 45 DEGREE LEFT (SUTURE) IMPLANT
SUT LASSO 45D RIGHT (SUTURE) IMPLANT
SUT MNCRL AB 4-0 PS2 18 (SUTURE) IMPLANT
SUT PDS AB 1 CT  36 (SUTURE)
SUT PDS AB 1 CT 36 (SUTURE) IMPLANT
SUT TIGER TAPE 7 IN WHITE (SUTURE) ×1 IMPLANT
SUT VIC AB 3-0 SH 27 (SUTURE)
SUT VIC AB 3-0 SH 27X BRD (SUTURE) IMPLANT
SUTURE FIBERWR #2 38 T-5 BLUE (SUTURE) IMPLANT
TAPE FIBER 2MM 7IN #2 BLUE (SUTURE) ×2 IMPLANT
TOWEL OR 17X24 6PK STRL BLUE (TOWEL DISPOSABLE) ×2 IMPLANT
TOWEL OR NON WOVEN STRL DISP B (DISPOSABLE) ×2 IMPLANT
TUBE CONNECTING 20X1/4 (TUBING) IMPLANT
WAND STAR VAC 90 (SURGICAL WAND) ×2 IMPLANT
WATER STERILE IRR 1000ML POUR (IV SOLUTION) ×2 IMPLANT

## 2013-07-10 NOTE — Progress Notes (Signed)
Assisted Dr. Fitzgerald with left, ultrasound guided, interscalene  block. Side rails up, monitors on throughout procedure. See vital signs in flow sheet. Tolerated Procedure well. 

## 2013-07-10 NOTE — Anesthesia Preprocedure Evaluation (Signed)
Anesthesia Evaluation  Patient identified by MRN, date of birth, ID band Patient awake    Reviewed: Allergy & Precautions, H&P , NPO status , Patient's Chart, lab work & pertinent test results  Airway Mallampati: I TM Distance: >3 FB Neck ROM: Full    Dental no notable dental hx. (+) Teeth Intact and Dental Advisory Given   Pulmonary neg pulmonary ROS,    Pulmonary exam normal       Cardiovascular negative cardio ROS      Neuro/Psych negative neurological ROS  negative psych ROS   GI/Hepatic negative GI ROS, Neg liver ROS,   Endo/Other  negative endocrine ROS  Renal/GU negative Renal ROS  negative genitourinary   Musculoskeletal   Abdominal   Peds  Hematology negative hematology ROS (+)   Anesthesia Other Findings   Reproductive/Obstetrics negative OB ROS                           Anesthesia Physical Anesthesia Plan  ASA: II  Anesthesia Plan: General and Regional   Post-op Pain Management:    Induction: Intravenous  Airway Management Planned: Oral ETT  Additional Equipment:   Intra-op Plan:   Post-operative Plan: Extubation in OR  Informed Consent: I have reviewed the patients History and Physical, chart, labs and discussed the procedure including the risks, benefits and alternatives for the proposed anesthesia with the patient or authorized representative who has indicated his/her understanding and acceptance.   Dental advisory given  Plan Discussed with: CRNA  Anesthesia Plan Comments:         Anesthesia Quick Evaluation

## 2013-07-10 NOTE — Anesthesia Postprocedure Evaluation (Signed)
  Anesthesia Post-op Note  Patient: Derek [name]  Procedure(s) Performed: Procedure(s): LEFT SHOULDER ARTHROSCOPY WITH ARTHROSCOPIC ROTATOR CUFF REPAIR AND SUBACROMIAL DECOMPRESSION, PARTIAL ACROMIOPLASTY WITH CORACROMIAL RELEASE (Left)  Patient Location: PACU  Anesthesia Type:General and block  Level of Consciousness: awake and alert   Airway and Oxygen Therapy: Patient Spontanous Breathing  Post-op Pain: none  Post-op Assessment: Post-op Vital signs reviewed, Patient's Cardiovascular Status Stable and Respiratory Function Stable  Post-op Vital Signs: Reviewed  Filed Vitals:   07/10/13 1015  BP: 113/65  Pulse: 46  Temp:   Resp: 13    Complications: No apparent anesthesia complications

## 2013-07-10 NOTE — Transfer of Care (Signed)
Immediate Anesthesia Transfer of Care Note  Patient: SATOSHI KALAS  Procedure(s) Performed: Procedure(s): LEFT SHOULDER ARTHROSCOPY WITH ARTHROSCOPIC ROTATOR CUFF REPAIR AND SUBACROMIAL DECOMPRESSION, PARTIAL ACROMIOPLASTY WITH CORACROMIAL RELEASE (Left)  Patient Location: PACU  Anesthesia Type:GA combined with regional for post-op pain  Level of Consciousness: awake, alert  and oriented  Airway & Oxygen Therapy: Patient Spontanous Breathing and Patient connected to face mask oxygen  Post-op Assessment: Report given to PACU RN, Post -op Vital signs reviewed and stable and Patient moving all extremities  Post vital signs: Reviewed and stable  Complications: No apparent anesthesia complications

## 2013-07-10 NOTE — H&P (Signed)
PREOPERATIVE H&P  Chief Complaint: LEFT SHOULDER RUPTURE OF ROTATOR CUFF, DISORDERS OF BURSAE AND TENDONS  HPI: Derek [name] is a 67 y.o. male who presents for preoperative history and physical with a diagnosis of LEFT SHOULDER RUPTURE OF ROTATOR CUFF, DISORDERS OF BURSAE AND TENDONS. Symptoms are rated as moderate to severe, and have been worsening.  This is significantly impairing activities of daily living.  He has elected for surgical management. He has failed anti-inflammatories, injections, activity modification, and has had a previous right shoulder rotator cuff repair that was repaired.  Past Medical History  Diagnosis Date  . Cancer of prostate     pT2c No Mx, Gleason 3+4=7  . Diverticulitis   . Kidney stones   . Rheumatic fever 1952-1953  . Basal cell carcinoma 2009    back  . Hypogonadism male   . Nephrolithiasis    Past Surgical History  Procedure Laterality Date  . Rotator cuff surgery  2002    Gioffre-rt  . Knee surgery  2005    Duda- arthroscopy, partial medial menisectomy-lt  . Prostate surgery  12/19/06    robotic prostatectomy  . Bilateral inguinal hernia repair  07/10/2012    lap BIH repairs  . Hernia repair  07/10/12    LIH/rih-umb  . Colonoscopy     History   Social History  . Marital Status: Married    Spouse Name: N/A    Number of Children: N/A  . Years of Education: N/A   Occupational History  . retired Systems analyst) Herbalist   Social History Main Topics  . Smoking status: Former Smoker    Quit date: 09/25/1971  . Smokeless tobacco: Never Used  . Alcohol Use: No  . Drug Use: No  . Sexual Activity: None   Other Topics Concern  . None   Social History Narrative   Regular exercise: yes   Runner, former marathon runner   Family History  Problem Relation Age of Onset  . Breast cancer Mother   . Cancer Brother 51    brain tumor  . Heart disease      fam hx  . Arthritis Other     other relative  . Prostate cancer  Other     nephew  . Lung cancer Other 24    nephew   No Known Allergies Prior to Admission medications   Not on File     Positive ROS: All other systems have been reviewed and were otherwise negative with the exception of those mentioned in the HPI and as above.  Physical Exam: General: Alert, no acute distress Cardiovascular: No pedal edema Respiratory: No cyanosis, no use of accessory musculature GI: No organomegaly, abdomen is soft and non-tender Skin: No lesions in the area of chief complaint Neurologic: Sensation intact distally Psychiatric: Patient is competent for consent with normal mood and affect Lymphatic: No axillary or cervical lymphadenopathy  MUSCULOSKELETAL: Left shoulder active forward flexion is 0-160. Supraspinatus strength is weak secondary to pain. Minimal pain over the a.c. joint. Infraspinatus strength is intact.  MRI demonstrates evidence for a high-grade supraspinatus tear, that is partial, but may be nearly full thickness.  Assessment: LEFT SHOULDER RUPTURE OF ROTATOR CUFF, DISORDERS OF BURSAE AND TENDONS  Plan: Plan for Procedure(s): LEFT SHOULDER ARTHROSCOPY WITH ROTATOR CUFF REPAIR AND SUBACROMIAL DECOMPRESSION, PARTIAL ACROMIOPLASTY WITH CORACROMIAL RELEASE  The risks benefits and alternatives were discussed with the patient including but not limited to the risks of nonoperative treatment, versus surgical intervention including infection,  bleeding, nerve injury,  blood clots, cardiopulmonary complications, morbidity, mortality, among others, and they were willing to proceed.   Eulas Post, MD Cell [phone]   07/10/2013 7:32 AM

## 2013-07-10 NOTE — Anesthesia Procedure Notes (Addendum)
Anesthesia Regional Block:  Interscalene brachial plexus block  Pre-Anesthetic Checklist: ,, timeout performed, Correct Patient, Correct Site, Correct Laterality, Correct Procedure, Correct Position, site marked, Risks and benefits discussed, pre-op evaluation,  At surgeon's request and post-op pain management  Laterality: Left  Prep: Maximum Sterile Barrier Precautions used and chloraprep       Needles:  Injection technique: Single-shot  Needle Type: Echogenic Stimulator Needle     Needle Length: 5cm 5 cm Needle Gauge: 22 and 22 G    Additional Needles:  Procedures: ultrasound guided (picture in chart) and nerve stimulator Interscalene brachial plexus block  Nerve Stimulator or Paresthesia:  Response: Biceps response,   Additional Responses:   Narrative:  Start time: 07/10/2013 6:57 AM End time: 07/10/2013 7:08 AM Injection made incrementally with aspirations every 5 mL. Anesthesiologist: Sampson Goon, MD  Additional Notes: 2% Lidocaine skin wheel.   Interscalene brachial plexus block Procedure Name: Intubation Date/Time: 07/10/2013 7:43 AM Performed by: Teryl Lucy P Pre-anesthesia Checklist: Patient identified, Emergency Drugs available, Suction available and Patient being monitored Patient Re-evaluated:Patient Re-evaluated prior to inductionOxygen Delivery Method: Circle System Utilized Preoxygenation: Pre-oxygenation with 100% oxygen Intubation Type: IV induction Ventilation: Mask ventilation without difficulty Laryngoscope Size: Miller and 2 Grade View: Grade I Tube type: Oral Tube size: 8.0 mm Number of attempts: 1 Airway Equipment and Method: stylet and oral airway Placement Confirmation: ETT inserted through vocal cords under direct vision,  positive ETCO2 and breath sounds checked- equal and bilateral Secured at: 22 cm Tube secured with: Tape Dental Injury: Teeth and Oropharynx as per pre-operative assessment

## 2013-07-10 NOTE — Op Note (Signed)
07/10/2013  9:44 AM  PATIENT:  Derek [name]    PRE-OPERATIVE DIAGNOSIS:  LEFT SHOULDER RUPTURE OF ROTATOR CUFF, DISORDERS OF BURSAE AND TENDONS, impingement syndrome  POST-OPERATIVE DIAGNOSIS:  Left shoulder full-thickness supraspinatus tear, impingement syndrome, superior, anterior, and posterior labral tear, glenoid articular cartilage damage 2 x 2 cm, grade 4 nearly full thickness.  PROCEDURE:  Left shoulder arthroscopy with extensive debridement of glenoid articular cartilage, labrum, with repair of supraspinatus tendon and leading edge of the infraspinatus tendon, with acromioplasty  SURGEON:  Eulas Post, MD  PHYSICIAN ASSISTANT: Janace Litten, OPA-C, present and scrubbed throughout the case, critical for completion in a timely fashion, and for retraction, instrumentation, and closure.  ANESTHESIA:   General  PREOPERATIVE INDICATIONS:  [name] is a  67 y.o. male with a diagnosis of LEFT SHOULDER RUPTURE OF ROTATOR CUFF, DISORDERS OF BURSAE AND TENDONS who failed conservative measures and elected for surgical management.    The risks benefits and alternatives were discussed with the patient preoperatively including but not limited to the risks of infection, bleeding, nerve injury, cardiopulmonary complications, the need for revision surgery, among others, and the patient was willing to proceed.  OPERATIVE IMPLANTS: Arthrex bio composite 4.75 mm swivel lock anchors x2, with a total of 2 inverted fiber tape sutures  OPERATIVE FINDINGS: The articular surface of the humeral head was intact, although the glenoid had fairly advanced changes, with a full-thickness chondral defect measuring about 2 x 2 centimeters. This was slightly elevated and delaminated off of the face of the glenoid. The labrum had anterior fraying, as well as superior and posterior fraying. The shoulder had nearly full motion during examination under anesthesia. The subscapularis had some fraying but was  intact. Biceps pulley was intact. The biceps had about 15% fraying which was debrided. There was substantial subacromial spurring. The supraspinatus had a tear that was about 2 x 2 centimeters. This was fairly large. This involved the leading edge of the infraspinatus as well. The tissue quality was fair. He was very mobile however. There was some atrophy, primarily posteriorly.   OPERATIVE PROCEDURE: The patient was brought to the operating room and placed in the supine position. General anesthesia was administered. IV antibiotics were given. The left upper extremity was examined and had full motion. Time out was performed. The left upper extremity was prepped and draped in usual sterile fashion. Diagnostic arthroscopy was carried out the above-named findings. I used the arthroscopic biter and the arthroscopic shaver as well as the grasper to remove the loose chondral debris. I also debrided the undersurface of the biceps, the undersurface of the supraspinatus, as well as the labrum superiorly and posteriorly and anteriorly with the shaver. I performed a slight amount of preparation of the greater tuberosity from the articular side using the shaver.  I then went to the subacromial space. Complete bursectomy was carried out. The CA ligament was released. The undersurface of the acromion was smoothened with a bur. This is a moderate acromioplasty. I also performed a light tubercleplasty, to prepare the bone for reimplantation of the tendon.  I then used a bird beak suture passer to pass a inverted fiber tape posteriorly, and passed a second fiber tape anteriorly using the scorpion suture passer. I placed a second superior cannula, and then anchored the posterior tendon down into a swivel lock, and then anchored the anterior tendon down as well. Excellent bony apposition was achieved. The shoulder was irrigated, I completed the acromioplasty from the lateral view,  and then removed the instruments. The portals  were closed with Monocryl followed by Steri-Strips and sterile gauze. He was awakened and returned to the PACU in stable and satisfactory condition. There were no complications and he tolerated the procedure well.

## 2013-07-14 ENCOUNTER — Encounter (HOSPITAL_BASED_OUTPATIENT_CLINIC_OR_DEPARTMENT_OTHER): Payer: Self-pay | Admitting: Orthopedic Surgery

## 2013-07-22 DIAGNOSIS — Z4789 Encounter for other orthopedic aftercare: Secondary | ICD-10-CM | POA: Diagnosis not present

## 2013-08-27 DIAGNOSIS — M25519 Pain in unspecified shoulder: Secondary | ICD-10-CM | POA: Diagnosis not present

## 2013-08-27 DIAGNOSIS — M75120 Complete rotator cuff tear or rupture of unspecified shoulder, not specified as traumatic: Secondary | ICD-10-CM | POA: Diagnosis not present

## 2013-09-01 DIAGNOSIS — M25519 Pain in unspecified shoulder: Secondary | ICD-10-CM | POA: Diagnosis not present

## 2013-09-01 DIAGNOSIS — M75120 Complete rotator cuff tear or rupture of unspecified shoulder, not specified as traumatic: Secondary | ICD-10-CM | POA: Diagnosis not present

## 2013-09-03 ENCOUNTER — Telehealth: Payer: Self-pay | Admitting: Family Medicine

## 2013-09-03 DIAGNOSIS — M75120 Complete rotator cuff tear or rupture of unspecified shoulder, not specified as traumatic: Secondary | ICD-10-CM | POA: Diagnosis not present

## 2013-09-03 DIAGNOSIS — M25519 Pain in unspecified shoulder: Secondary | ICD-10-CM | POA: Diagnosis not present

## 2013-09-03 NOTE — Telephone Encounter (Signed)
Patient Information:  Caller Name: Priscilla  Phone: [phone]  Patient: Derek, [name]  Gender: Male  DOB: 1946-05-02  Age: 67 Years  PCP: Hannah Beat (Family Practice)  Office Follow Up:  Does the office need to follow up with this patient?: No  Instructions For The Office: N/A  RN Note:  Reports the ProAir inhaler he had been given previously expired June 2014. Patient would like to get a refill on the inhaler, please contact his regarding this request. If calling after 5:30pm, call: [phone].  Symptoms  Reason For Call & Symptoms: Wheezing, dry cough. Wants to know if he can use the ProAir inhaler that was previously prescribed, or get a new refill on the medication.  Reviewed Health History In EMR: Yes  Reviewed Medications In EMR: Yes  Reviewed Allergies In EMR: Yes  Reviewed Surgeries / Procedures: Yes  Date of Onset of Symptoms: 08/21/2013  Treatments Tried: Coldeze, Dayquil, Nyquil  Treatments Tried Worked: Yes  Guideline(s) Used:  Cough  Disposition Per Guideline:   Go to Office Now  Reason For Disposition Reached:   Wheezing is present  Advice Given:  N/A  RN Overrode Recommendation:  Patient Requests Prescription  Requesting refill for a Proair inhaler.

## 2013-09-04 MED ORDER — ALBUTEROL SULFATE HFA 108 (90 BASE) MCG/ACT IN AERS: 2.0000 | INHALATION_SPRAY | Freq: Four times a day (QID) | RESPIRATORY_TRACT | Status: DC | PRN

## 2013-09-04 NOTE — Telephone Encounter (Signed)
Refill on ProAir sent to [facility] [address] [place].  Left message for Mr. Moring that refill has been sent to his pharmacy.

## 2013-09-07 DIAGNOSIS — M25519 Pain in unspecified shoulder: Secondary | ICD-10-CM | POA: Diagnosis not present

## 2013-09-07 DIAGNOSIS — Z4789 Encounter for other orthopedic aftercare: Secondary | ICD-10-CM | POA: Diagnosis not present

## 2013-09-07 DIAGNOSIS — M75120 Complete rotator cuff tear or rupture of unspecified shoulder, not specified as traumatic: Secondary | ICD-10-CM | POA: Diagnosis not present

## 2013-09-09 DIAGNOSIS — M25519 Pain in unspecified shoulder: Secondary | ICD-10-CM | POA: Diagnosis not present

## 2013-09-09 DIAGNOSIS — M75120 Complete rotator cuff tear or rupture of unspecified shoulder, not specified as traumatic: Secondary | ICD-10-CM | POA: Diagnosis not present

## 2013-09-09 DIAGNOSIS — Z4789 Encounter for other orthopedic aftercare: Secondary | ICD-10-CM | POA: Diagnosis not present

## 2013-09-14 DIAGNOSIS — M75120 Complete rotator cuff tear or rupture of unspecified shoulder, not specified as traumatic: Secondary | ICD-10-CM | POA: Diagnosis not present

## 2013-09-14 DIAGNOSIS — M25519 Pain in unspecified shoulder: Secondary | ICD-10-CM | POA: Diagnosis not present

## 2013-09-22 DIAGNOSIS — M75120 Complete rotator cuff tear or rupture of unspecified shoulder, not specified as traumatic: Secondary | ICD-10-CM | POA: Diagnosis not present

## 2013-09-22 DIAGNOSIS — M25519 Pain in unspecified shoulder: Secondary | ICD-10-CM | POA: Diagnosis not present

## 2013-09-23 DIAGNOSIS — M25519 Pain in unspecified shoulder: Secondary | ICD-10-CM | POA: Diagnosis not present

## 2013-09-23 DIAGNOSIS — M75120 Complete rotator cuff tear or rupture of unspecified shoulder, not specified as traumatic: Secondary | ICD-10-CM | POA: Diagnosis not present

## 2013-11-04 DIAGNOSIS — S43429A Sprain of unspecified rotator cuff capsule, initial encounter: Secondary | ICD-10-CM | POA: Diagnosis not present

## 2014-03-16 DIAGNOSIS — C61 Malignant neoplasm of prostate: Secondary | ICD-10-CM | POA: Diagnosis not present

## 2014-03-23 DIAGNOSIS — N529 Male erectile dysfunction, unspecified: Secondary | ICD-10-CM | POA: Diagnosis not present

## 2014-03-23 DIAGNOSIS — C61 Malignant neoplasm of prostate: Secondary | ICD-10-CM | POA: Diagnosis not present

## 2014-04-05 ENCOUNTER — Ambulatory Visit: Payer: No Typology Code available for payment source | Admitting: Internal Medicine

## 2014-04-05 ENCOUNTER — Ambulatory Visit (INDEPENDENT_AMBULATORY_CARE_PROVIDER_SITE_OTHER): Payer: Medicare Other | Admitting: Family Medicine

## 2014-04-05 ENCOUNTER — Encounter: Payer: Self-pay | Admitting: Family Medicine

## 2014-04-05 VITALS — BP 94/62 | HR 49 | Temp 97.7°F | Ht 71.0 in | Wt 169.5 lb

## 2014-04-05 DIAGNOSIS — J0100 Acute maxillary sinusitis, unspecified: Secondary | ICD-10-CM | POA: Diagnosis not present

## 2014-04-05 DIAGNOSIS — H8309 Labyrinthitis, unspecified ear: Secondary | ICD-10-CM | POA: Diagnosis not present

## 2014-04-05 DIAGNOSIS — H60399 Other infective otitis externa, unspecified ear: Secondary | ICD-10-CM

## 2014-04-05 DIAGNOSIS — H60393 Other infective otitis externa, bilateral: Secondary | ICD-10-CM

## 2014-04-05 MED ORDER — AMOXICILLIN 500 MG PO CAPS: 1000.0000 mg | ORAL_CAPSULE | Freq: Two times a day (BID) | ORAL | Status: DC

## 2014-04-05 MED ORDER — MECLIZINE HCL 25 MG PO TABS: 25.0000 mg | ORAL_TABLET | Freq: Three times a day (TID) | ORAL | Status: DC | PRN

## 2014-04-05 MED ORDER — NEOMYCIN-POLYMYXIN-HC 3.5-10000-1 OT SOLN: 4.0000 [drp] | Freq: Four times a day (QID) | OTIC | Status: DC

## 2014-04-05 NOTE — Progress Notes (Signed)
[place]rek [name] [address] Phone: [phone] Fax: [phone]  Patient ID: [name] MRN: [id number], DOB: [birth year], 68 y.o. Date of Encounter: 04/05/2014  Primary Physician:  [name], MD   Chief Complaint: Dizziness, Fatigue and Nasal Congestion   Subjective:   History of Present Illness:  Derek [name] is a 68 y.o. very pleasant male patient who presents with the following:  Fine up until last Thursday morning. Stopped and last Thursday and worked until about last Thursday, and thought that it was something that he ate.   Got really fatigued and felt bad. Went to the bathroom. A little loose stool. Then downhill, and then started to get a little dizzy and equilibrium off. Vertigo.   Ears were draining a little bit. Mild headache and all the way around. Eye were tired and burning. Some restriction in nose and some mild headache. Not too much energy.   Past Medical History, Surgical History, Social History, Family History, Problem List, Medications, and Allergies have been reviewed and updated if relevant.  Review of Systems:  GEN: No acute illnesses, no fevers, chills. GI: No n/v/d, eating normally Pulm: No SOB Interactive and getting along well at home.  Otherwise, ROS is as per the HPI.  Objective:   Physical Examination: BP 94/62  Pulse 49  Temp(Src) 97.7 F (36.5 C) (Oral)  Ht 5\' 11"  (1.803 m)  Wt 169 lb 8 oz (76.885 kg)  BMI 23.65 kg/m2   GEN: WDWN, NAD, Non-toxic, A & O x 3 HEENT: Atraumatic, Normocephalic. Neck supple. No masses, No LAD. Tender to palpation in the maxillary sinuses. Ears and Nose: No external deformity. TMs are clear. The patient does have some inducible vertigo with bending over at the waist and then standing up quickly. CV: RRR, No M/G/R. No JVD. No thrill. No extra heart sounds. PULM: CTA B, no wheezes, crackles, rhonchi. No retractions. No resp. distress. No accessory muscle use. EXTR: No c/c/e NEURO  Normal gait.  PSYCH: Normally interactive. Conversant. Not depressed or anxious appearing.  Calm demeanor.   Laboratory and Imaging Data:  Assessment & Plan:   Viral labyrinthitis, unspecified laterality  Otitis, externa, infective, bilateral  Acute maxillary sinusitis, recurrence not specified   Likely preceding viral syndrome, and then developed viral labyrinthitis.  Sinusitis, secondary to this. Also probable otitis externa, partially treated with drops and the patient had at home. Now the ears and he looked pretty good, but I suspect that he did have a true otitis externa.  New Prescriptions   AMOXICILLIN (AMOXIL) 500 MG CAPSULE    Take 2 capsules (1,000 mg total) by mouth 2 (two) times daily.   MECLIZINE (ANTIVERT) 25 MG TABLET    Take 1 tablet (25 mg total) by mouth 3 (three) times daily as needed for dizziness.   NEOMYCIN-POLYMYXIN-HYDROCORTISONE (CORTISPORIN) OTIC SOLUTION    Place 4 drops into both ears 4 (four) times daily.   Modified Medications   No medications on file   No orders of the defined types were placed in this encounter.   Follow-up: No Follow-up on file. Unless noted above, the patient is to follow-up if symptoms worsen. Red flags were reviewed with the patient.  Signed,  Maud Deed. Breck Hollinger, MD, [facility]   Discontinued Medications   ALBUTEROL (PROAIR HFA) 108 (90 BASE) MCG/ACT INHALER    Inhale 2 puffs into the lungs every 6 (six) hours as needed for wheezing or shortness of breath.   METHOCARBAMOL (ROBAXIN) 500  MG TABLET    Take 1 tablet (500 mg total) by mouth 4 (four) times daily.   ONDANSETRON (ZOFRAN) 4 MG TABLET    Take 1 tablet (4 mg total) by mouth every 8 (eight) hours as needed for nausea.   OXYCODONE-ACETAMINOPHEN (PERCOCET) 10-325 MG PER TABLET    Take 1-2 tablets by mouth every 6 (six) hours as needed for pain. MAXIMUM TOTAL ACETAMINOPHEN DOSE IS 4000 MG PER DAY   Current Medications at Discharge:   Medication List         This list is accurate as of: 04/05/14  1:44 PM.  Always use your most recent med list.               amoxicillin 500 MG capsule  Commonly known as:  AMOXIL  Take 2 capsules (1,000 mg total) by mouth 2 (two) times daily.     meclizine 25 MG tablet  Commonly known as:  ANTIVERT  Take 1 tablet (25 mg total) by mouth 3 (three) times daily as needed for dizziness.     neomycin-polymyxin-hydrocortisone otic solution  Commonly known as:  CORTISPORIN  Place 4 drops into both ears 4 (four) times daily.

## 2014-04-05 NOTE — Progress Notes (Signed)
Pre visit review using our clinic review tool, if applicable. No additional management support is needed unless otherwise documented below in the visit note. 

## 2014-04-13 IMAGING — CT CT ABD-PELV W/ CM
2 of 5 series · 14 of 46 positions shown, 16 images · IV contrast (APPLIED)
Comparison: None.

CLINICAL DATA: Abdominal distention and pain.

EXAM:
CT ABDOMEN AND PELVIS WITH CONTRAST
TECHNIQUE: Multidetector CT imaging of the abdomen and pelvis was performed
using the standard protocol following bolus administration of
intravenous contrast.
CONTRAST:  100mL OMNIPAQUE IOHEXOL 300 MG/ML  SOLN

[Series 2: abd/ pelvis 5.0 i30f 1 · axial · 0.84mm/px · z∈[+930,+1370]mm · 11 of 100 slices shown, 13 images]
[im 6/100  soft-tissue]
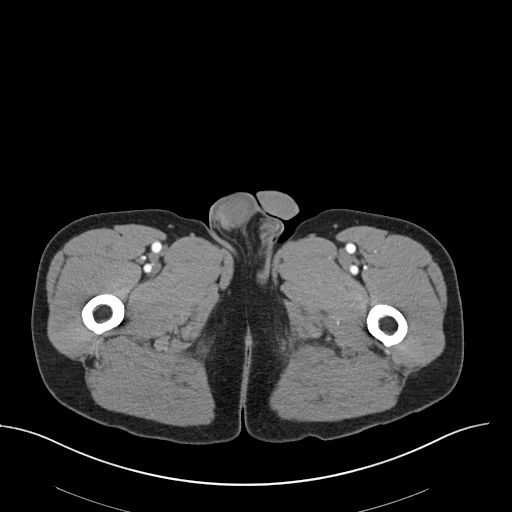
[im 6/100  bone]
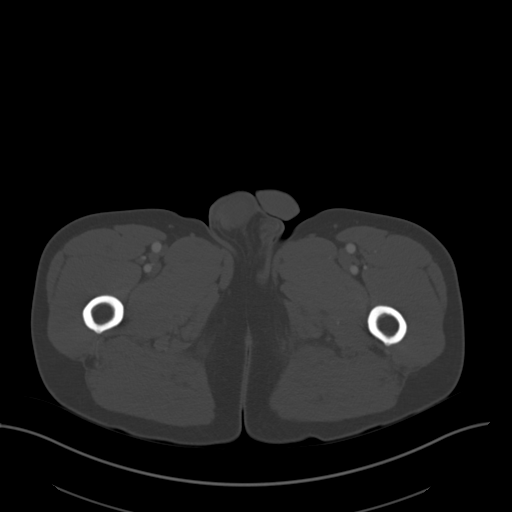
[im 16/100  soft-tissue]
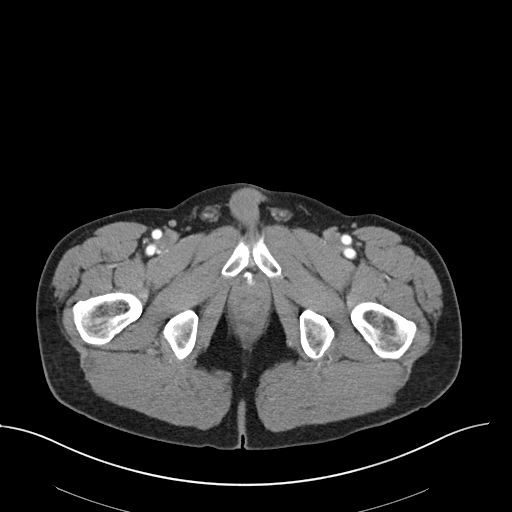
[im 27/100  soft-tissue]
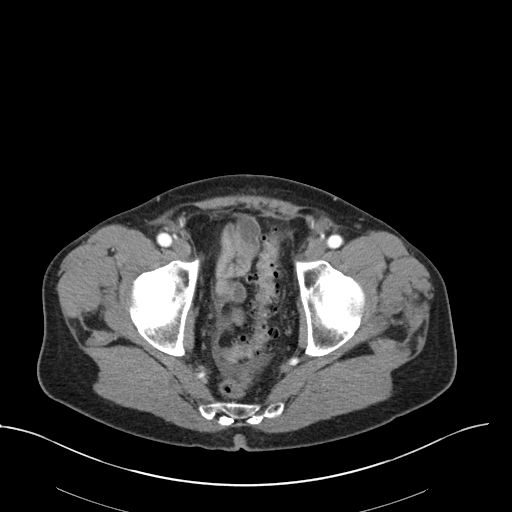
[im 32/100  soft-tissue]
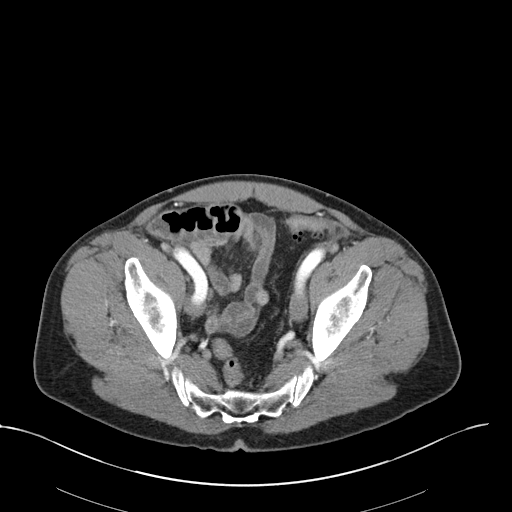
[im 42/100  soft-tissue]
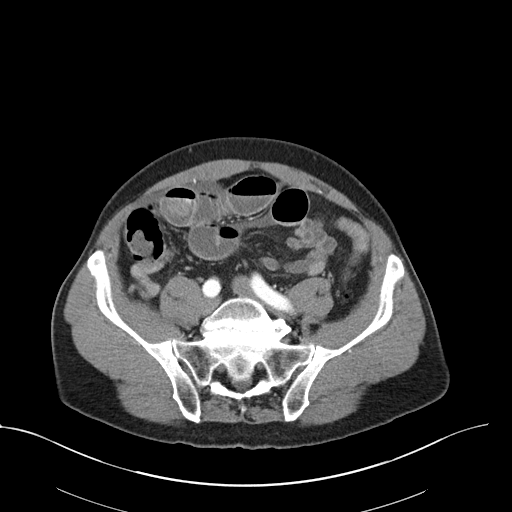
[im 53/100  soft-tissue]
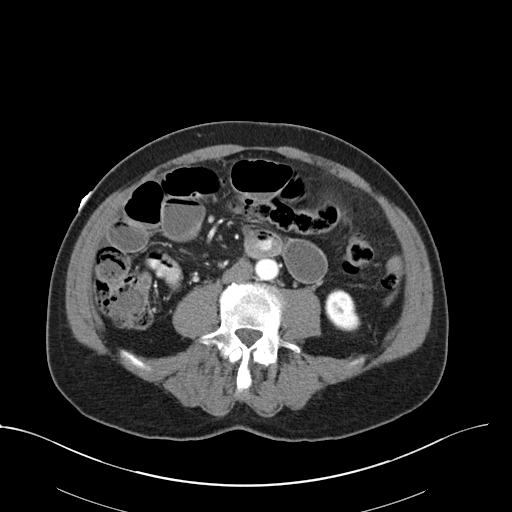
[im 58/100  soft-tissue]
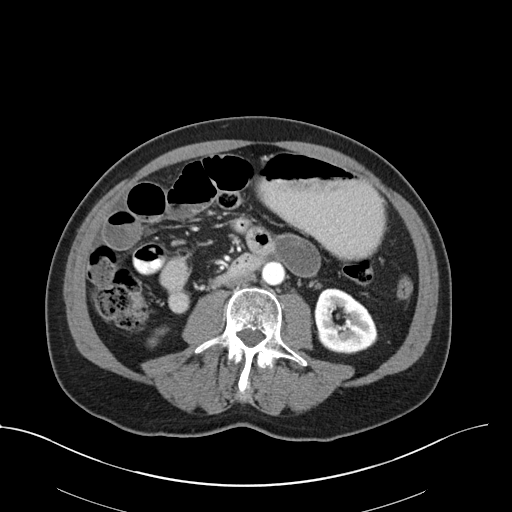
[im 68/100  soft-tissue]
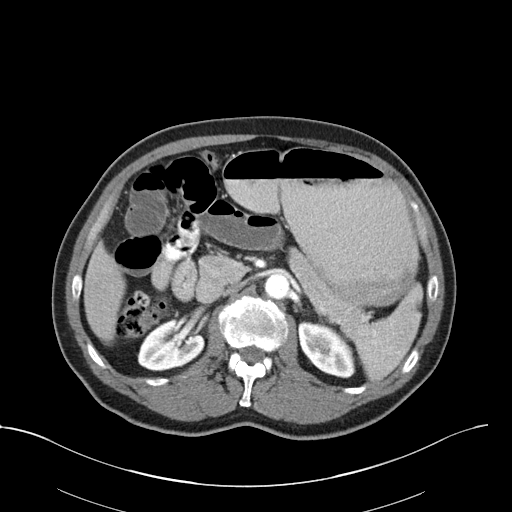
[im 73/100  soft-tissue]
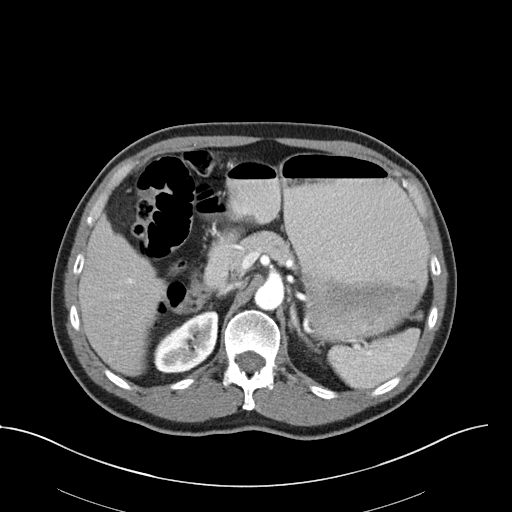
[im 73/100  bone]
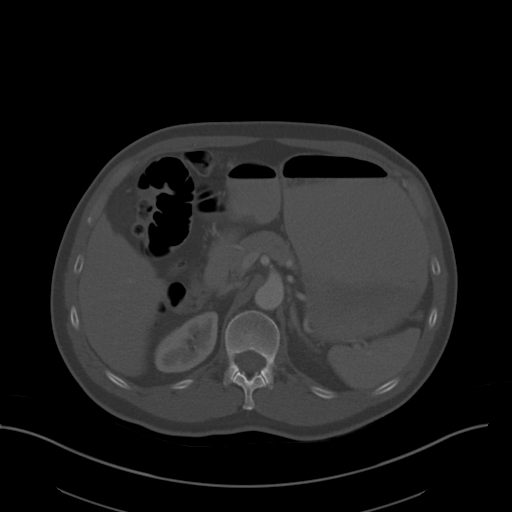
[im 84/100  soft-tissue]
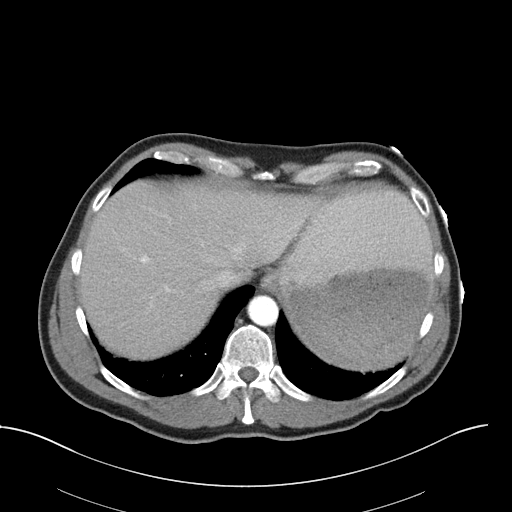
[im 94/100  soft-tissue]
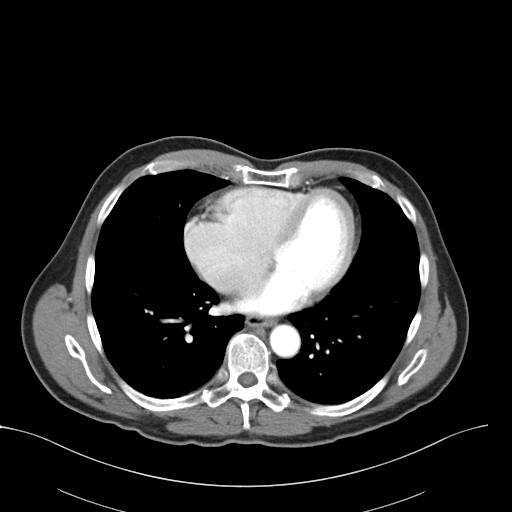

[Series 4: cor · coronal · 0.65mm/px · 3 of 132 slices shown]
[im 44/132  soft-tissue]
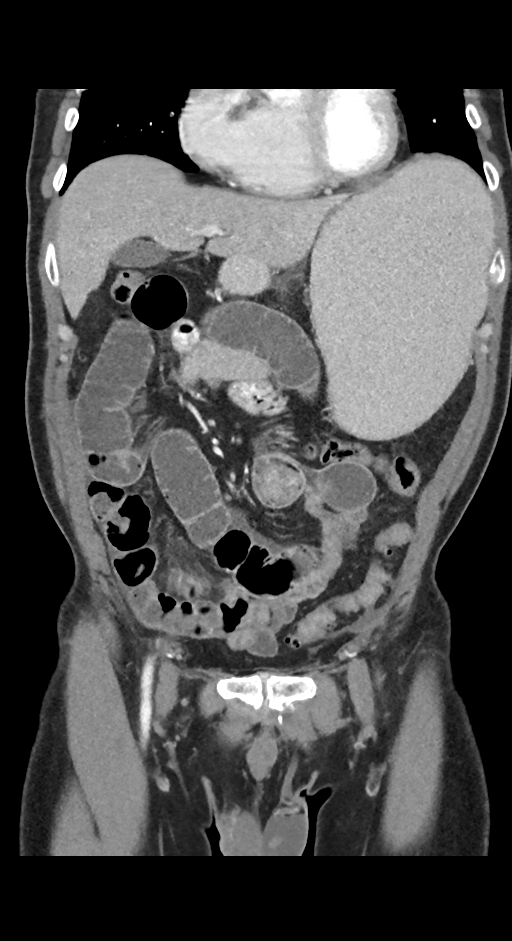
[im 59/132  soft-tissue]
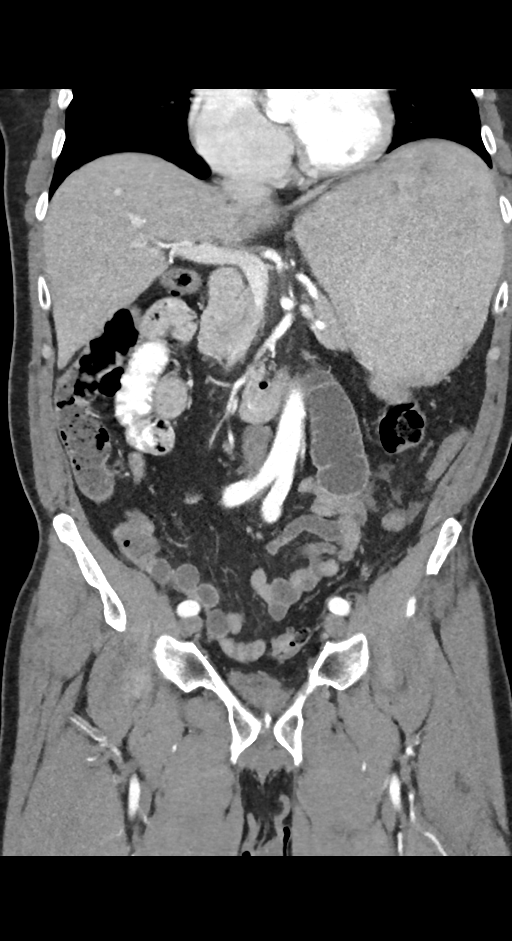
[im 73/132  soft-tissue]
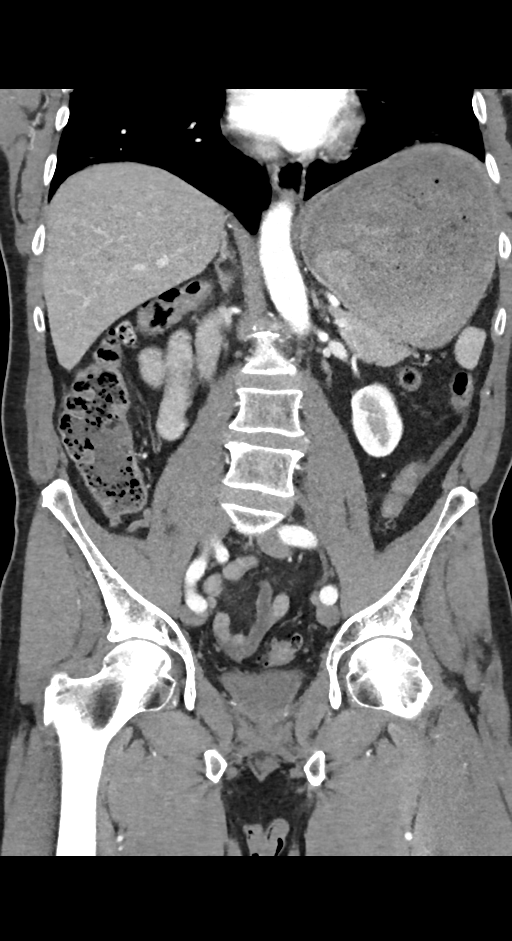

[14 of 46 positions shown; findings below may reference images not displayed]

FINDINGS: The lung bases are grossly clear. Moderate breathing motion
artifact. Streaky subsegmental bibasilar atelectasis is noted.

The liver is unremarkable. No focal lesions or biliary dilatation.
The gallbladder is normal. No common bowel duct dilatation. The
pancreas is normal. The spleen is normal. The adrenal glands and
kidneys are normal.

The stomach is distended with contrast and air. The proximal small
bowel is also distended with scattered air-fluid levels. Findings
consistent with a small bowel obstruction. There is a transition to
normal/decompressed mid distal ileal loops of small bowel. Suspect
adhesions in the left lower abdomen at the level of the iliac crest.
No obstructing mass or inflammatory changes. The colon is
unremarkable. Moderate sigmoid diverticulosis without findings for
acute diverticulitis.

The aorta is normal in caliber. The major branch vessels are normal.
No mesenteric or retroperitoneal mass or adenopathy.

The bladder, prostate gland and seminal vesicles are unremarkable.
There is a small amount of free pelvic fluid likely from a small
bowel obstruction.

The bony structures are intact. Bilateral pars defects noted at L5
with a grade 2 spondylolisthesis.
IMPRESSION: 1. CT findings consistent with a the mid small bowel obstruction
likely due to adhesions in the left mid abdomen. No obstructing
mass.
2. Diverticulosis of the sigmoid colon but no findings for
diverticulitis.
3. The solid abdominal organs are unremarkable.

## 2014-06-01 DIAGNOSIS — L738 Other specified follicular disorders: Secondary | ICD-10-CM | POA: Diagnosis not present

## 2014-06-01 DIAGNOSIS — Z85828 Personal history of other malignant neoplasm of skin: Secondary | ICD-10-CM | POA: Diagnosis not present

## 2014-06-01 DIAGNOSIS — L259 Unspecified contact dermatitis, unspecified cause: Secondary | ICD-10-CM | POA: Diagnosis not present

## 2014-06-01 DIAGNOSIS — D2330 Other benign neoplasm of skin of unspecified part of face: Secondary | ICD-10-CM | POA: Diagnosis not present

## 2014-06-01 DIAGNOSIS — D1801 Hemangioma of skin and subcutaneous tissue: Secondary | ICD-10-CM | POA: Diagnosis not present

## 2014-06-01 DIAGNOSIS — L678 Other hair color and hair shaft abnormalities: Secondary | ICD-10-CM | POA: Diagnosis not present

## 2014-06-03 ENCOUNTER — Other Ambulatory Visit: Payer: Self-pay | Admitting: Family Medicine

## 2014-06-03 DIAGNOSIS — Z79899 Other long term (current) drug therapy: Secondary | ICD-10-CM

## 2014-06-03 DIAGNOSIS — Z0000 Encounter for general adult medical examination without abnormal findings: Secondary | ICD-10-CM

## 2014-06-03 DIAGNOSIS — Z13220 Encounter for screening for lipoid disorders: Secondary | ICD-10-CM

## 2014-06-07 ENCOUNTER — Other Ambulatory Visit (INDEPENDENT_AMBULATORY_CARE_PROVIDER_SITE_OTHER): Payer: Medicare Other

## 2014-06-07 DIAGNOSIS — Z13220 Encounter for screening for lipoid disorders: Secondary | ICD-10-CM | POA: Diagnosis not present

## 2014-06-07 DIAGNOSIS — Z79899 Other long term (current) drug therapy: Secondary | ICD-10-CM | POA: Diagnosis not present

## 2014-06-07 LAB — BASIC METABOLIC PANEL
BUN: 16 mg/dL (ref 6–23)
CALCIUM: 9 mg/dL (ref 8.4–10.5)
CO2: 31 mEq/L (ref 19–32)
Chloride: 104 mEq/L (ref 96–112)
Creatinine, Ser: 1.1 mg/dL (ref 0.4–1.5)
GFR: 71.77 mL/min (ref >60.00)
GLUCOSE: 83 mg/dL (ref 70–99)
Potassium: 3.9 mEq/L (ref 3.5–5.1)
Sodium: 140 mEq/L (ref 135–145)

## 2014-06-07 LAB — LIPID PANEL
CHOL/HDL RATIO: 3
Cholesterol: 190 mg/dL (ref 0–200)
HDL: 58.6 mg/dL (ref >39.00)
LDL CALC: 122 mg/dL — AB (ref 0–99)
NonHDL: 131.4
TRIGLYCERIDES: 46 mg/dL (ref 0.0–149.0)
VLDL: 9.2 mg/dL (ref 0.0–40.0)

## 2014-06-07 LAB — CBC WITH DIFFERENTIAL/PLATELET
BASOS ABS: 0.1 10*3/uL (ref 0.0–0.1)
Basophils Relative: 0.9 % (ref 0.0–3.0)
EOS ABS: 0.2 10*3/uL (ref 0.0–0.7)
Eosinophils Relative: 3.3 % (ref 0.0–5.0)
HCT: 42.1 % (ref 39.0–52.0)
HEMOGLOBIN: 14.1 g/dL (ref 13.0–17.0)
LYMPHS PCT: 38.3 % (ref 12.0–46.0)
Lymphs Abs: 2.2 10*3/uL (ref 0.7–4.0)
MCHC: 33.5 g/dL (ref 30.0–36.0)
MCV: 93.4 fl (ref 78.0–100.0)
Monocytes Absolute: 0.3 10*3/uL (ref 0.1–1.0)
Monocytes Relative: 6.1 % (ref 3.0–12.0)
Neutro Abs: 2.9 10*3/uL (ref 1.4–7.7)
Neutrophils Relative %: 51.4 % (ref 43.0–77.0)
Platelets: 223 10*3/uL (ref 150.0–400.0)
RBC: 4.51 Mil/uL (ref 4.22–5.81)
RDW: 13.9 % (ref 11.5–15.5)
WBC: 5.7 10*3/uL (ref 4.0–10.5)

## 2014-06-07 LAB — HEPATIC FUNCTION PANEL
ALK PHOS: 70 U/L (ref 39–117)
ALT: 17 U/L (ref 0–53)
AST: 23 U/L (ref 0–37)
Albumin: 4 g/dL (ref 3.5–5.2)
Bilirubin, Direct: 0 mg/dL (ref 0.0–0.3)
Total Bilirubin: 0.5 mg/dL (ref 0.2–1.2)
Total Protein: 6.8 g/dL (ref 6.0–8.3)

## 2014-06-09 ENCOUNTER — Other Ambulatory Visit: Payer: Medicare Other

## 2014-06-16 ENCOUNTER — Ambulatory Visit (INDEPENDENT_AMBULATORY_CARE_PROVIDER_SITE_OTHER): Payer: Medicare Other | Admitting: Family Medicine

## 2014-06-16 VITALS — BP 90/56 | HR 55 | Temp 98.3°F | Ht 68.31 in | Wt 167.5 lb

## 2014-06-16 DIAGNOSIS — Z0000 Encounter for general adult medical examination without abnormal findings: Secondary | ICD-10-CM | POA: Diagnosis not present

## 2014-06-16 DIAGNOSIS — Z23 Encounter for immunization: Secondary | ICD-10-CM | POA: Diagnosis not present

## 2014-06-16 NOTE — Progress Notes (Signed)
Pre visit review using our clinic review tool, if applicable. No additional management support is needed unless otherwise documented below in the visit note. 

## 2014-06-16 NOTE — Progress Notes (Signed)
Dr. Frederico Hamman T. Arella Blinder, MD, [facility] Primary [facility] and Sports Medicine [place], [address] Phone: [phone] Fax: [phone]  06/16/2014  Patient: Derek [name], MRN: [id number], DOB: Jul 31, 1946, 68 y.o.  Primary Physician:  [name], MD  Chief Complaint: Annual Exam  Subjective:   [name] is a 68 y.o. pleasant patient who presents for a medicare wellness examination:  Preventative Health Maintenance Visit:  Health Maintenance Summary Reviewed and updated, unless pt declines services.  Tobacco History Reviewed. Alcohol: No concerns, no excessive use Exercise Habits: Still running and lifting weights most days STD concerns: no risk or activity to increase risk Drug Use: None Encouraged self-testicular check  Health Maintenance  Topic Date Due  . Tetanus/tdap  02/02/1963  . Influenza Vaccine  04/25/2015  . Colonoscopy  12/17/2022  . Pneumococcal Polysaccharide Vaccine Age 59 And Over  Completed  . Zostavax  Completed    Immunization History  Administered Date(s) Administered  . Influenza,inj,Quad PF,36+ Mos 06/25/2013, 06/16/2014  . Pneumococcal Polysaccharide-23 08/14/2006, 06/25/2013  . Zoster 08/14/2006    Patient Active Problem List   Diagnosis Date Noted  . Left rotator cuff tear 07/10/2013  . Scrotal discoloration / purpling ?varicocele 09/08/2012  . Bilateral inguinal hernia (BIH) s/p lap repair 07/06/2012 06/24/2012  . OTHER MALAISE AND FATIGUE 04/27/2009  . PROSTATE CANCER, HX OF 11/10/2008  . CARCINOMA, BASAL CELL, HX OF 11/10/2008  . RHEUMATIC FEVER, HX OF 11/10/2008  . DIVERTICULITIS, HX OF 11/10/2008   Past Medical History  Diagnosis Date  . Cancer of prostate     pT2c No Mx, Gleason 3+4=7  . Diverticulitis   . Kidney stones   . Rheumatic fever 1952-1953  . Basal cell carcinoma 2009    back  . Hypogonadism male   . Nephrolithiasis   . Left rotator cuff tear 07/10/2013   Past Surgical History   Procedure Laterality Date  . Rotator cuff surgery  2002    Gioffre-rt  . Knee surgery  2005    Duda- arthroscopy, partial medial menisectomy-lt  . Prostate surgery  12/19/06    robotic prostatectomy  . Bilateral inguinal hernia repair  07/10/2012    lap BIH repairs  . Hernia repair  07/10/12    LIH/rih-umb  . Colonoscopy    . Shoulder arthroscopy with rotator cuff repair and subacromial decompression Left 07/10/2013    Procedure: LEFT SHOULDER ARTHROSCOPY WITH ARTHROSCOPIC ROTATOR CUFF REPAIR AND SUBACROMIAL DECOMPRESSION, PARTIAL ACROMIOPLASTY WITH CORACROMIAL RELEASE;  Surgeon: Johnny Bridge, MD;  Location: [facility];  Service: Orthopedics;  Laterality: Left;   History   Social History  . Marital Status: Married    Spouse Name: N/A    Number of Children: N/A  . Years of Education: N/A   Occupational History  . retired Licensed conveyancer) Health visitor   Social History Main Topics  . Smoking status: Former Smoker    Quit date: 09/25/1971  . Smokeless tobacco: Never Used  . Alcohol Use: No  . Drug Use: No  . Sexual Activity: Not on file   Other Topics Concern  . Not on file   Social History Narrative   Regular exercise: yes   Runner, former marathon runner   Family History  Problem Relation Age of Onset  . Breast cancer Mother   . Cancer Brother 21    brain tumor  . Heart disease      fam hx  . Arthritis Other  other relative  . Prostate cancer Other     nephew  . Lung cancer Other 21    nephew   No Known Allergies  Medication list has been reviewed and updated.   General: Denies fever, chills, sweats. No significant weight loss. Eyes: Denies blurring,significant itching ENT: Denies earache, sore throat, and hoarseness. Cardiovascular: Denies chest pains, palpitations, dyspnea on exertion Respiratory: Denies cough, dyspnea at rest,wheeezing Breast: no concerns about lumps GI: Denies nausea, vomiting, diarrhea, constipation,  change in bowel habits, abdominal pain, melena, hematochezia GU: Denies penile discharge, ED, urinary flow / outflow problems. No STD concerns. Musculoskeletal: Denies back pain, joint pain Derm: Denies rash, itching Neuro: Denies  paresthesias, frequent falls, frequent headaches Psych: Denies depression, anxiety Endocrine: Denies cold intolerance, heat intolerance, polydipsia Heme: Denies enlarged lymph nodes Allergy: No hayfever  Objective:   BP 90/56  Pulse 55  Temp(Src) 98.3 F (36.8 C) (Oral)  Ht 5' 8.31" (1.735 m)  Wt 167 lb 8 oz (75.978 kg)  BMI 25.24 kg/m2  The patient completed a fall screen and PHQ-2 and PHQ-9 if necessary, which is documented in the EHR. The CMA/LPN/RN who assisted the patient verbally completed with them and documented results in [facility].   Hearing Screening   Method: Audiometry   '125Hz'  '250Hz'  '500Hz'  '1000Hz'  '2000Hz'  '4000Hz'  '8000Hz'   Right ear:   '20 20 20 20   ' Left ear:   '20 20 20 20   ' Vision Screening Comments: Has yearly eye exam scheduled with Dr. Gershon Crane 06/28/2014  GEN: well developed, well nourished, no acute distress Eyes: conjunctiva and lids normal, PERRLA, EOMI ENT: TM clear, nares clear, oral exam WNL Neck: supple, no lymphadenopathy, no thyromegaly, no JVD Pulm: clear to auscultation and percussion, respiratory effort normal CV: regular rate and rhythm, S1-S2, no murmur, rub or gallop, no bruits, peripheral pulses normal and symmetric, no cyanosis, clubbing, edema or varicosities GI: soft, non-tender; no hepatosplenomegaly, masses; active bowel sounds all quadrants GU: no hernia, testicular mass, penile discharge Lymph: no cervical, axillary or inguinal adenopathy MSK: gait normal, muscle tone and strength WNL, no joint swelling, effusions, discoloration, crepitus  SKIN: clear, good turgor, color WNL, no rashes, lesions, or ulcerations Neuro: normal mental status, normal strength, sensation, and motion Psych: alert; oriented to  person, place and time, normally interactive and not anxious or depressed in appearance.  All labs reviewed with patient.  Lipids:    Component Value Date/Time   CHOL 190 06/07/2014 0811   TRIG 46.0 06/07/2014 0811   HDL 58.60 06/07/2014 0811   LDLDIRECT 112.7 07/02/2011 1104   VLDL 9.2 06/07/2014 0811   CHOLHDL 3 06/07/2014 0811   CBC: CBC Latest Ref Rng 06/07/2014 07/10/2013 06/26/2013  WBC 4.0 - 10.5 K/uL 5.7 - 9.8  Hemoglobin 13.0 - 17.0 g/dL 14.1 13.6 13.5  Hematocrit 39.0 - 52.0 % 42.1 - 37.7(L)  Platelets 150.0 - 400.0 K/uL 223.0 - 203    Basic Metabolic Panel:    Component Value Date/Time   NA 140 06/07/2014 0811   K 3.9 06/07/2014 0811   CL 104 06/07/2014 0811   CO2 31 06/07/2014 0811   BUN 16 06/07/2014 0811   CREATININE 1.1 06/07/2014 0811   GLUCOSE 83 06/07/2014 0811   CALCIUM 9.0 06/07/2014 0811   Hepatic Function Latest Ref Rng 06/07/2014 06/25/2013 06/01/2013  Total Protein 6.0 - 8.3 g/dL 6.8 8.0 6.5  Albumin 3.5 - 5.2 g/dL 4.0 4.7 4.0  AST 0 - 37 U/L 23 30 24  ALT 0 - 53 U/L '17 21 17  ' Alk Phosphatase 39 - 117 U/L 70 92 61  Total Bilirubin 0.2 - 1.2 mg/dL 0.5 0.8 0.8  Bilirubin, Direct 0.0 - 0.3 mg/dL 0.0 - 0.0    Lab Results  Component Value Date   TSH 2.99 06/05/2010   Lab Results  Component Value Date   PSA 0.02* 04/08/2013   PSA 0.02* 07/02/2011   PSA 0.02* 06/05/2010    Assessment and Plan:   Routine general medical examination at a health care facility  Need for prophylactic vaccination and inoculation against influenza - Plan: Flu Vaccine QUAD 36+ mos IM  Health Maintenance Exam: The patient's preventative maintenance and recommended screening tests for an annual wellness exam were reviewed in full today. Brought up to date unless services declined.  Counselled on the importance of diet, exercise, and its role in overall health and mortality. The patient's FH and SH was reviewed, including their home life, tobacco status, and drug and alcohol  status.  I have personally reviewed the Medicare Annual Wellness questionnaire and have noted 1. The patient's medical and social history 2. Their use of alcohol, tobacco or illicit drugs 3. Their current medications and supplements 4. The patient's functional ability including ADL's, fall risks, home safety risks and hearing or visual             impairment. 5. Diet and physical activities 6. Evidence for depression or mood disorders  The patients weight, height, BMI and visual acuity have been recorded in the chart I have made referrals, counseling and provided education to the patient based review of the above and I have provided the pt with a written personalized care plan for preventive services.  I have provided the patient with a copy of your personalized plan for preventive services. Instructed to take the time to review along with their updated medication list.  He is really doing well. Keep up exercise.  Follow-up: No Follow-up on file. Or follow-up in 1 year for complete physical examination  New Prescriptions   No medications on file   Orders Placed This Encounter  Procedures  . Flu Vaccine QUAD 36+ mos IM    Signed,  Latham Kinzler T. Russel Morain, MD

## 2014-06-17 ENCOUNTER — Encounter: Payer: Self-pay | Admitting: Family Medicine

## 2014-07-19 DIAGNOSIS — Z961 Presence of intraocular lens: Secondary | ICD-10-CM | POA: Diagnosis not present

## 2014-07-19 DIAGNOSIS — H04123 Dry eye syndrome of bilateral lacrimal glands: Secondary | ICD-10-CM | POA: Diagnosis not present

## 2014-10-08 ENCOUNTER — Telehealth: Payer: Self-pay | Admitting: Family Medicine

## 2014-10-08 NOTE — Telephone Encounter (Signed)
[facility] Patient Name: Derek [name] DOB: June 16, 1946 Nurse Assessment Nurse: Venetia Maxon, RN, Manuela Schwartz Date/Time (Eastern Time): 10/08/2014 2:48:05 PM Confirm and document reason for call. If symptomatic, describe symptoms. ---Caller states he has head and chest congestion, wheezy and rattling. Symptoms began a wk and 1/2 ago. Had this last year . Had PE Nov no meds. He has not checked his temp. No chills. he has a dry cough. He has a Proair left from last year He is SOB. Has the patient traveled out of the country within the last 30 days? ---No Does the patient require triage? ---Yes Related visit to physician within the last 2 weeks? ---No Does the PT have any chronic conditions? (i.e. diabetes, asthma, etc.) ---No Guidelines Guideline Title Affirmed Question Affirmed Notes Breathing Difficulty Wheezing can be heard across the room Final Disposition User Go to ED Now Venetia Maxon, RN, Manuela Schwartz

## 2014-10-08 NOTE — Telephone Encounter (Signed)
Pt as appt with Dr Lorelei Pont on 10/11/14 at 12:15pm.

## 2014-10-08 NOTE — Telephone Encounter (Signed)
Agree with recommendation to go to  Urgent care/ER, pt refused per [facility]. Pt understands recommendations and risks per [facility]

## 2014-10-11 ENCOUNTER — Encounter: Payer: Self-pay | Admitting: Family Medicine

## 2014-10-11 ENCOUNTER — Ambulatory Visit (INDEPENDENT_AMBULATORY_CARE_PROVIDER_SITE_OTHER): Payer: Medicare Other | Admitting: Family Medicine

## 2014-10-11 ENCOUNTER — Ambulatory Visit: Payer: Medicare Other | Admitting: Family Medicine

## 2014-10-11 VITALS — BP 90/60 | HR 54 | Temp 98.6°F | Ht 68.31 in | Wt 172.8 lb

## 2014-10-11 DIAGNOSIS — J0100 Acute maxillary sinusitis, unspecified: Secondary | ICD-10-CM

## 2014-10-11 DIAGNOSIS — R062 Wheezing: Secondary | ICD-10-CM

## 2014-10-11 MED ORDER — AMOXICILLIN-POT CLAVULANATE 875-125 MG PO TABS: 1.0000 | ORAL_TABLET | Freq: Two times a day (BID) | ORAL | Status: DC

## 2014-10-11 MED ORDER — ALBUTEROL SULFATE HFA 108 (90 BASE) MCG/ACT IN AERS: 2.0000 | INHALATION_SPRAY | Freq: Four times a day (QID) | RESPIRATORY_TRACT | Status: DC | PRN

## 2014-10-11 NOTE — Progress Notes (Signed)
   Dr. Frederico Hamman T. Aryeh Butterfield, MD, [facility] Primary Care and Sports Medicine [place], [address] Phone: [phone] Fax: [phone]  10/11/2014  Patient: Derek [name], MRN: [id number], DOB: 1945-10-14, 69 y.o.  Primary Physician:  [name], MD  Chief Complaint: Chest Congestion and Sinus Pressure  Subjective:   Derek [name] is a 69 y.o. very pleasant male patient who presents with the following:  Cold right before christmas, did some OTC meds. Now having more difficulty with his chest and coughing fairly bad. Was taking Amox 500 mg BID, and using some ear drops. ? Not crackling as much in his ears. Not coughing.   He actually had some oral amoxicillin as well as some antibiotic eardrops at home, so he has been using this, but only taking 500 mg of amoxicillin twice a day.  Over the last few days since he has been doing this, his sinus pain has been improving.  He has not had a fever.  Smoked rarely from 1968-79?, pack a week  Past Medical History, Surgical History, Social History, Family History, Problem List, Medications, and Allergies have been reviewed and updated if relevant.  ROS: GEN: Acute illness details above GI: Tolerating PO intake GU: maintaining adequate hydration and urination Pulm: No SOB Interactive and getting along well at home.  Otherwise, ROS is as per the HPI.   Objective:   BP 90/60 mmHg  Pulse 54  Temp(Src) 98.6 F (37 C) (Oral)  Ht 5' 8.31" (1.735 m)  Wt 172 lb 12 oz (78.359 kg)  BMI 26.03 kg/m2   Gen: WDWN, NAD; A & O x3, cooperative. Pleasant.Globally Non-toxic HEENT: Normocephalic and atraumatic. Throat clear, w/o exudate, R TM clear, L TM - good landmarks, No fluid present. Minimal rhinnorhea.  MMM Frontal sinuses: NT Max sinuses: NT NECK: Anterior cervical  LAD is absent CV: RRR, No M/G/R, cap refill <2 sec PULM: Breathing comfortably in no respiratory distress. rare wheezing, no crackles,  rhonchi EXT: No c/c/e PSYCH: Friendly, good eye contact MSK: Nml gait     Laboratory and Imaging Data:  Assessment and Plan:   Acute maxillary sinusitis, recurrence not specified  Wheezing   Three-week history, likely partially treated this with his own low-dose amoxicillin.  Change to Augmentin.  He also is having some mild wheezing, so I gave him some albuterol to use when necessary.  Follow-up: No Follow-up on file.  New Prescriptions   ALBUTEROL (PROVENTIL HFA;VENTOLIN HFA) 108 (90 BASE) MCG/ACT INHALER    Inhale 2 puffs into the lungs every 6 (six) hours as needed for wheezing or shortness of breath.   AMOXICILLIN-CLAVULANATE (AUGMENTIN) 875-125 MG PER TABLET    Take 1 tablet by mouth 2 (two) times daily.   No orders of the defined types were placed in this encounter.    Signed,  Maud Deed. Bronte Sabado, MD   Patient's Medications  New Prescriptions   ALBUTEROL (PROVENTIL HFA;VENTOLIN HFA) 108 (90 BASE) MCG/ACT INHALER    Inhale 2 puffs into the lungs every 6 (six) hours as needed for wheezing or shortness of breath.   AMOXICILLIN-CLAVULANATE (AUGMENTIN) 875-125 MG PER TABLET    Take 1 tablet by mouth 2 (two) times daily.  Previous Medications   No medications on file  Modified Medications   No medications on file  Discontinued Medications   AMOXICILLIN (AMOXIL) 500 MG CAPSULE

## 2014-10-11 NOTE — Progress Notes (Signed)
Pre visit review using our clinic review tool, if applicable. No additional management support is needed unless otherwise documented below in the visit note. 

## 2014-10-18 ENCOUNTER — Encounter: Payer: Self-pay | Admitting: Family Medicine

## 2014-10-18 ENCOUNTER — Ambulatory Visit (INDEPENDENT_AMBULATORY_CARE_PROVIDER_SITE_OTHER): Payer: Medicare Other | Admitting: Family Medicine

## 2014-10-18 VITALS — BP 90/70 | HR 84 | Temp 98.6°F | Ht 68.31 in | Wt 176.0 lb

## 2014-10-18 DIAGNOSIS — J209 Acute bronchitis, unspecified: Secondary | ICD-10-CM

## 2014-10-18 DIAGNOSIS — J0100 Acute maxillary sinusitis, unspecified: Secondary | ICD-10-CM | POA: Diagnosis not present

## 2014-10-18 DIAGNOSIS — R062 Wheezing: Secondary | ICD-10-CM | POA: Diagnosis not present

## 2014-10-18 MED ORDER — PREDNISONE 20 MG PO TABS: ORAL_TABLET | ORAL | Status: DC

## 2014-10-18 MED ORDER — LEVOFLOXACIN 500 MG PO TABS: 500.0000 mg | ORAL_TABLET | Freq: Every day | ORAL | Status: DC

## 2014-10-18 NOTE — Progress Notes (Signed)
   Dr. Frederico Hamman T. Savan Ruta, MD, [facility] Primary Care and Sports Medicine [place], [address] Phone: [phone] Fax: [phone]  10/18/2014  Patient: Derek [name], MRN: [id number], DOB: 1946/01/08, 69 y.o.  Primary Physician:  [name], MD  Chief Complaint: Follow-up  Subjective:   ROMMEL [name] is a 69 y.o. very pleasant male patient who presents with the following:  Prior history per below. He has had some ongoing symptoms, that initially began as a cold around Christmas time, and this has persisted now for a month. He is now having some worsening shortness of breath over the last week, and he has been actively wheezing. He has been using some albuterol that I gave him a for, and he also has been on Augmentin. He does not have any known history of pulmonary disease.  Tomorrow is the last dose of Augmentin. Yesterday, felt really short of breath. Has not been able to progress too much and he is coughing still here and up in his chest.   10/11/2014 Last OV with [name], MD  Cold right before christmas, did some OTC meds. Now having more difficulty with his chest and coughing fairly bad. Was taking Amox 500 mg BID, and using some ear drops. ? Not crackling as much in his ears. Not coughing.   He actually had some oral amoxicillin as well as some antibiotic eardrops at home, so he has been using this, but only taking 500 mg of amoxicillin twice a day.  Over the last few days since he has been doing this, his sinus pain has been improving.  He has not had a fever.  Smoked rarely from 1968-79?, pack a week  Past Medical History, Surgical History, Social History, Family History, Problem List, Medications, and Allergies have been reviewed and updated if relevant.  ROS: GEN: Acute illness details above GI: Tolerating PO intake GU: maintaining adequate hydration and urination Pulm: No SOB Interactive and getting along well at  home.  Otherwise, ROS is as per the HPI.   Objective:   BP 90/70 mmHg  Pulse 84  Temp(Src) 98.6 F (37 C) (Oral)  Ht 5' 8.31" (1.735 m)  Wt 176 lb (79.833 kg)  BMI 26.52 kg/m2  SpO2 97%   Gen: WDWN, NAD; A & O x3, cooperative. Pleasant.Globally Non-toxic HEENT: Normocephalic and atraumatic. Throat clear, w/o exudate, R TM clear, L TM - good landmarks, No fluid present. Minimal rhinnorhea.  MMM Frontal sinuses: NT Max sinuses: NT NECK: Anterior cervical  LAD is absent CV: RRR, No M/G/R, cap refill <2 sec PULM: Breathing comfortably in no respiratory distress. Diffuse b wheezing, no crackles, rhonchi EXT: No c/c/e PSYCH: Friendly, good eye contact MSK: Nml gait     Laboratory and Imaging Data:  Assessment and Plan:   Acute bronchitis, unspecified organism  Acute maxillary sinusitis, recurrence not specified  Wheezing    The patient's pulmonary exam is quite a bit worse compared to before. He is actively wheezing and both his lung fields. I am to place him on prednisone, and placed him on Levaquin. Continue with albuterol as needed.  Signed,  Maud Deed. [place], MD  New Prescriptions   LEVOFLOXACIN (LEVAQUIN) 500 MG TABLET    Take 1 tablet (500 mg total) by mouth daily.   PREDNISONE (DELTASONE) 20 MG TABLET    2 tabs po for 5 days, then 1 tab po for 2 days

## 2014-10-18 NOTE — Progress Notes (Signed)
Pre visit review using our clinic review tool, if applicable. No additional management support is needed unless otherwise documented below in the visit note. 

## 2014-10-22 ENCOUNTER — Other Ambulatory Visit: Payer: Self-pay | Admitting: Family Medicine

## 2014-10-22 ENCOUNTER — Telehealth: Payer: Self-pay | Admitting: Family Medicine

## 2014-10-22 NOTE — Telephone Encounter (Signed)
Derek [name] called stating he last dose  of predizone.  Will be on Sunday.  He is improving but still has congestion and wanted to know if you thought he might need another round  of predizone.  [facility]

## 2014-10-22 NOTE — Telephone Encounter (Signed)
Last office visit 10/18/2014.

## 2014-10-22 NOTE — Telephone Encounter (Signed)
Can await Dr. Lorelei Pont eval on Monday.

## 2014-10-24 NOTE — Telephone Encounter (Signed)
Call: if he is still wheezing, then I think that is fine.   Prednisone 10 mg. 3 tabs po for 3 days, 2 tabs for 3 days, then 1 tab for 2 days #17, 0 ref

## 2014-10-25 MED ORDER — PREDNISONE 10 MG PO TABS: ORAL_TABLET | ORAL | Status: DC

## 2014-10-25 NOTE — Telephone Encounter (Signed)
Spoke with Mr. Derek [name].  He cough still sounding bad.  Prescription for prednisone sent to [facility].

## 2014-11-01 ENCOUNTER — Other Ambulatory Visit: Payer: Self-pay | Admitting: Family Medicine

## 2014-11-01 DIAGNOSIS — R062 Wheezing: Secondary | ICD-10-CM

## 2014-11-01 DIAGNOSIS — R0989 Other specified symptoms and signs involving the circulatory and respiratory systems: Secondary | ICD-10-CM

## 2014-11-01 NOTE — Telephone Encounter (Signed)
Done

## 2014-11-01 NOTE — Telephone Encounter (Signed)
Pt left vm requesting referral to pulmonologist ASAP; pt has taken 2 rx of prednisone and abx. And has seen Dr Lorelei Pont x 2; pt has had congestion in chest, still has severe cough and pt is concerned and thinks needs to see specialist. Pt request cb.

## 2014-11-02 ENCOUNTER — Ambulatory Visit (INDEPENDENT_AMBULATORY_CARE_PROVIDER_SITE_OTHER): Payer: Medicare Other | Admitting: Emergency Medicine

## 2014-11-02 ENCOUNTER — Ambulatory Visit (INDEPENDENT_AMBULATORY_CARE_PROVIDER_SITE_OTHER)
Admission: RE | Admit: 2014-11-02 | Discharge: 2014-11-02 | Disposition: A | Discharge status: Home / Self Care | Payer: Medicare Other | Source: Ambulatory Visit | Attending: Emergency Medicine | Admitting: Emergency Medicine

## 2014-11-02 ENCOUNTER — Encounter: Payer: Self-pay | Admitting: Emergency Medicine

## 2014-11-02 VITALS — BP 90/68 | HR 61 | Ht 71.0 in | Wt 172.0 lb

## 2014-11-02 DIAGNOSIS — R0689 Other abnormalities of breathing: Secondary | ICD-10-CM | POA: Diagnosis not present

## 2014-11-02 DIAGNOSIS — R0602 Shortness of breath: Secondary | ICD-10-CM | POA: Diagnosis not present

## 2014-11-02 DIAGNOSIS — R0989 Other specified symptoms and signs involving the circulatory and respiratory systems: Secondary | ICD-10-CM

## 2014-11-02 MED ORDER — OMEPRAZOLE 20 MG PO CPDR: 20.0000 mg | DELAYED_RELEASE_CAPSULE | Freq: Two times a day (BID) | ORAL | Status: DC

## 2014-11-02 NOTE — Patient Instructions (Signed)
Please start omeprazole 20 mg twice a day. Take 1 hour before or after eating. We will perform a chest x-ray today We will perform spirometry at your next office visit Please follow with Dr. Lamonte Sakai in 1 month or next available opening

## 2014-11-02 NOTE — Assessment & Plan Note (Signed)
He describes a sensation in his chest that feels like congestion and which prompted him to cough to try and clear mucus. Suspect  that he is having residual upper airway irritation following his URI. He does not have any nasal drainage. He does occasionally have GERD. I would like to try and treat his GERD empirically to see if the sensation will resolve. We will also perform a chest x-ray and spirometry when he returns to the office

## 2014-11-02 NOTE — Progress Notes (Signed)
Subjective:    Patient ID: Derek [name], male    DOB: Dec 15, 1945, 69 y.o.   MRN: [id number]  HPI 69 yo man, former smoker (about 2-3 pk-yrs), hx of prostate CA, little other PMH. He had been well until end of 12/15. He had a URI at that time that was characterized by congestion, a feeling of congestion in his chest. He forced himself to cough but this was minimally productive. He was treated with abx and prednisone. He continued to have this sensation of mucous in his chest, raspy voice. He can force himself to cough and produce clear thick mucous. He has noticed that his breathing is not at baseline, he hasn';t done his usual exercise routine since that time also. He has had an experience like this before - happened to him 08/2013. Has not had a CXR or any other testing.     Review of Systems  Constitutional: Negative for fever and unexpected weight change.  HENT: Negative for congestion, dental problem, ear pain, nosebleeds, postnasal drip, rhinorrhea, sinus pressure, sneezing, sore throat and trouble swallowing.   Eyes: Negative for redness and itching.  Respiratory: Positive for cough and shortness of breath. Negative for chest tightness and wheezing.   Cardiovascular: Negative for palpitations and leg swelling.  Gastrointestinal: Negative for nausea and vomiting.  Genitourinary: Negative for dysuria.  Musculoskeletal: Negative for joint swelling.  Skin: Negative for rash.  Neurological: Negative for headaches.  Hematological: Does not bruise/bleed easily.  Psychiatric/Behavioral: Negative for dysphoric mood. The patient is not nervous/anxious.     Past Medical History  Diagnosis Date  . Cancer of prostate     pT2c No Mx, Gleason 3+4=7  . Diverticulitis   . Kidney stones   . Rheumatic fever 1952-1953  . Basal cell carcinoma 2009    back  . Hypogonadism male   . Nephrolithiasis   . Left rotator cuff tear 07/10/2013     Family History  Problem Relation Age of Onset  .  Breast cancer Mother   . Cancer Brother 27    brain tumor  . Heart disease      fam hx  . Arthritis Other     other relative  . Prostate cancer Other     nephew  . Lung cancer Other 22    nephew     History   Social History  . Marital Status: Married    Spouse Name: N/A    Number of Children: N/A  . Years of Education: N/A   Occupational History  . retired Licensed conveyancer) Health visitor   Social History Main Topics  . Smoking status: Former Smoker    Quit date: 09/25/1971  . Smokeless tobacco: Never Used  . Alcohol Use: No  . Drug Use: No  . Sexual Activity: Not on file   Other Topics Concern  . Not on file   Social History Narrative   Regular exercise: yes   Runner, former marathon runner    No significant exposures.  [place] [employer] No pets, no mold exposure.   No Known Allergies   Outpatient Prescriptions Prior to Visit  Medication Sig Dispense Refill  . albuterol (PROVENTIL HFA;VENTOLIN HFA) 108 (90 BASE) MCG/ACT inhaler Inhale 2 puffs into the lungs every 6 (six) hours as needed for wheezing or shortness of breath. 1 Inhaler 1  . amoxicillin-clavulanate (AUGMENTIN) 875-125 MG per tablet Take 1 tablet by mouth 2 (two) times daily. 20 tablet 0  . levofloxacin (  LEVAQUIN) 500 MG tablet Take 1 tablet (500 mg total) by mouth daily. 10 tablet 0  . predniSONE (DELTASONE) 10 MG tablet Take 3 tabs po for 3 days, then 2 tabs for 3 days, then 1 tab for 2 days 17 tablet 0   No facility-administered medications prior to visit.         Objective:   Physical Exam Filed Vitals:   11/02/14 1424 11/02/14 1425  BP:  90/68  Pulse:  61  Height: 5\' 11"  (1.803 m)   Weight: 172 lb (78.019 kg)   SpO2:  98%   Gen: Pleasant, well-nourished, in no distress,  normal affect  ENT: No lesions,  mouth clear,  oropharynx clear, no postnasal drip  Neck: No JVD, no TMG, no carotid bruits, he has expiratory stridor on forced expiration  Lungs: No use of  accessory muscles, clear without rales or rhonchi  Cardiovascular: RRR, heart sounds normal, no murmur or gallops, no peripheral edema  Musculoskeletal: No deformities, no cyanosis or clubbing  Neuro: alert, non focal  Skin: Warm, no lesions or rashes      Assessment & Plan:  Chest congestion He describes a sensation in his chest that feels like congestion and which prompted him to cough to try and clear mucus. Suspect  that he is having residual upper airway irritation following his URI. He does not have any nasal drainage. He does occasionally have GERD. I would like to try and treat his GERD empirically to see if the sensation will resolve. We will also perform a chest x-ray and spirometry when he returns to the office

## 2014-11-04 ENCOUNTER — Telehealth: Payer: Self-pay | Admitting: Emergency Medicine

## 2014-11-04 NOTE — Telephone Encounter (Signed)
Pt had CXR 11/03/14. Pt requesting results. Please advise thanks

## 2014-11-05 NOTE — Telephone Encounter (Signed)
Calling back to check on status of results. Please cb on cell # provided

## 2014-11-05 NOTE — Telephone Encounter (Signed)
Called and spoke with pt. Informed pt that once RB responds to message we will call him. Pt verbalized understanding.

## 2014-11-08 NOTE — Telephone Encounter (Signed)
Please let him know that his CXR is normal

## 2014-11-08 NOTE — Telephone Encounter (Signed)
Spoke with pt. Discussed results per RB.  He verbalized understanding and voiced no further questions or concerns a this time.

## 2014-11-26 ENCOUNTER — Ambulatory Visit (INDEPENDENT_AMBULATORY_CARE_PROVIDER_SITE_OTHER): Payer: Medicare Other | Admitting: Emergency Medicine

## 2014-11-26 ENCOUNTER — Encounter: Payer: Self-pay | Admitting: Emergency Medicine

## 2014-11-26 VITALS — BP 122/70 | HR 58 | Temp 97.0°F | Ht 71.0 in | Wt 174.0 lb

## 2014-11-26 DIAGNOSIS — R0989 Other specified symptoms and signs involving the circulatory and respiratory systems: Secondary | ICD-10-CM

## 2014-11-26 DIAGNOSIS — R0600 Dyspnea, unspecified: Secondary | ICD-10-CM | POA: Diagnosis not present

## 2014-11-26 MED ORDER — OMEPRAZOLE 20 MG PO CPDR: 20.0000 mg | DELAYED_RELEASE_CAPSULE | Freq: Every day | ORAL | Status: DC

## 2014-11-26 NOTE — Patient Instructions (Signed)
Please restart omeprazole 20mg  daily an hour before or after food Start chlorpheniramine 4mg  up to every 6 hours if needed for congestion or coughing Set a time to practice voice rest Try to avoid voice / throat clearing Follow with Dr Lamonte Sakai in 6 weeks or sooner if you have any problems. If at that time your symptoms persist then we will consider an inspection of your airways.

## 2014-11-26 NOTE — Addendum Note (Signed)
Addended by: Inge Rise on: 11/26/2014 02:04 PM   Modules accepted: Orders

## 2014-11-26 NOTE — Progress Notes (Signed)
Subjective:    Patient ID: Derek [name], male    DOB: 1946/05/03, 69 y.o.   MRN: [id number]  HPI 69 yo man, former smoker (about 2-3 pk-yrs), hx of prostate CA, little other PMH. He had been well until end of 12/15. He had a URI at that time that was characterized by congestion, a feeling of congestion in his chest. He forced himself to cough but this was minimally productive. He was treated with abx and prednisone. He continued to have this sensation of mucous in his chest, raspy voice. He can force himself to cough and produce clear thick mucous. He has noticed that his breathing is not at baseline, he hasn';t done his usual exercise routine since that time also. He has had an experience like this before - happened to him 08/2013. Has not had a CXR or any other testing.    ROV 11/26/14 -- follow up visit for chest congestion and cough that sound like UA irritation syndrome. Spirometry today > normal airflows without any BD change. Since last time he had an apparent URI > nasal gtt and cough with mucous. He did take the omeprazole until last Saturday evening when he had some abd pain, diarrhea. Since he got better from the URI he has been improving, been able to go to the gym. He is still clearing his throat.    Review of Systems  Constitutional: Negative for fever and unexpected weight change.  HENT: Negative for congestion, dental problem, ear pain, nosebleeds, postnasal drip, rhinorrhea, sinus pressure, sneezing, sore throat and trouble swallowing.   Eyes: Negative for redness and itching.  Respiratory: Positive for cough and shortness of breath. Negative for chest tightness and wheezing.   Cardiovascular: Negative for palpitations and leg swelling.  Gastrointestinal: Negative for nausea and vomiting.  Genitourinary: Negative for dysuria.  Musculoskeletal: Negative for joint swelling.  Skin: Negative for rash.  Neurological: Negative for headaches.  Hematological: Does not bruise/bleed  easily.  Psychiatric/Behavioral: Negative for dysphoric mood. The patient is not nervous/anxious.     Past Medical History  Diagnosis Date  . Cancer of prostate     pT2c No Mx, Gleason 3+4=7  . Diverticulitis   . Kidney stones   . Rheumatic fever 1952-1953  . Basal cell carcinoma 2009    back  . Hypogonadism male   . Nephrolithiasis   . Left rotator cuff tear 07/10/2013     Family History  Problem Relation Age of Onset  . Breast cancer Mother   . Cancer Brother 70    brain tumor  . Heart disease      fam hx  . Arthritis Other     other relative  . Prostate cancer Other     nephew  . Lung cancer Other 58    nephew     History   Social History  . Marital Status: Married    Spouse Name: N/A  . Number of Children: N/A  . Years of Education: N/A   Occupational History  . retired Licensed conveyancer) Health visitor   Social History Main Topics  . Smoking status: Former Smoker    Quit date: 09/25/1971  . Smokeless tobacco: Never Used  . Alcohol Use: No  . Drug Use: No  . Sexual Activity: Not on file   Other Topics Concern  . Not on file   Social History Narrative   Regular exercise: yes   Runner, former marathon runner    No significant exposures.  Was  in [employer] No pets, no mold exposure.   No Known Allergies   Outpatient Prescriptions Prior to Visit  Medication Sig Dispense Refill  . albuterol (PROVENTIL HFA;VENTOLIN HFA) 108 (90 BASE) MCG/ACT inhaler Inhale 2 puffs into the lungs every 6 (six) hours as needed for wheezing or shortness of breath. 1 Inhaler 1  . omeprazole (PRILOSEC) 20 MG capsule Take 1 capsule (20 mg total) by mouth 2 (two) times daily before a meal. (Patient not taking: Reported on 11/26/2014) 60 capsule 1   No facility-administered medications prior to visit.         Objective:   Physical Exam Filed Vitals:   11/26/14 1327  BP: 122/70  Pulse: 58  Temp: 97 F (36.1 C)  TempSrc: Oral  Height: 5\' 11"   (1.803 m)  Weight: 174 lb (78.926 kg)  SpO2: 99%   Gen: Pleasant, well-nourished, in no distress,  normal affect  ENT: No lesions,  mouth clear,  oropharynx clear, no postnasal drip  Neck: No JVD, no TMG, no carotid bruits, he has expiratory stridor on forced expiration  Lungs: No use of accessory muscles, clear without rales or rhonchi  Cardiovascular: RRR, heart sounds normal, no murmur or gallops, no peripheral edema  Musculoskeletal: No deformities, no cyanosis or clubbing  Neuro: alert, non focal  Skin: Warm, no lesions or rashes      Assessment & Plan:  Chest congestion With UA irritation and throat clearing - he is a little better but still symptomatic. My trial of omeprazole was confounded by another URI. I still believe this is UA irritation syndrome. His spirometry today had normal airflows.   Please restart omeprazole 20mg  daily an hour before or after food Start chlorpheniramine 4mg  up to every 6 hours if needed for congestion or coughing Set a time to practice voice rest Try to avoid voice / throat clearing Follow with Dr Lamonte Sakai in 6 weeks or sooner if you have any problems. If at that time your symptoms persist then we will consider an inspection of your airways.

## 2014-11-26 NOTE — Assessment & Plan Note (Signed)
With UA irritation and throat clearing - he is a little better but still symptomatic. My trial of omeprazole was confounded by another URI. I still believe this is UA irritation syndrome. His spirometry today had normal airflows.   Please restart omeprazole 20mg  daily an hour before or after food Start chlorpheniramine 4mg  up to every 6 hours if needed for congestion or coughing Set a time to practice voice rest Try to avoid voice / throat clearing Follow with Dr Lamonte Sakai in 6 weeks or sooner if you have any problems. If at that time your symptoms persist then we will consider an inspection of your airways.

## 2014-12-22 ENCOUNTER — Ambulatory Visit (INDEPENDENT_AMBULATORY_CARE_PROVIDER_SITE_OTHER): Payer: Medicare Other | Admitting: *Deleted

## 2014-12-22 DIAGNOSIS — Z23 Encounter for immunization: Secondary | ICD-10-CM

## 2014-12-23 ENCOUNTER — Ambulatory Visit: Payer: No Typology Code available for payment source

## 2015-01-20 ENCOUNTER — Ambulatory Visit: Payer: Medicare Other | Admitting: Emergency Medicine

## 2015-03-21 ENCOUNTER — Other Ambulatory Visit: Payer: Self-pay

## 2015-03-21 DIAGNOSIS — C61 Malignant neoplasm of prostate: Secondary | ICD-10-CM | POA: Diagnosis not present

## 2015-04-05 DIAGNOSIS — C61 Malignant neoplasm of prostate: Secondary | ICD-10-CM | POA: Diagnosis not present

## 2015-04-21 ENCOUNTER — Telehealth: Payer: Self-pay | Admitting: Family Medicine

## 2015-04-21 NOTE — Telephone Encounter (Signed)
He is due for a physical in September.  He can wait until then or he can come in for nurse visit for Prevnar only.  I personally would just schedule him for a CPE with fasting labs prior in September and we can do the immunization at that visit but it is totally up to him.

## 2015-04-21 NOTE — Telephone Encounter (Signed)
Pt called. Needs booster for pneumonia. Is this ok to schedule?

## 2015-04-22 NOTE — Telephone Encounter (Signed)
Scheduled 05/03/15 for prevnar Labs 06/13/15 cpe 06/20/15

## 2015-04-22 NOTE — Telephone Encounter (Signed)
Spoke to pt. He is going to call back and schedule

## 2015-05-03 ENCOUNTER — Ambulatory Visit (INDEPENDENT_AMBULATORY_CARE_PROVIDER_SITE_OTHER): Payer: Medicare Other | Admitting: *Deleted

## 2015-05-03 ENCOUNTER — Ambulatory Visit: Payer: Medicare Other

## 2015-05-03 DIAGNOSIS — Z23 Encounter for immunization: Secondary | ICD-10-CM

## 2015-06-12 ENCOUNTER — Other Ambulatory Visit: Payer: Self-pay | Admitting: Family Medicine

## 2015-06-12 DIAGNOSIS — Z8546 Personal history of malignant neoplasm of prostate: Secondary | ICD-10-CM

## 2015-06-12 DIAGNOSIS — E785 Hyperlipidemia, unspecified: Secondary | ICD-10-CM

## 2015-06-12 DIAGNOSIS — Z125 Encounter for screening for malignant neoplasm of prostate: Secondary | ICD-10-CM

## 2015-06-12 DIAGNOSIS — Z79899 Other long term (current) drug therapy: Secondary | ICD-10-CM

## 2015-06-13 ENCOUNTER — Other Ambulatory Visit (INDEPENDENT_AMBULATORY_CARE_PROVIDER_SITE_OTHER): Payer: Medicare Other

## 2015-06-13 DIAGNOSIS — Z79899 Other long term (current) drug therapy: Secondary | ICD-10-CM

## 2015-06-13 DIAGNOSIS — E785 Hyperlipidemia, unspecified: Secondary | ICD-10-CM | POA: Diagnosis not present

## 2015-06-13 DIAGNOSIS — Z125 Encounter for screening for malignant neoplasm of prostate: Secondary | ICD-10-CM

## 2015-06-13 DIAGNOSIS — Z8546 Personal history of malignant neoplasm of prostate: Secondary | ICD-10-CM

## 2015-06-13 LAB — CBC WITH DIFFERENTIAL/PLATELET
BASOS ABS: 0 10*3/uL (ref 0.0–0.1)
Basophils Relative: 0.6 % (ref 0.0–3.0)
Eosinophils Absolute: 0.2 10*3/uL (ref 0.0–0.7)
Eosinophils Relative: 3.7 % (ref 0.0–5.0)
HEMATOCRIT: 42.7 % (ref 39.0–52.0)
Hemoglobin: 14.3 g/dL (ref 13.0–17.0)
LYMPHS PCT: 30.6 % (ref 12.0–46.0)
Lymphs Abs: 1.7 10*3/uL (ref 0.7–4.0)
MCHC: 33.6 g/dL (ref 30.0–36.0)
MCV: 93.2 fl (ref 78.0–100.0)
MONOS PCT: 6.4 % (ref 3.0–12.0)
Monocytes Absolute: 0.4 10*3/uL (ref 0.1–1.0)
NEUTROS ABS: 3.3 10*3/uL (ref 1.4–7.7)
Neutrophils Relative %: 58.7 % (ref 43.0–77.0)
PLATELETS: 236 10*3/uL (ref 150.0–400.0)
RBC: 4.57 Mil/uL (ref 4.22–5.81)
RDW: 13.9 % (ref 11.5–15.5)
WBC: 5.7 10*3/uL (ref 4.0–10.5)

## 2015-06-13 LAB — BASIC METABOLIC PANEL
BUN: 17 mg/dL (ref 6–23)
CHLORIDE: 104 meq/L (ref 96–112)
CO2: 31 mEq/L (ref 19–32)
Calcium: 9.5 mg/dL (ref 8.4–10.5)
Creatinine, Ser: 1.07 mg/dL (ref 0.40–1.50)
GFR: 72.34 mL/min (ref >60.00)
GLUCOSE: 85 mg/dL (ref 70–99)
POTASSIUM: 4.1 meq/L (ref 3.5–5.1)
SODIUM: 142 meq/L (ref 135–145)

## 2015-06-13 LAB — PSA, MEDICARE: PSA: 0.04 ng/ml — ABNORMAL LOW (ref 0.10–4.00)

## 2015-06-13 LAB — HEPATIC FUNCTION PANEL
ALT: 15 U/L (ref 0–53)
AST: 22 U/L (ref 0–37)
Albumin: 4.1 g/dL (ref 3.5–5.2)
Alkaline Phosphatase: 73 U/L (ref 39–117)
BILIRUBIN TOTAL: 0.6 mg/dL (ref 0.2–1.2)
Bilirubin, Direct: 0.1 mg/dL (ref 0.0–0.3)
Total Protein: 6.8 g/dL (ref 6.0–8.3)

## 2015-06-13 LAB — LIPID PANEL
CHOL/HDL RATIO: 3
Cholesterol: 186 mg/dL (ref 0–200)
HDL: 65.4 mg/dL (ref >39.00)
LDL CALC: 111 mg/dL — AB (ref 0–99)
NONHDL: 120.21
Triglycerides: 48 mg/dL (ref 0.0–149.0)
VLDL: 9.6 mg/dL (ref 0.0–40.0)

## 2015-06-20 ENCOUNTER — Encounter: Payer: Self-pay | Admitting: Family Medicine

## 2015-06-20 ENCOUNTER — Ambulatory Visit (INDEPENDENT_AMBULATORY_CARE_PROVIDER_SITE_OTHER): Payer: Medicare Other | Admitting: Family Medicine

## 2015-06-20 VITALS — BP 90/62 | HR 42 | Temp 98.5°F | Ht 68.75 in | Wt 171.8 lb

## 2015-06-20 DIAGNOSIS — Z0000 Encounter for general adult medical examination without abnormal findings: Secondary | ICD-10-CM

## 2015-06-20 NOTE — Progress Notes (Signed)
Pre visit review using our clinic review tool, if applicable. No additional management support is needed unless otherwise documented below in the visit note. 

## 2015-06-20 NOTE — Progress Notes (Signed)
Dr. Frederico Hamman T. Copland, MD, [facility] [place], [address] Phone: [phone] Fax: [phone]  06/20/2015  Patient: Derek [name], MRN: [id number], DOB: 07/05/1946, 69 y.o.  Primary Physician:  [name], MD  Chief Complaint: Annual Exam  Subjective:   Derek [name] is a 69 y.o. pleasant patient who presents for a medicare wellness examination:  Preventative Health Maintenance Visit:  Health Maintenance Summary Reviewed and updated, unless pt declines services.  Tobacco History Reviewed. Alcohol: No concerns, no excessive use Exercise Habits: 3-4 days a week STD concerns: no risk or activity to increase risk Drug Use: None Encouraged self-testicular check  Health Maintenance  Topic Date Due  . Hepatitis C Screening  07-01-1946  . INFLUENZA VACCINE  04/25/2015  . COLONOSCOPY  12/17/2022  . TETANUS/TDAP  12/21/2024  . ZOSTAVAX  Completed  . PNA vac Low Risk Adult  Completed    Immunization History  Administered Date(s) Administered  . Influenza,inj,Quad PF,36+ Mos 06/25/2013, 06/16/2014  . Pneumococcal Conjugate-13 05/03/2015  . Pneumococcal Polysaccharide-23 08/14/2006, 06/25/2013  . Tdap 12/22/2014  . Zoster 08/14/2006    Patient Active Problem List   Diagnosis Date Noted  . Left rotator cuff tear 07/10/2013  . Scrotal discoloration / purpling ?varicocele 09/08/2012  . Bilateral inguinal hernia (BIH) s/p lap repair 07/06/2012 06/24/2012  . PROSTATE CANCER, HX OF 11/10/2008  . CARCINOMA, BASAL CELL, HX OF 11/10/2008  . RHEUMATIC FEVER, HX OF 11/10/2008  . DIVERTICULITIS, HX OF 11/10/2008   Past Medical History  Diagnosis Date  . Cancer of prostate     pT2c No Mx, Gleason 3+4=7  . Diverticulitis   . Kidney stones   . Rheumatic fever 1952-1953  . Basal cell carcinoma 2009    back  . Hypogonadism male   . Nephrolithiasis   . Left rotator cuff tear 07/10/2013   Past Surgical  History  Procedure Laterality Date  . Rotator cuff surgery  2002    Gioffre-rt  . Knee surgery  2005    Duda- arthroscopy, partial medial menisectomy-lt  . Prostate surgery  12/19/06    robotic prostatectomy  . Bilateral inguinal hernia repair  07/10/2012    lap BIH repairs  . Hernia repair  07/10/12    LIH/rih-umb  . Colonoscopy    . Shoulder arthroscopy with rotator cuff repair and subacromial decompression Left 07/10/2013    Procedure: LEFT SHOULDER ARTHROSCOPY WITH ARTHROSCOPIC ROTATOR CUFF REPAIR AND SUBACROMIAL DECOMPRESSION, PARTIAL ACROMIOPLASTY WITH CORACROMIAL RELEASE;  Surgeon: Johnny Bridge, MD;  Location: [facility];  Service: Orthopedics;  Laterality: Left;   Social History   Social History  . Marital Status: Married    Spouse Name: N/A  . Number of Children: N/A  . Years of Education: N/A   Occupational History  . retired Licensed conveyancer) Health visitor   Social History Main Topics  . Smoking status: Former Smoker    Quit date: 09/25/1971  . Smokeless tobacco: Never Used  . Alcohol Use: No  . Drug Use: No  . Sexual Activity: Not on file   Other Topics Concern  . Not on file   Social History Narrative   Regular exercise: yes   Runner, former marathon runner   Family History  Problem Relation Age of Onset  . Breast cancer Mother   . Cancer Brother 8    brain tumor  . Heart disease      fam hx  .  Arthritis Other     other relative  . Prostate cancer Other     nephew  . Lung cancer Other 49    nephew   No Known Allergies  Medication list has been reviewed and updated.   General: Denies fever, chills, sweats. No significant weight loss. Eyes: Denies blurring,significant itching ENT: Denies earache, sore throat, and hoarseness. Cardiovascular: Denies chest pains, palpitations, dyspnea on exertion Respiratory: Denies cough, dyspnea at rest,wheeezing Breast: no concerns about lumps GI: Denies nausea, vomiting, diarrhea,  constipation, change in bowel habits, abdominal pain, melena, hematochezia GU: Denies penile discharge, ED, urinary flow / outflow problems. No STD concerns. Musculoskeletal: Denies back pain, joint pain Derm: Denies rash, itching Neuro: Denies  paresthesias, frequent falls, frequent headaches Psych: Denies depression, anxiety Endocrine: Denies cold intolerance, heat intolerance, polydipsia Heme: Denies enlarged lymph nodes Allergy: No hayfever  Objective:   BP 90/62 mmHg  Pulse 42  Temp(Src) 98.5 F (36.9 C) (Oral)  Ht 5' 8.75" (1.746 m)  Wt 171 lb 12 oz (77.905 kg)  BMI 25.56 kg/m2  The patient completed a fall screen and PHQ-2 and PHQ-9 if necessary, which is documented in the EHR. The CMA/LPN/RN who assisted the patient verbally completed with them and documented results in [facility] link EHR.   Hearing Screening   Method: Audiometry   125Hz 250Hz 500Hz 1000Hz 2000Hz 4000Hz 8000Hz  Right ear:   20 20 20 25   Left ear:   20 20 29 40   Vision Screening Comments: Yearly Eye Exam with Dr. Sharpiro 07/20/2015.  GEN: well developed, well nourished, no acute distress Eyes: conjunctiva and lids normal, PERRLA, EOMI ENT: TM clear, nares clear, oral exam WNL Neck: supple, no lymphadenopathy, no thyromegaly, no JVD Pulm: clear to auscultation and percussion, respiratory effort normal CV: regular rate and rhythm, S1-S2, no murmur, rub or gallop, no bruits, peripheral pulses normal and symmetric, no cyanosis, clubbing, edema or varicosities GI: soft, non-tender; no hepatosplenomegaly, masses; active bowel sounds all quadrants GU: no hernia, testicular mass, penile discharge Lymph: no cervical, axillary or inguinal adenopathy MSK: gait normal, muscle tone and strength WNL, no joint swelling, effusions, discoloration, crepitus  SKIN: clear, good turgor, color WNL, no rashes, lesions, or ulcerations Neuro: normal mental status, normal strength, sensation, and motion Psych: alert;  oriented to person, place and time, normally interactive and not anxious or depressed in appearance.  All labs reviewed with patient.  Lipids:    Component Value Date/Time   CHOL 186 06/13/2015 0827   TRIG 48.0 06/13/2015 0827   HDL 65.40 06/13/2015 0827   LDLDIRECT 112.7 07/02/2011 1104   VLDL 9.6 06/13/2015 0827   CHOLHDL 3 06/13/2015 0827   CBC: CBC Latest Ref Rng 06/13/2015 06/07/2014 07/10/2013  WBC 4.0 - 10.5 K/uL 5.7 5.7 -  Hemoglobin 13.0 - 17.0 g/dL 14.3 14.1 13.6  Hematocrit 39.0 - 52.0 % 42.7 42.1 -  Platelets 150.0 - 400.0 K/uL 236.0 223.0 -    Basic Metabolic Panel:    Component Value Date/Time   NA 142 06/13/2015 0827   K 4.1 06/13/2015 0827   CL 104 06/13/2015 0827   CO2 31 06/13/2015 0827   BUN 17 06/13/2015 0827   CREATININE 1.07 06/13/2015 0827   GLUCOSE 85 06/13/2015 0827   CALCIUM 9.5 06/13/2015 0827   Hepatic Function Latest Ref Rng 06/13/2015 06/07/2014 06/25/2013  Total Protein 6.0 - 8.3 g/dL 6.8 6.8 8.0  Albumin 3.5 - 5.2 g/dL 4.1 4.0 4.7  AST 0 - 37   U/L _0 ALT 0 - 53 U/L _1 Alk Phosphatase 39 - 117 U/L 73 70 92  Total Bilirubin 0.2 - 1.2 mg/dL 0.6 0.5 0.8  Bilirubin, Direct 0.0 - 0.3 mg/dL 0.1 0.0 -    Lab Results  Component Value Date   TSH 2.99 06/05/2010   Lab Results  Component Value Date   PSA 0.04* 06/13/2015   PSA 0.02* 04/08/2013   PSA 0.02* 07/02/2011    Assessment and Plan:   Routine general medical examination at a health care facility  Health Maintenance Exam: The patient's preventative maintenance and recommended screening tests for an annual wellness exam were reviewed in full today. Brought up to date unless services declined.  Counselled on the importance of diet, exercise, and its role in overall health and mortality. The patient's FH and SH was reviewed, including their home life, tobacco status, and drug and alcohol status.  I have personally reviewed the Medicare Annual Wellness questionnaire and  have noted 1. The patient's medical and social history 2. Their use of alcohol, tobacco or illicit drugs 3. Their current medications and supplements 4. The patient's functional ability including ADL's, fall risks, home safety risks and hearing or visual             impairment. 5. Diet and physical activities 6. Evidence for depression or mood disorders 7. Reviewed Updated provider list, see scanned forms and [facility] Snapshot.   The patients weight, height, BMI and visual acuity have been recorded in the chart I have made referrals, counseling and provided education to the patient based review of the above and I have provided the pt with a written personalized care plan for preventive services.  I have provided the patient with a copy of your personalized plan for preventive services. Instructed to take the time to review along with their updated medication list.  Doing great.   Follow-up: No Follow-up on file. Or follow-up in 1 year for complete physical examination  New Prescriptions   No medications on file   No orders of the defined types were placed in this encounter.    Signed,  Maud Deed. Copland, MD   Patient's Medications  New Prescriptions   No medications on file  Previous Medications   No medications on file  Modified Medications   No medications on file  Discontinued Medications   ALBUTEROL (PROVENTIL HFA;VENTOLIN HFA) 108 (90 BASE) MCG/ACT INHALER    Inhale 2 puffs into the lungs every 6 (six) hours as needed for wheezing or shortness of breath.   OMEPRAZOLE (PRILOSEC) 20 MG CAPSULE    Take 1 capsule (20 mg total) by mouth daily.

## 2015-07-21 ENCOUNTER — Ambulatory Visit (INDEPENDENT_AMBULATORY_CARE_PROVIDER_SITE_OTHER): Payer: Medicare Other

## 2015-07-21 DIAGNOSIS — Z23 Encounter for immunization: Secondary | ICD-10-CM | POA: Diagnosis not present

## 2015-08-31 DIAGNOSIS — D1801 Hemangioma of skin and subcutaneous tissue: Secondary | ICD-10-CM | POA: Diagnosis not present

## 2015-08-31 DIAGNOSIS — L309 Dermatitis, unspecified: Secondary | ICD-10-CM | POA: Diagnosis not present

## 2015-08-31 DIAGNOSIS — L280 Lichen simplex chronicus: Secondary | ICD-10-CM | POA: Diagnosis not present

## 2015-08-31 DIAGNOSIS — Z85828 Personal history of other malignant neoplasm of skin: Secondary | ICD-10-CM | POA: Diagnosis not present

## 2016-03-29 DIAGNOSIS — C61 Malignant neoplasm of prostate: Secondary | ICD-10-CM | POA: Diagnosis not present

## 2016-04-11 DIAGNOSIS — Z8546 Personal history of malignant neoplasm of prostate: Secondary | ICD-10-CM | POA: Diagnosis not present

## 2016-04-27 ENCOUNTER — Telehealth: Payer: Self-pay | Admitting: Family Medicine

## 2016-04-27 NOTE — Telephone Encounter (Signed)
LM for pt to sch AWV and CPE, mn °

## 2016-06-14 DIAGNOSIS — M180 Bilateral primary osteoarthritis of first carpometacarpal joints: Secondary | ICD-10-CM | POA: Diagnosis not present

## 2016-06-14 DIAGNOSIS — M65342 Trigger finger, left ring finger: Secondary | ICD-10-CM | POA: Diagnosis not present

## 2016-06-14 DIAGNOSIS — M79641 Pain in right hand: Secondary | ICD-10-CM | POA: Diagnosis not present

## 2016-06-14 DIAGNOSIS — G8929 Other chronic pain: Secondary | ICD-10-CM | POA: Diagnosis not present

## 2016-06-14 DIAGNOSIS — M79642 Pain in left hand: Secondary | ICD-10-CM | POA: Diagnosis not present

## 2016-06-25 ENCOUNTER — Other Ambulatory Visit (INDEPENDENT_AMBULATORY_CARE_PROVIDER_SITE_OTHER): Payer: Medicare Other

## 2016-06-25 DIAGNOSIS — E784 Other hyperlipidemia: Secondary | ICD-10-CM

## 2016-06-25 DIAGNOSIS — E7849 Other hyperlipidemia: Secondary | ICD-10-CM

## 2016-06-25 DIAGNOSIS — Z79899 Other long term (current) drug therapy: Secondary | ICD-10-CM | POA: Diagnosis not present

## 2016-06-25 DIAGNOSIS — Z1159 Encounter for screening for other viral diseases: Secondary | ICD-10-CM

## 2016-06-25 LAB — CBC WITH DIFFERENTIAL/PLATELET
BASOS ABS: 0 10*3/uL (ref 0.0–0.1)
Basophils Relative: 0.7 % (ref 0.0–3.0)
Eosinophils Absolute: 0.2 10*3/uL (ref 0.0–0.7)
Eosinophils Relative: 3.5 % (ref 0.0–5.0)
HCT: 42.3 % (ref 39.0–52.0)
Hemoglobin: 14.3 g/dL (ref 13.0–17.0)
LYMPHS ABS: 2 10*3/uL (ref 0.7–4.0)
Lymphocytes Relative: 34.8 % (ref 12.0–46.0)
MCHC: 33.8 g/dL (ref 30.0–36.0)
MCV: 92.1 fl (ref 78.0–100.0)
MONO ABS: 0.4 10*3/uL (ref 0.1–1.0)
Monocytes Relative: 7.5 % (ref 3.0–12.0)
NEUTROS ABS: 3.1 10*3/uL (ref 1.4–7.7)
NEUTROS PCT: 53.5 % (ref 43.0–77.0)
PLATELETS: 230 10*3/uL (ref 150.0–400.0)
RBC: 4.6 Mil/uL (ref 4.22–5.81)
RDW: 13.8 % (ref 11.5–15.5)
WBC: 5.8 10*3/uL (ref 4.0–10.5)

## 2016-06-25 LAB — HEPATIC FUNCTION PANEL
ALK PHOS: 73 U/L (ref 39–117)
ALT: 19 U/L (ref 0–53)
AST: 20 U/L (ref 0–37)
Albumin: 4 g/dL (ref 3.5–5.2)
BILIRUBIN DIRECT: 0.1 mg/dL (ref 0.0–0.3)
TOTAL PROTEIN: 6.5 g/dL (ref 6.0–8.3)
Total Bilirubin: 0.5 mg/dL (ref 0.2–1.2)

## 2016-06-25 LAB — LIPID PANEL
CHOLESTEROL: 209 mg/dL — AB (ref 0–200)
HDL: 69.5 mg/dL (ref >39.00)
LDL Cholesterol: 129 mg/dL — ABNORMAL HIGH (ref 0–99)
NONHDL: 139.91
Total CHOL/HDL Ratio: 3
Triglycerides: 55 mg/dL (ref 0.0–149.0)
VLDL: 11 mg/dL (ref 0.0–40.0)

## 2016-06-25 LAB — BASIC METABOLIC PANEL
BUN: 17 mg/dL (ref 6–23)
CALCIUM: 9.2 mg/dL (ref 8.4–10.5)
CO2: 31 mEq/L (ref 19–32)
CREATININE: 1.13 mg/dL (ref 0.40–1.50)
Chloride: 104 mEq/L (ref 96–112)
GFR: 67.72 mL/min (ref >60.00)
Glucose, Bld: 82 mg/dL (ref 70–99)
POTASSIUM: 4 meq/L (ref 3.5–5.1)
Sodium: 141 mEq/L (ref 135–145)

## 2016-06-26 LAB — HEPATITIS C ANTIBODY: HCV Ab: NEGATIVE

## 2016-07-02 ENCOUNTER — Ambulatory Visit (INDEPENDENT_AMBULATORY_CARE_PROVIDER_SITE_OTHER): Payer: Medicare Other | Admitting: Family Medicine

## 2016-07-02 ENCOUNTER — Encounter: Payer: Self-pay | Admitting: Family Medicine

## 2016-07-02 VITALS — BP 96/56 | HR 57 | Temp 97.7°F | Ht 68.5 in | Wt 173.5 lb

## 2016-07-02 DIAGNOSIS — Z0000 Encounter for general adult medical examination without abnormal findings: Secondary | ICD-10-CM | POA: Diagnosis not present

## 2016-07-02 DIAGNOSIS — Z23 Encounter for immunization: Secondary | ICD-10-CM | POA: Diagnosis not present

## 2016-07-02 NOTE — Progress Notes (Signed)
Pre visit review using our clinic review tool, if applicable. No additional management support is needed unless otherwise documented below in the visit note. 

## 2016-07-02 NOTE — Progress Notes (Signed)
Dr. Frederico Hamman T. Dezmond Downie, MD, [facility] [place], [address] Phone: [phone] Fax: [phone]  07/02/2016  Patient: Derek [name], MRN: [id number], DOB: 1946/04/15, 70 y.o.  Primary Physician:  [name], MD   Chief Complaint  Patient presents with  . Medicare Wellness   Subjective:   Derek [name] is a 70 y.o. pleasant patient who presents with the following:  Preventative Health Maintenance Visit:  Health Maintenance Summary Reviewed and updated, unless pt declines services.  Tobacco History Reviewed. Alcohol: No concerns, no excessive use Exercise Habits: Some activity, rec at least 30 mins 5 times a week - less exercise than normal over the summer.  STD concerns: no risk or activity to increase risk Drug Use: None Encouraged self-testicular check  O/w doing great.  Health Maintenance  Topic Date Due  . COLONOSCOPY  12/17/2022  . TETANUS/TDAP  12/21/2024  . INFLUENZA VACCINE  Completed  . ZOSTAVAX  Completed  . Hepatitis C Screening  Completed  . PNA vac Low Risk Adult  Completed   Immunization History  Administered Date(s) Administered  . Influenza,inj,Quad PF,36+ Mos 06/25/2013, 06/16/2014, 07/21/2015, 07/02/2016  . Pneumococcal Conjugate-13 05/03/2015  . Pneumococcal Polysaccharide-23 08/14/2006, 06/25/2013  . Tdap 12/22/2014  . Zoster 08/14/2006   Patient Active Problem List   Diagnosis Date Noted  . Left rotator cuff tear 07/10/2013  . Scrotal discoloration / purpling ?varicocele 09/08/2012  . Bilateral inguinal hernia (BIH) s/p lap repair 07/06/2012 06/24/2012  . PROSTATE CANCER, HX OF 11/10/2008  . CARCINOMA, BASAL CELL, HX OF 11/10/2008  . RHEUMATIC FEVER, HX OF 11/10/2008  . DIVERTICULITIS, HX OF 11/10/2008   Past Medical History:  Diagnosis Date  . Basal cell carcinoma 2009   back  . Cancer of prostate ([place])    pT2c No Mx, Gleason 3+4=7  . Diverticulitis   .  Hypogonadism male   . Kidney stones   . Left rotator cuff tear 07/10/2013  . Nephrolithiasis   . Rheumatic fever 1952-1953   Past Surgical History:  Procedure Laterality Date  . bilateral inguinal hernia repair  07/10/2012   lap BIH repairs  . COLONOSCOPY    . HERNIA REPAIR  07/10/12   LIH/rih-umb  . KNEE SURGERY  2005   Duda- arthroscopy, partial medial menisectomy-lt  . PROSTATE SURGERY  12/19/06   robotic prostatectomy  . rotator cuff surgery  2002   Gioffre-rt  . SHOULDER ARTHROSCOPY WITH ROTATOR CUFF REPAIR AND SUBACROMIAL DECOMPRESSION Left 07/10/2013   Procedure: LEFT SHOULDER ARTHROSCOPY WITH ARTHROSCOPIC ROTATOR CUFF REPAIR AND SUBACROMIAL DECOMPRESSION, PARTIAL ACROMIOPLASTY WITH CORACROMIAL RELEASE;  Surgeon: Johnny Bridge, MD;  Location: [facility];  Service: Orthopedics;  Laterality: Left;   Social History   Social History  . Marital status: Married    Spouse name: N/A  . Number of children: N/A  . Years of education: N/A   Occupational History  . retired Licensed conveyancer) Health visitor   Social History Main Topics  . Smoking status: Former Smoker    Quit date: 09/25/1971  . Smokeless tobacco: Never Used  . Alcohol use No  . Drug use: No  . Sexual activity: Not on file   Other Topics Concern  . Not on file   Social History Narrative   Regular exercise: yes   Runner, former marathon runner   Family History  Problem Relation Age of Onset  . Breast cancer Mother   . Cancer Brother  51    brain tumor  . Heart disease      fam hx  . Arthritis Other     other relative  . Prostate cancer Other     nephew  . Lung cancer Other 16    nephew   No Known Allergies  Medication list has been reviewed and updated.   General: Denies fever, chills, sweats. No significant weight loss. Eyes: Denies blurring,significant itching ENT: Denies earache, sore throat, and hoarseness. Cardiovascular: Denies chest pains, palpitations, dyspnea on  exertion Respiratory: Denies cough, dyspnea at rest,wheeezing Breast: no concerns about lumps GI: Denies nausea, vomiting, diarrhea, constipation, change in bowel habits, abdominal pain, melena, hematochezia GU: Denies penile discharge, ED, urinary flow / outflow problems. No STD concerns. Musculoskeletal: Denies back pain, joint pain Derm: Denies rash, itching Neuro: Denies  paresthesias, frequent falls, frequent headaches Psych: Denies depression, anxiety Endocrine: Denies cold intolerance, heat intolerance, polydipsia Heme: Denies enlarged lymph nodes Allergy: No hayfever  Objective:   BP (!) 96/56   Pulse (!) 57   Temp 97.7 F (36.5 C) (Oral)   Ht 5' 8.5" (1.74 m)   Wt 173 lb 8 oz (78.7 kg)   BMI 26.00 kg/m  Ideal Body Weight: Weight in (lb) to have BMI = 25: 166.5   Hearing Screening   Method: Audiometry   '125Hz'  '250Hz'  '500Hz'  '1000Hz'  '2000Hz'  '3000Hz'  '4000Hz'  '6000Hz'  '8000Hz'   Right ear:   '20 20 20  ' 40    Left ear:   '20 20 20  ' 0      Visual Acuity Screening   Right eye Left eye Both eyes  Without correction: '20/20 20/20 20/15 '  With correction:       GEN: well developed, well nourished, no acute distress Eyes: conjunctiva and lids normal, PERRLA, EOMI ENT: TM clear, nares clear, oral exam WNL Neck: supple, no lymphadenopathy, no thyromegaly, no JVD Pulm: clear to auscultation and percussion, respiratory effort normal CV: regular rate and rhythm, S1-S2, no murmur, rub or gallop, no bruits, peripheral pulses normal and symmetric, no cyanosis, clubbing, edema or varicosities GI: soft, non-tender; no hepatosplenomegaly, masses; active bowel sounds all quadrants GU: no hernia, testicular mass, penile discharge Lymph: no cervical, axillary or inguinal adenopathy MSK: gait normal, muscle tone and strength WNL, no joint swelling, effusions, discoloration, crepitus  SKIN: clear, good turgor, color WNL, no rashes, lesions, or ulcerations Neuro: normal mental status, normal strength,  sensation, and motion Psych: alert; oriented to person, place and time, normally interactive and not anxious or depressed in appearance. All labs reviewed with patient.  Lipids:    Component Value Date/Time   CHOL 209 (H) 06/25/2016 0833   TRIG 55.0 06/25/2016 0833   HDL 69.50 06/25/2016 0833   LDLDIRECT 112.7 07/02/2011 1104   VLDL 11.0 06/25/2016 0833   CHOLHDL 3 06/25/2016 0833   CBC: CBC Latest Ref Rng & Units 06/25/2016 06/13/2015 06/07/2014  WBC 4.0 - 10.5 K/uL 5.8 5.7 5.7  Hemoglobin 13.0 - 17.0 g/dL 14.3 14.3 14.1  Hematocrit 39.0 - 52.0 % 42.3 42.7 42.1  Platelets 150.0 - 400.0 K/uL 230.0 236.0 973.5    Basic Metabolic Panel:    Component Value Date/Time   NA 141 06/25/2016 0833   K 4.0 06/25/2016 0833   CL 104 06/25/2016 0833   CO2 31 06/25/2016 0833   BUN 17 06/25/2016 0833   CREATININE 1.13 06/25/2016 0833   GLUCOSE 82 06/25/2016 0833   CALCIUM 9.2 06/25/2016 0833   Hepatic Function Latest Ref Rng &  Units 06/25/2016 06/13/2015 06/07/2014  Total Protein 6.0 - 8.3 g/dL 6.5 6.8 6.8  Albumin 3.5 - 5.2 g/dL 4.0 4.1 4.0  AST 0 - 37 U/L '20 22 23  ' ALT 0 - 53 U/L '19 15 17  ' Alk Phosphatase 39 - 117 U/L 73 73 70  Total Bilirubin 0.2 - 1.2 mg/dL 0.5 0.6 0.5  Bilirubin, Direct 0.0 - 0.3 mg/dL 0.1 0.1 0.0    Lab Results  Component Value Date   TSH 2.99 06/05/2010   Lab Results  Component Value Date   PSA 0.04 (L) 06/13/2015   PSA 0.02 (L) 04/08/2013   PSA 0.02 (L) 07/02/2011    Assessment and Plan:   Healthcare maintenance  Need for prophylactic vaccination and inoculation against influenza - Plan: Flu Vaccine QUAD 36+ mos IM  Health Maintenance Exam: The patient's preventative maintenance and recommended screening tests for an annual wellness exam were reviewed in full today. Brought up to date unless services declined.  Counselled on the importance of diet, exercise, and its role in overall health and mortality. The patient's FH and SH was reviewed,  including their home life, tobacco status, and drug and alcohol status.  Follow-up: No Follow-up on file. Unless noted, follow-up in 1 year for Health Maintenance Exam.  Orders Placed This Encounter  Procedures  . Flu Vaccine QUAD 36+ mos IM    Signed,  Jamesetta Greenhalgh T. Mao Lockner, MD   Patient's Medications  New Prescriptions   No medications on file  Previous Medications   ASPIRIN 81 MG TABLET    Take 81 mg by mouth daily.  Modified Medications   No medications on file  Discontinued Medications   No medications on file

## 2016-07-17 DIAGNOSIS — M65342 Trigger finger, left ring finger: Secondary | ICD-10-CM | POA: Diagnosis not present

## 2016-07-17 DIAGNOSIS — M79642 Pain in left hand: Secondary | ICD-10-CM | POA: Diagnosis not present

## 2016-07-17 DIAGNOSIS — M79641 Pain in right hand: Secondary | ICD-10-CM | POA: Diagnosis not present

## 2016-07-17 DIAGNOSIS — M180 Bilateral primary osteoarthritis of first carpometacarpal joints: Secondary | ICD-10-CM | POA: Diagnosis not present

## 2016-10-30 ENCOUNTER — Ambulatory Visit (INDEPENDENT_AMBULATORY_CARE_PROVIDER_SITE_OTHER): Payer: Medicare Other | Admitting: Family Medicine

## 2016-10-30 ENCOUNTER — Telehealth: Payer: Self-pay

## 2016-10-30 ENCOUNTER — Encounter: Payer: Self-pay | Admitting: Family Medicine

## 2016-10-30 VITALS — BP 102/70 | HR 82 | Temp 97.8°F | Wt 182.5 lb

## 2016-10-30 DIAGNOSIS — I499 Cardiac arrhythmia, unspecified: Secondary | ICD-10-CM | POA: Diagnosis not present

## 2016-10-30 DIAGNOSIS — I4891 Unspecified atrial fibrillation: Secondary | ICD-10-CM | POA: Diagnosis not present

## 2016-10-30 DIAGNOSIS — Z8679 Personal history of other diseases of the circulatory system: Secondary | ICD-10-CM | POA: Insufficient documentation

## 2016-10-30 LAB — CBC WITH DIFFERENTIAL/PLATELET
BASOS ABS: 0.1 10*3/uL (ref 0.0–0.1)
Basophils Relative: 0.9 % (ref 0.0–3.0)
EOS ABS: 0.1 10*3/uL (ref 0.0–0.7)
Eosinophils Relative: 1.7 % (ref 0.0–5.0)
HCT: 44.8 % (ref 39.0–52.0)
Hemoglobin: 15.5 g/dL (ref 13.0–17.0)
LYMPHS ABS: 2 10*3/uL (ref 0.7–4.0)
LYMPHS PCT: 25.6 % (ref 12.0–46.0)
MCHC: 34.7 g/dL (ref 30.0–36.0)
MCV: 92.4 fl (ref 78.0–100.0)
MONOS PCT: 8.7 % (ref 3.0–12.0)
Monocytes Absolute: 0.7 10*3/uL (ref 0.1–1.0)
NEUTROS PCT: 63.1 % (ref 43.0–77.0)
Neutro Abs: 4.8 10*3/uL (ref 1.4–7.7)
Platelets: 247 10*3/uL (ref 150.0–400.0)
RBC: 4.85 Mil/uL (ref 4.22–5.81)
RDW: 13.5 % (ref 11.5–15.5)
WBC: 7.7 10*3/uL (ref 4.0–10.5)

## 2016-10-30 LAB — BASIC METABOLIC PANEL
BUN: 19 mg/dL (ref 6–23)
CHLORIDE: 102 meq/L (ref 96–112)
CO2: 29 meq/L (ref 19–32)
CREATININE: 1.23 mg/dL (ref 0.40–1.50)
Calcium: 9.5 mg/dL (ref 8.4–10.5)
GFR: 61.35 mL/min (ref >60.00)
Glucose, Bld: 95 mg/dL (ref 70–99)
POTASSIUM: 4.5 meq/L (ref 3.5–5.1)
Sodium: 137 mEq/L (ref 135–145)

## 2016-10-30 LAB — TSH: TSH: 3.16 u[IU]/mL (ref 0.35–4.50)

## 2016-10-30 MED ORDER — RIVAROXABAN 20 MG PO TABS: 20.0000 mg | ORAL_TABLET | Freq: Every day | ORAL | 1 refills | Status: DC

## 2016-10-30 MED ORDER — APIXABAN 5 MG PO TABS: 5.0000 mg | ORAL_TABLET | Freq: Two times a day (BID) | ORAL | 1 refills | Status: DC

## 2016-10-30 NOTE — Patient Instructions (Addendum)
You have new onset atrial fibrillation - start eliquis or xarelto (whichever more affordable) in place of aspirin. Labs today We will refer you to cardiology for further evaluation and treatment.  Call us with any questions or concerns.   Atrial Fibrillation Atrial fibrillation is a type of irregular or rapid heartbeat (arrhythmia). In atrial fibrillation, the heart quivers continuously in a chaotic pattern. This occurs when parts of the heart receive disorganized signals that make the heart unable to pump blood normally. This can increase the risk for stroke, heart failure, and other heart-related conditions. There are different types of atrial fibrillation, including:  Paroxysmal atrial fibrillation. This type starts suddenly, and it usually stops on its own shortly after it starts.  Persistent atrial fibrillation. This type often lasts longer than a week. It may stop on its own or with treatment.  Long-lasting persistent atrial fibrillation. This type lasts longer than 12 months.  Permanent atrial fibrillation. This type does not go away. Talk with your health care provider to learn about the type of atrial fibrillation that you have. What are the causes? This condition is caused by some heart-related conditions or procedures, including:  A heart attack.  Coronary artery disease.  Heart failure.  Heart valve conditions.  High blood pressure.  Inflammation of the sac that surrounds the heart (pericarditis).  Heart surgery.  Certain heart rhythm disorders, such as Wolf-Parkinson-White syndrome. Other causes include:  Pneumonia.  Obstructive sleep apnea.  Blockage of an artery in the lungs (pulmonary embolism, or PE).  Lung cancer.  Chronic lung disease.  Thyroid problems, especially if the thyroid is overactive (hyperthyroidism).  Caffeine.  Excessive alcohol use or illegal drug use.  Use of some medicines, including certain decongestants and diet  pills. Sometimes, the cause cannot be found. What increases the risk? This condition is more likely to develop in:  People who are older in age.  People who smoke.  People who have diabetes mellitus.  People who are overweight (obese).  Athletes who exercise vigorously. What are the signs or symptoms? Symptoms of this condition include:  A feeling that your heart is beating rapidly or irregularly.  A feeling of discomfort or pain in your chest.  Shortness of breath.  Sudden light-headedness or weakness.  Getting tired easily during exercise. In some cases, there are no symptoms. How is this diagnosed? Your health care provider may be able to detect atrial fibrillation when taking your pulse. If detected, this condition may be diagnosed with:  An electrocardiogram (ECG).  A Holter monitor test that records your heartbeat patterns over a 24-hour period.  Transthoracic echocardiogram (TTE) to evaluate how blood flows through your heart.  Transesophageal echocardiogram (TEE) to view more detailed images of your heart.  A stress test.  Imaging tests, such as a CT scan or chest X-ray.  Blood tests. How is this treated? The main goals of treatment are to prevent blood clots from forming and to keep your heart beating at a normal rate and rhythm. The type of treatment that you receive depends on many factors, such as your underlying medical conditions and how you feel when you are experiencing atrial fibrillation. This condition may be treated with:  Medicine to slow down the heart rate, bring the heart's rhythm back to normal, or prevent clots from forming.  Electrical cardioversion. This is a procedure that resets your heart's rhythm by delivering a controlled, low-energy shock to the heart through your skin.  Different types of ablation, such as catheter  ablation, catheter ablation with pacemaker, or surgical ablation. These procedures destroy the heart tissues that send  abnormal signals. When the pacemaker is used, it is placed under your skin to help your heart beat in a regular rhythm. Follow these instructions at home:  Take over-the counter and prescription medicines only as told by your health care provider.  If your health care provider prescribed a blood-thinning medicine (anticoagulant), take it exactly as told. Taking too much blood-thinning medicine can cause bleeding. If you do not take enough blood-thinning medicine, you will not have the protection that you need against stroke and other problems.  Do not use tobacco products, including cigarettes, chewing tobacco, and e-cigarettes. If you need help quitting, ask your health care provider.  If you have obstructive sleep apnea, manage your condition as told by your health care provider.  Do not drink alcohol.  Do not drink beverages that contain caffeine, such as coffee, soda, and tea.  Maintain a healthy weight. Do not use diet pills unless your health care provider approves. Diet pills may make heart problems worse.  Follow diet instructions as told by your health care provider.  Exercise regularly as told by your health care provider.  Keep all follow-up visits as told by your health care provider. This is important. How is this prevented?  Avoid drinking beverages that contain caffeine or alcohol.  Avoid certain medicines, especially medicines that are used for breathing problems.  Avoid certain herbs and herbal medicines, such as those that contain ephedra or ginseng.  Do not use illegal drugs, such as cocaine and amphetamines.  Do not smoke.  Manage your high blood pressure. Contact a health care provider if:  You notice a change in the rate, rhythm, or strength of your heartbeat.  You are taking an anticoagulant and you notice increased bruising.  You tire more easily when you exercise or exert yourself. Get help right away if:  You have chest pain, abdominal pain,  sweating, or weakness.  You feel nauseous.  You notice blood in your vomit, bowel movement, or urine.  You have shortness of breath.  You suddenly have swollen feet and ankles.  You feel dizzy.  You have sudden weakness or numbness of the face, arm, or leg, especially on one side of the body.  You have trouble speaking, trouble understanding, or both (aphasia).  Your face or your eyelid droops on one side. These symptoms may represent a serious problem that is an emergency. Do not wait to see if the symptoms will go away. Get medical help right away. Call your local emergency services (911 in the [place] Do not drive yourself to the hospital.  This information is not intended to replace advice given to you by your health care provider. Make sure you discuss any questions you have with your health care provider. Document Released: 09/10/2005 Document Revised: 01/18/2016 Document Reviewed: 01/05/2015 Elsevier Interactive Patient Education  2017 [employer].

## 2016-10-30 NOTE — Progress Notes (Signed)
BP 102/70   Pulse 82 Comment: Irregular  Temp 97.8 F (36.6 C) (Oral)   Wt 182 lb 8 oz (82.8 kg)   SpO2 94%   BMI 27.35 kg/m    CC: oral pain, shortness of breath Subjective:    Patient ID: Derek [name], male    DOB: [birth year], 71 y.o.   MRN: [id number]  HPI: Derek [name] is a 71 y.o. male presenting on 10/30/2016 for Oral Pain (? thrush after abx) and Shortness of Breath   Several day history of mild dizziness described as unsteadiness and dyspnea that started after he took amoxicillin a few days after root canal. Some headache attributed to dental work. Some throat pain present as well as white spots in mouth after amox.   No fevers/chills, chest pain, cough, leg swelling.   Recent root canal x4 10/24/2016.  Ex marathon runner, stays active at gym weekly - running, weight training.  H/o rheumatic fever as child. No residual murmur from this.  H/o prostate cancer.   Relevant past medical, surgical, family and social history reviewed and updated as indicated. Interim medical history since our last visit reviewed. Allergies and medications reviewed and updated. Current Outpatient Prescriptions on File Prior to Visit  Medication Sig  . aspirin 81 MG tablet Take 81 mg by mouth daily.   No current facility-administered medications on file prior to visit.     Review of Systems Per HPI unless specifically indicated in ROS section     Objective:    BP 102/70   Pulse 82 Comment: Irregular  Temp 97.8 F (36.6 C) (Oral)   Wt 182 lb 8 oz (82.8 kg)   SpO2 94%   BMI 27.35 kg/m   Wt Readings from Last 3 Encounters:  10/30/16 182 lb 8 oz (82.8 kg)  07/02/16 173 lb 8 oz (78.7 kg)  06/20/15 171 lb 12 oz (77.9 kg)    Physical Exam  Constitutional: He appears well-developed and well-nourished. No distress.  HENT:  Head: Normocephalic and atraumatic.  Mouth/Throat: Oropharynx is clear and moist. No oropharyngeal exudate.  No plaques appreciated in mouth  Eyes:  Conjunctivae and EOM are normal. Pupils are equal, round, and reactive to light.  Neck: Normal range of motion. Neck supple. No thyromegaly present.  Cardiovascular: Normal rate, normal heart sounds and intact distal pulses.  An irregularly irregular rhythm present.  No murmur heard. Pulmonary/Chest: Effort normal and breath sounds normal. No respiratory distress. He has no wheezes. He has no rales.  Musculoskeletal: He exhibits no edema.  Lymphadenopathy:    He has no cervical adenopathy.  Skin: Skin is warm and dry. No rash noted.  Psychiatric: He has a normal mood and affect.  Nursing note and vitals reviewed.  EKG - atrial fibrillation 100s with LAD, no acute ST/T changes    Assessment & Plan:   Problem List Items Addressed This Visit    Atrial fibrillation ([place]) - Primary    New onset atrial fibrillation after recent dental procedure. Doubt amox contributing.  Check TSH, CBC, BMP today.  Start eliquis or xarelto in place of aspirin (whichever more affordable, both sent to pharmacy), refer to cardiology for further eval and consideration of cardioversion.  Pt agrees with plan.  No thrush appreciated today.  CHADS2VASC score = 1      Relevant Medications   apixaban (ELIQUIS) 5 MG TABS tablet   rivaroxaban (XARELTO) 20 MG TABS tablet   Other Relevant Orders   CBC with Differential/Platelet  Basic metabolic panel   TSH   Ambulatory referral to Cardiology    Other Visit Diagnoses    Irregular heart beat       Relevant Orders   EKG 12-Lead (Completed)       Follow up plan: No Follow-up on file.  [name], MD

## 2016-10-30 NOTE — Telephone Encounter (Signed)
Pt left v/m; pt seen earlier today and pt was to call Dr Darnell Level with which med he selected; pt selected Xarelto. FYI to Dr Darnell Level.

## 2016-10-30 NOTE — Progress Notes (Signed)
Pre visit review using our clinic review tool, if applicable. No additional management support is needed unless otherwise documented below in the visit note. 

## 2016-10-30 NOTE — Telephone Encounter (Signed)
Thank you :)

## 2016-10-30 NOTE — Assessment & Plan Note (Addendum)
New onset atrial fibrillation after recent dental procedure. Doubt amox contributing.  Check TSH, CBC, BMP today.  Start eliquis or xarelto in place of aspirin (whichever more affordable, both sent to pharmacy), refer to cardiology for further eval and consideration of cardioversion.  Pt agrees with plan.  No thrush appreciated today.  CHADS2VASC score = 1

## 2016-11-01 ENCOUNTER — Encounter: Payer: Self-pay | Admitting: Cardiology

## 2016-11-01 ENCOUNTER — Telehealth: Payer: Self-pay | Admitting: Family Medicine

## 2016-11-01 ENCOUNTER — Encounter: Payer: Self-pay | Admitting: *Deleted

## 2016-11-01 ENCOUNTER — Ambulatory Visit (INDEPENDENT_AMBULATORY_CARE_PROVIDER_SITE_OTHER): Payer: Medicare Other | Admitting: Cardiovascular Disease

## 2016-11-01 VITALS — BP 98/80 | HR 98 | Ht 68.5 in | Wt 180.1 lb

## 2016-11-01 DIAGNOSIS — I481 Persistent atrial fibrillation: Secondary | ICD-10-CM | POA: Diagnosis not present

## 2016-11-01 DIAGNOSIS — I4819 Other persistent atrial fibrillation: Secondary | ICD-10-CM

## 2016-11-01 DIAGNOSIS — Z8679 Personal history of other diseases of the circulatory system: Secondary | ICD-10-CM | POA: Diagnosis not present

## 2016-11-01 DIAGNOSIS — Z7901 Long term (current) use of anticoagulants: Secondary | ICD-10-CM

## 2016-11-01 NOTE — Patient Instructions (Signed)
Medication Instructions:  None  Labwork: Your physician recommends that you return for lab work at the same time as your nurse visit.   Testing/Procedures: Your physician has requested that you have an echocardiogram. Echocardiography is a painless test that uses sound waves to create images of your heart. It provides your doctor with information about the size and shape of your heart and how well your heart's chambers and valves are working. This procedure takes approximately one hour. There are no restrictions for this procedure.  Your physician has recommended that you have a Cardioversion (DCCV). Electrical Cardioversion uses a jolt of electricity to your heart either through paddles or wired patches attached to your chest. This is a controlled, usually prescheduled, procedure. Defibrillation is done under light anesthesia in the hospital, and you usually go home the day of the procedure. This is done to get your heart back into a normal rhythm. You are not awake for the procedure. Please see the instruction sheet given to you today.   Follow-Up: Your physician recommends that you schedule a follow-up appointment on 11/16/16 for an EKG and labs prior to your Cardioversion on Monday.  Your physician recommends that you schedule a follow-up appointment 1-2 weeks after your Cardioversion with Dr. Johnsie Cancel. (Cardioversion is 11/19/16)   Any Other Special Instructions Will Be Listed Below (If Applicable).     If you need a refill on your cardiac medications before your next appointment, please call your pharmacy.

## 2016-11-01 NOTE — Progress Notes (Signed)
Cardiology Office Note  NEW PATIENT VISIT    Date:  11/01/2016   ID:  INTI KROLL, DOB [birth year], MRN [id number]  PCP:  [name], MD  Cardiologist:  New Dr. Johnsie Cancel    Chief Complaint  Patient presents with  . Atrial Fibrillation      History of Present Illness: Derek [name] is a 71 y.o. male who presents for a fib referred from Dr. Copeland/Gutierrez.   A fib with RVR in PCP office of 100.  No prior cardiac hx or HTN.  He did have rheumatic fever as a child but no cardiac issues. Last echo was about 8 years ago and was told it was fine.    He has no chest pain and no SOB.  He is fatigued.  No dizziness.    This happened in the frame of 3 root canals in tooth and infection on amoxicillin.  Now off amoxicillin.  His CHA2DS2vasc score is 1 with age.  He does need permanent cap placed on the 19th.      Daughter is a Psychologist, forensic. Patient has fatigue Cap on tooth 19th should be the last dental work he needs for a while No bleeding diathesis  Retired from [employer]. Active. No previous cardiac disease TIA or stroke       Past Medical History:  Diagnosis Date  . Basal cell carcinoma 2009   back  . Cancer of prostate ([place])    pT2c No Mx, Gleason 3+4=7  . Diverticulitis   . Hypogonadism male   . Kidney stones   . Left rotator cuff tear 07/10/2013  . Nephrolithiasis   . Rheumatic fever 1952-1953    Past Surgical History:  Procedure Laterality Date  . bilateral inguinal hernia repair  07/10/2012   lap BIH repairs  . COLONOSCOPY    . HERNIA REPAIR  07/10/12   LIH/rih-umb  . KNEE SURGERY  2005   Duda- arthroscopy, partial medial menisectomy-lt  . PROSTATE SURGERY  12/19/06   robotic prostatectomy  . rotator cuff surgery  2002   Gioffre-rt  . SHOULDER ARTHROSCOPY WITH ROTATOR CUFF REPAIR AND SUBACROMIAL DECOMPRESSION Left 07/10/2013   Procedure: LEFT SHOULDER ARTHROSCOPY WITH ARTHROSCOPIC ROTATOR CUFF REPAIR AND SUBACROMIAL DECOMPRESSION, PARTIAL  ACROMIOPLASTY WITH CORACROMIAL RELEASE;  Surgeon: Johnny Bridge, MD;  Location: [place];  Service: Orthopedics;  Laterality: Left;     Current Outpatient Prescriptions  Medication Sig Dispense Refill  . rivaroxaban (XARELTO) 20 MG TABS tablet Take 1 tablet (20 mg total) by mouth daily with supper. 30 tablet 1   No current facility-administered medications for this visit.     Allergies:   Patient has no known allergies.    Social History:  The patient  reports that he quit smoking about 45 years ago. He has never used smokeless tobacco. He reports that he does not drink alcohol or use drugs.   Family History:  The patient's family history includes Arrhythmia in his father; Arthritis in his other; Breast cancer in his mother; Cancer (age of onset: 96) in his brother; Lung cancer (age of onset: 65) in his other; Prostate cancer in his other.    ROS:  General:no colds or fevers, no weight changes Skin:no rashes or ulcers HEENT:no blurred vision, no congestion, root canal and tooth infection CV:see HPI PUL:see HPI GI:no diarrhea constipation or melena, no indigestion GU:no hematuria, no dysuria MS:no joint pain, no claudication Neuro:no syncope, no lightheadedness Endo:no diabetes, no thyroid disease  Wt  Readings from Last 3 Encounters:  11/01/16 180 lb 1.9 oz (81.7 kg)  10/30/16 182 lb 8 oz (82.8 kg)  07/02/16 173 lb 8 oz (78.7 kg)     PHYSICAL EXAM: BP 98/80 (BP Location: Right Arm, Patient Position: Sitting, Cuff Size: Normal)   Pulse 98   Ht 5' 8.5" (1.74 m)   Wt 180 lb 1.9 oz (81.7 kg)   BMI 26.99 kg/m  Affect appropriate Healthy:  appears stated age HEENT: normal Neck supple with no adenopathy JVP normal no bruits no thyromegaly Lungs clear with no wheezing and good diaphragmatic motion Heart:  S1/S2 no murmur, no rub, gallop or click PMI normal Abdomen: benighn, BS positve, no tenderness, no AAA no bruit.  No HSM or HJR Distal pulses intact  with no bruits No edema Neuro non-focal Skin warm and dry No muscular weakness    EKG:   10/30/16  With a fib with rate 100.  No acute EKG changes.    Recent Labs: 06/25/2016: ALT 19 10/30/2016: BUN 19; Creatinine, Ser 1.23; Hemoglobin 15.5; Platelets 247.0; Potassium 4.5; Sodium 137; TSH 3.16    Lipid Panel    Component Value Date/Time   CHOL 209 (H) 06/25/2016 0833   TRIG 55.0 06/25/2016 0833   HDL 69.50 06/25/2016 0833   CHOLHDL 3 06/25/2016 0833   VLDL 11.0 06/25/2016 0833   LDLCALC 129 (H) 06/25/2016 0833   LDLDIRECT 112.7 07/02/2011 1104       Other studies Reviewed: Additional studies/ records that were reviewed today include: OV notes, labs.   ASSESSMENT AND PLAN:  1.  Afib:  Rate ok without AV nodal blocking drug. On xarelto Discussed risks and benefits of [facility] including risk of stroke Schedule [facility] for 26th with me   2. Anticoagulation on xarelto no bleeding issue normal Cr  3. Hx of rheumatic fever as a child.  No hx of problems. No murmur on exam will check echo given afib.  Will defer any stress testing until after echo / [facility] done and we know more about his heart     Current medicines are reviewed with the patient today.  The patient Has no concerns regarding medicines.  The following changes have been made:  See above Labs/ tests ordered today include:see above  Disposition:   FU:  With me post [facility]   Jenkins Rouge

## 2016-11-01 NOTE — Telephone Encounter (Signed)
Noted thank you. Will cc Maudie Mercury who did the EKG right away. Thanks!

## 2016-11-01 NOTE — Telephone Encounter (Signed)
Patient called to thank Dr.Gutierrez. Patient was very impressed with him.  Patient said thank you for catching the a-fib.

## 2016-11-02 NOTE — Telephone Encounter (Signed)
Thank you! That was a very nice message.

## 2016-11-06 ENCOUNTER — Telehealth: Payer: Self-pay | Admitting: Cardiovascular Disease

## 2016-11-06 NOTE — Telephone Encounter (Signed)
New message    Pt is calling in regards to appt last week. He checked with dentist for further instructions. Dentist says it's ok to proceed with heart procedures. He needs a call back from Rn to know if that's all that is needed from him.

## 2016-11-06 NOTE — Telephone Encounter (Signed)
The patient is calling to inform Dr. Johnsie Cancel that his dentist Tandy Gaw of [facility] states that he does not have to stop his xarelto to place his permanent cap on the 19th and that he felt there would be no bleeding issues with this procedure. The patient states that he is going to continue his xarelto so that he can have his cardioversion. Message sent to Dr. Johnsie Cancel.

## 2016-11-15 ENCOUNTER — Encounter: Payer: Self-pay | Admitting: Cardiology

## 2016-11-15 ENCOUNTER — Other Ambulatory Visit (HOSPITAL_COMMUNITY): Payer: Medicare Other

## 2016-11-16 ENCOUNTER — Other Ambulatory Visit: Payer: Medicare Other | Admitting: *Deleted

## 2016-11-16 ENCOUNTER — Other Ambulatory Visit: Payer: Self-pay

## 2016-11-16 ENCOUNTER — Ambulatory Visit (HOSPITAL_COMMUNITY): Payer: Medicare Other | Attending: Cardiology

## 2016-11-16 ENCOUNTER — Ambulatory Visit (INDEPENDENT_AMBULATORY_CARE_PROVIDER_SITE_OTHER): Payer: Medicare Other | Admitting: *Deleted

## 2016-11-16 VITALS — HR 47 | Resp 18 | Ht 68.5 in

## 2016-11-16 DIAGNOSIS — I480 Paroxysmal atrial fibrillation: Secondary | ICD-10-CM

## 2016-11-16 DIAGNOSIS — Z8249 Family history of ischemic heart disease and other diseases of the circulatory system: Secondary | ICD-10-CM | POA: Insufficient documentation

## 2016-11-16 DIAGNOSIS — I071 Rheumatic tricuspid insufficiency: Secondary | ICD-10-CM | POA: Insufficient documentation

## 2016-11-16 DIAGNOSIS — Z87891 Personal history of nicotine dependence: Secondary | ICD-10-CM | POA: Diagnosis not present

## 2016-11-16 DIAGNOSIS — I4819 Other persistent atrial fibrillation: Secondary | ICD-10-CM

## 2016-11-16 DIAGNOSIS — I481 Persistent atrial fibrillation: Secondary | ICD-10-CM | POA: Diagnosis not present

## 2016-11-16 LAB — CBC WITH DIFFERENTIAL/PLATELET
BASOS: 1 %
Basophils Absolute: 0.1 10*3/uL (ref 0.0–0.2)
EOS (ABSOLUTE): 0.2 10*3/uL (ref 0.0–0.4)
Eos: 4 %
HEMATOCRIT: 39.8 % (ref 37.5–51.0)
HEMOGLOBIN: 13.4 g/dL (ref 13.0–17.7)
IMMATURE GRANS (ABS): 0 10*3/uL (ref 0.0–0.1)
Immature Granulocytes: 0 %
LYMPHS: 30 %
Lymphocytes Absolute: 1.5 10*3/uL (ref 0.7–3.1)
MCH: 30.6 pg (ref 26.6–33.0)
MCHC: 33.7 g/dL (ref 31.5–35.7)
MCV: 91 fL (ref 79–97)
MONOCYTES: 7 %
Monocytes Absolute: 0.3 10*3/uL (ref 0.1–0.9)
NEUTROS ABS: 2.9 10*3/uL (ref 1.4–7.0)
Neutrophils: 58 %
Platelets: 211 10*3/uL (ref 150–379)
RBC: 4.38 x10E6/uL (ref 4.14–5.80)
RDW: 13.8 % (ref 12.3–15.4)
WBC: 5.1 10*3/uL (ref 3.4–10.8)

## 2016-11-16 LAB — BASIC METABOLIC PANEL
BUN / CREAT RATIO: 19 (ref 10–24)
BUN: 20 mg/dL (ref 8–27)
CALCIUM: 9.3 mg/dL (ref 8.6–10.2)
CO2: 24 mmol/L (ref 18–29)
Chloride: 103 mmol/L (ref 96–106)
Creatinine, Ser: 1.03 mg/dL (ref 0.76–1.27)
GFR, EST AFRICAN AMERICAN: 84 mL/min/{1.73_m2} (ref >59)
GFR, EST NON AFRICAN AMERICAN: 72 mL/min/{1.73_m2} (ref >59)
Glucose: 82 mg/dL (ref 65–99)
Potassium: 4.3 mmol/L (ref 3.5–5.2)
Sodium: 142 mmol/L (ref 134–144)

## 2016-11-16 LAB — PROTIME-INR
INR: 1.1 (ref 0.8–1.2)
Prothrombin Time: 11.8 s (ref 9.1–12.0)

## 2016-11-16 MED ORDER — RIVAROXABAN 20 MG PO TABS: 20.0000 mg | ORAL_TABLET | Freq: Every day | ORAL | 6 refills | Status: DC

## 2016-11-16 NOTE — Progress Notes (Signed)
1.) Reason for visit: EKG  2.) Name of MD requesting visit: Nishan/ Cecilie Kicks, NP  3.) H&P:  71- year old male patient with recent history of a-fib with RVR. Per the patient, this occurred in the setting of dental work. He is here today for an echo and EKG pending Cardioversion on 11/19/16  4.) ROS related to problem: Patient in office today for echo/ EKG (pre-DCCV on 2/26). He is without complaints today.   5.) Assessment and plan per MD: EKG reveals the patient is currently sinus brady with a rate of 47 bpm. He is only taking Xarelto for medications. 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 EKG reviewed with Dr. Johnsie Cancel. Will cancel DCCV for 2/26. He will keep his follow up with  Cecilie Kicks, NP on 11/26/16 per Dr. Johnsie Cancel. He is aware to continue xarelto at the present time.

## 2016-11-16 NOTE — Patient Instructions (Signed)
Medication Instructions: - Your physician recommends that you continue on your current medications as directed. Please refer to the Current Medication list given to you today.  Labwork: - none ordered  Procedures/Testing: - we will cancel your Cardioversion for Monday 11/19/16  Follow-Up: - as scheduled with Cecilie Kicks, NP for Dr. Johnsie Cancel  Any Additional Special Instructions Will Be Listed Below (If Applicable).     If you need a refill on your cardiac medications before your next appointment, please call your pharmacy.

## 2016-11-16 NOTE — Addendum Note (Signed)
Addended by: Eulis Foster on: 11/16/2016 07:55 AM   Modules accepted: Orders

## 2016-11-19 ENCOUNTER — Ambulatory Visit (HOSPITAL_COMMUNITY): Admit: 2016-11-19 | Payer: Medicare Other | Admitting: Cardiovascular Disease

## 2016-11-19 ENCOUNTER — Encounter (HOSPITAL_COMMUNITY): Payer: Self-pay

## 2016-11-19 SURGERY — CARDIOVERSION
Anesthesia: Monitor Anesthesia Care

## 2016-11-26 ENCOUNTER — Encounter: Payer: Self-pay | Admitting: Cardiology

## 2016-11-26 ENCOUNTER — Ambulatory Visit (INDEPENDENT_AMBULATORY_CARE_PROVIDER_SITE_OTHER): Payer: Medicare Other | Admitting: Cardiology

## 2016-11-26 VITALS — BP 114/72 | HR 60 | Ht 68.75 in | Wt 180.0 lb

## 2016-11-26 DIAGNOSIS — Z7901 Long term (current) use of anticoagulants: Secondary | ICD-10-CM

## 2016-11-26 DIAGNOSIS — Z8679 Personal history of other diseases of the circulatory system: Secondary | ICD-10-CM | POA: Diagnosis not present

## 2016-11-26 DIAGNOSIS — I480 Paroxysmal atrial fibrillation: Secondary | ICD-10-CM | POA: Diagnosis not present

## 2016-11-26 DIAGNOSIS — I481 Persistent atrial fibrillation: Secondary | ICD-10-CM

## 2016-11-26 DIAGNOSIS — I77810 Thoracic aortic ectasia: Secondary | ICD-10-CM

## 2016-11-26 DIAGNOSIS — I4819 Other persistent atrial fibrillation: Secondary | ICD-10-CM

## 2016-11-26 DIAGNOSIS — I519 Heart disease, unspecified: Secondary | ICD-10-CM | POA: Diagnosis not present

## 2016-11-26 NOTE — Patient Instructions (Signed)
Medication Instructions:  Your physician recommends that you continue on your current medications as directed. Please refer to the Current Medication list given to you today.   Labwork: None  Testing/Procedures: Your physician has requested that you have an exercise tolerance test. For further information please visit [email or web address]. Please also follow instruction sheet, as given.  Follow-Up: Your physician recommends that you schedule a follow-up appointment in 4-6 weeks with Dr. Johnsie Cancel.  Any Other Special Instructions Will Be Listed Below (If Applicable).     If you need a refill on your cardiac medications before your next appointment, please call your pharmacy.

## 2016-11-26 NOTE — Progress Notes (Signed)
Cardiology Office Note   Date:  11/26/2016   ID:  Derek [name], DOB [birth year], MRN [id number]  PCP:  [name], MD  Cardiologist:  Dr. [name]     No chief complaint on file.     History of Present Illness: Derek [name] is a 71 y.o. male who presents for post atrial fib.. We saw him 11/01/16 with A fib with RVR in PCP office of 100.  No prior cardiac hx or HTN.  He did have rheumatic fever as a child but no cardiac issues. Last echo was about 8 years ago and was told it was fine.    He had no chest pain and no SOB.  He wass fatigued.  No dizziness.    The atrial fib occurred  in the frame of 3 root canals in tooth and infection on amoxicillin.  Now off amoxicillin.  His CHA2DS2vasc score is 1 with age.  He does need permanent cap and this was placed on the 19th.   Plans were made to DCCV.  Rate was controlled on OV.   Daughter is a Psychologist, forensic.Retired from [employer]. Active. No previous cardiac disease TIA or stroke  Pt returned prior to DCCV, and was found to be in SB at 63, Dr. Johnsie Cancel was made aware and DCCV was cancelled.    Today he remains in SB at 60, no lightheadedness or Dizziness,  No chest pain or SOB.  His echo did show mild LVH and EF 45-50%.  G1 DD.  BP is controlled and HR low but stable.  With low EF will proceed with POET.     He will then follow up with Dr. Johnsie Cancel.    Past Medical History:  Diagnosis Date  . Basal cell carcinoma 2009   back  . Cancer of prostate ([place])    pT2c No Mx, Gleason 3+4=7  . Diverticulitis   . Hypogonadism male   . Kidney stones   . Left rotator cuff tear 07/10/2013  . Nephrolithiasis   . Rheumatic fever 1952-1953    Past Surgical History:  Procedure Laterality Date  . bilateral inguinal hernia repair  07/10/2012   lap BIH repairs  . COLONOSCOPY    . HERNIA REPAIR  07/10/12   LIH/rih-umb  . KNEE SURGERY  2005   Duda- arthroscopy, partial medial menisectomy-lt  . PROSTATE SURGERY  12/19/06   robotic  prostatectomy  . rotator cuff surgery  2002   Gioffre-rt  . SHOULDER ARTHROSCOPY WITH ROTATOR CUFF REPAIR AND SUBACROMIAL DECOMPRESSION Left 07/10/2013   Procedure: LEFT SHOULDER ARTHROSCOPY WITH ARTHROSCOPIC ROTATOR CUFF REPAIR AND SUBACROMIAL DECOMPRESSION, PARTIAL ACROMIOPLASTY WITH CORACROMIAL RELEASE;  Surgeon: Johnny Bridge, MD;  Location: [place];  Service: Orthopedics;  Laterality: Left;     Current Outpatient Prescriptions  Medication Sig Dispense Refill  . rivaroxaban (XARELTO) 20 MG TABS tablet Take 1 tablet (20 mg total) by mouth daily with supper. 30 tablet 6   No current facility-administered medications for this visit.     Allergies:   Patient has no known allergies.    Social History:  The patient  reports that he quit smoking about 45 years ago. He has never used smokeless tobacco. He reports that he does not drink alcohol or use drugs.   Family History:  The patient's family history includes Arrhythmia in his father; Arthritis in his other; Breast cancer in his mother; Cancer (age of onset: 75) in his brother; Lung cancer (age of onset: 31)  in his other; Prostate cancer in his other.    ROS:  General:no colds or fevers, no weight changes Skin:no rashes or ulcers HEENT:no blurred vision, no congestion CV:see HPI PUL:see HPI GI:no diarrhea constipation or melena, no indigestion GU:no hematuria, no dysuria MS:no joint pain, no claudication Neuro:no syncope, no lightheadedness Endo:no diabetes, no thyroid disease  Wt Readings from Last 3 Encounters:  11/01/16 180 lb 1.9 oz (81.7 kg)  10/30/16 182 lb 8 oz (82.8 kg)  07/02/16 173 lb 8 oz (78.7 kg)     PHYSICAL EXAM: VS:  There were no vitals taken for this visit. , BMI There is no height or weight on file to calculate BMI. General:Pleasant affect, NAD Skin:Warm and dry, brisk capillary refill HEENT:normocephalic, sclera clear, mucus membranes moist Neck:supple, no JVD, no bruits    Heart:S1S2 RRR without murmur, gallup, rub or click Lungs:clear without rales, rhonchi, or wheezes [id number], non tender, + BS, do not palpate liver spleen or masses Ext:no lower ext edema, 2+ pedal pulses, 2+ radial pulses Neuro:alert and oriented, MAE, follows commands, + facial symmetry    EKG:  EKG is ordered today. The ekg ordered today demonstrates SB at 52 no changes except HR stable.     Recent Labs: 06/25/2016: ALT 19 10/30/2016: Hemoglobin 15.5; TSH 3.16 11/16/2016: BUN 20; Creatinine, Ser 1.03; Platelets 211; Potassium 4.3; Sodium 142    Lipid Panel    Component Value Date/Time   CHOL 209 (H) 06/25/2016 0833   TRIG 55.0 06/25/2016 0833   HDL 69.50 06/25/2016 0833   CHOLHDL 3 06/25/2016 0833   VLDL 11.0 06/25/2016 0833   LDLCALC 129 (H) 06/25/2016 0833   LDLDIRECT 112.7 07/02/2011 1104       Other studies Reviewed: Additional studies/ records that were reviewed today include:  ECHO:. Study Conclusions  - Left ventricle: The cavity size was normal. Wall thickness was   increased in a pattern of mild LVH. Systolic function was mildly   reduced. The estimated ejection fraction was in the range of 45%   to 50%. Diffuse hypokinesis. Doppler parameters are consistent   with abnormal left ventricular relaxation (grade 1 diastolic   dysfunction). - Aortic valve: There was trivial regurgitation. - Ascending aorta: The ascending aorta was mildly dilated.  Impressions:  - Mild global reduction in LV systolic function; grade 1 diastolic   dysfunction; mild LVH; trace AI; mildly dilated ascending aorta;   mild TR.   ASSESSMENT AND PLAN:  1.  Post conversion spontaneous for persistent a fib.may have been due to infection of teeth and medication.  Follow up with Dr. Johnsie Cancel in 4-6 weeks.   2. anticoagulation  On xarelto no bleeding issues.  ? Length of time with Xarelto.  At least 4 weeks.  Will review with Dr. Johnsie Cancel.  Pt unsure when he converted will use date of  Echo.    3.  Echo with EF 45-50% in a fib - will do POET to rule out ishcemia, he will need repeat Echo in 2-3 months maintaining SR  4. Mildly dilated aortic root. Follow with Echoes.    Current medicines are reviewed with the patient today.  The patient Has no concerns regarding medicines.  The following changes have been made:  See above Labs/ tests ordered today include:see above  Disposition:   FU:  see above  Signed, Cecilie Kicks, NP  11/26/2016 8:24 AM    [facility] [place], [place], [place] [address]  [place], [place] Phone: [phone]; Fax: [phone]

## 2016-11-28 ENCOUNTER — Ambulatory Visit (INDEPENDENT_AMBULATORY_CARE_PROVIDER_SITE_OTHER): Payer: Medicare Other

## 2016-11-28 DIAGNOSIS — I480 Paroxysmal atrial fibrillation: Secondary | ICD-10-CM

## 2016-11-28 LAB — EXERCISE TOLERANCE TEST
CHL CUP MPHR: 148 {beats}/min
CHL CUP RESTING HR STRESS: 61 {beats}/min
CHL RATE OF PERCEIVED EXERTION: 16
CSEPEDS: 0 s
CSEPHR: 102 %
CSEPPHR: 151 {beats}/min
Estimated workload: 11.7 METS
Exercise duration (min): 10 min

## 2016-12-18 ENCOUNTER — Telehealth: Payer: Self-pay | Admitting: Cardiovascular Disease

## 2016-12-18 NOTE — Telephone Encounter (Signed)
Spoke with patient and he stated that he does not want to go to the pharmacy and pay hundreds of dollars for a medication that he may be taken off of at his upcoming office visit. He just needs a few days of samples. I informed him that I will place samples at the front desk.

## 2016-12-18 NOTE — Telephone Encounter (Signed)
Patient calling the office for samples of medication:   1.  What medication and dosage are you requesting samples for? Xarelto 20 mg 2.  Are you currently out of this medication? Will run out before next appt on 12-28-16

## 2016-12-27 NOTE — Progress Notes (Signed)
Cardiology Office Note   Date:  12/28/2016   ID:  Derek [name], DOB 09-May-1946, MRN [id number]  PCP:  [name], MD  Cardiologist:  Dr. [name]     Chief Complaint  Patient presents with  . PAF      History of Present Illness: Derek [name] is a 71 y.o. male who presents for f/u of atrial fib.. We saw him 11/01/16 had afib rates 100 in PCP office   No prior cardiac hx or HTN.  He did have rheumatic fever as a child but no cardiac issues.   The atrial fib occurred  in the frame of 3 root canals in tooth and infection on amoxicillin.  Now off amoxicillin.  His CHA2DS2vasc score is 1 with age.  Permanent cap and  was placed on the 19th.   Plans were made to DCCV.  But he converted spontaneously. .   Daughter is a Psychologist, forensic.Retired from [employer]. Active. No previous cardiac disease TIA or stroke   Seen by PA 11/26/16 still in SB at 52, no lightheadedness or Dizziness,  No chest pain or SOB.  His echo did show mild LVH and EF 45-50%.  G1 DD.  BP is controlled and HR low but stable. Given mildly decreased EF PA ordered POET  Personally reviewed. . Normal 11/28/16  Peak HR 151 BP 186/70 11.7 METS  Unfortunately back in afib today. He is unaware Showed him how to take his pulse No dyspnea chest pain or palpitations   Past Medical History:  Diagnosis Date  . Basal cell carcinoma 2009   back  . Cancer of prostate ([place])    pT2c No Mx, Gleason 3+4=7  . Diverticulitis   . Hypogonadism male   . Kidney stones   . Left rotator cuff tear 07/10/2013  . Nephrolithiasis   . Rheumatic fever 1952-1953    Past Surgical History:  Procedure Laterality Date  . bilateral inguinal hernia repair  07/10/2012   lap BIH repairs  . COLONOSCOPY    . HERNIA REPAIR  07/10/12   LIH/rih-umb  . KNEE SURGERY  2005   Duda- arthroscopy, partial medial menisectomy-lt  . PROSTATE SURGERY  12/19/06   robotic prostatectomy  . rotator cuff surgery  2002   Gioffre-rt  . SHOULDER ARTHROSCOPY  WITH ROTATOR CUFF REPAIR AND SUBACROMIAL DECOMPRESSION Left 07/10/2013   Procedure: LEFT SHOULDER ARTHROSCOPY WITH ARTHROSCOPIC ROTATOR CUFF REPAIR AND SUBACROMIAL DECOMPRESSION, PARTIAL ACROMIOPLASTY WITH CORACROMIAL RELEASE;  Surgeon: Johnny Bridge, MD;  Location: [place];  Service: Orthopedics;  Laterality: Left;     Current Outpatient Prescriptions  Medication Sig Dispense Refill  . rivaroxaban (XARELTO) 20 MG TABS tablet Take 1 tablet (20 mg total) by mouth daily with supper. 30 tablet 6   No current facility-administered medications for this visit.     Allergies:   Patient has no known allergies.    Social History:  The patient  reports that he quit smoking about 45 years ago. He has never used smokeless tobacco. He reports that he does not drink alcohol or use drugs.   Family History:  The patient's family history includes Arrhythmia in his father; Arthritis in his other; Breast cancer in his mother; Cancer (age of onset: 62) in his brother; Lung cancer (age of onset: 27) in his other; Prostate cancer in his other.    ROS:  General:no colds or fevers, no weight changes Skin:no rashes or ulcers HEENT:no blurred vision, no congestion CV:see HPI PUL:see  HPI GI:no diarrhea constipation or melena, no indigestion GU:no hematuria, no dysuria MS:no joint pain, no claudication Neuro:no syncope, no lightheadedness Endo:no diabetes, no thyroid disease  Wt Readings from Last 3 Encounters:  12/28/16 178 lb 12.8 oz (81.1 kg)  11/26/16 180 lb (81.6 kg)  11/01/16 180 lb 1.9 oz (81.7 kg)     PHYSICAL EXAM: VS:  BP 96/68   Pulse 77   Ht 5\' 10"  (1.778 m)   Wt 178 lb 12.8 oz (81.1 kg)   SpO2 98%   BMI 25.66 kg/m  , BMI Body mass index is 25.66 kg/m. General:Pleasant affect, NAD Skin:Warm and dry, brisk capillary refill HEENT:normocephalic, sclera clear, mucus membranes moist Neck:supple, no JVD, no bruits  Heart:S1S2 RRR without murmur, gallup, rub or  click Lungs:clear without rales, rhonchi, or wheezes SHF:WYOV, non tender, + BS, do not palpate liver spleen or masses Ext:no lower ext edema, 2+ pedal pulses, 2+ radial pulses Neuro:alert and oriented, MAE, follows commands, + facial symmetry    EKG:  EKG is ordered today. Afib rate 85 LAD .     Recent Labs: 06/25/2016: ALT 19 10/30/2016: Hemoglobin 15.5; TSH 3.16 11/16/2016: BUN 20; Creatinine, Ser 1.03; Platelets 211; Potassium 4.3; Sodium 142    Lipid Panel    Component Value Date/Time   CHOL 209 (H) 06/25/2016 0833   TRIG 55.0 06/25/2016 0833   HDL 69.50 06/25/2016 0833   CHOLHDL 3 06/25/2016 0833   VLDL 11.0 06/25/2016 0833   LDLCALC 129 (H) 06/25/2016 0833   LDLDIRECT 112.7 07/02/2011 1104       Other studies Reviewed: Additional studies/ records that were reviewed today include:  ECHO:. Study Conclusions  - Left ventricle: The cavity size was normal. Wall thickness was   increased in a pattern of mild LVH. Systolic function was mildly   reduced. The estimated ejection fraction was in the range of 45%   to 50%. Diffuse hypokinesis. Doppler parameters are consistent   with abnormal left ventricular relaxation (grade 1 diastolic   dysfunction). - Aortic valve: There was trivial regurgitation. - Ascending aorta: The ascending aorta was mildly dilated.  Impressions:  - Mild global reduction in LV systolic function; grade 1 diastolic   dysfunction; mild LVH; trace AI; mildly dilated ascending aorta;   mild TR.   ASSESSMENT AND PLAN:  1.  PAF with CHADVASC 1.  Start flecainide normal ETT but will need to repeat on drug EF low normal ok to start Continue xarelto Event monitor to see if he is going in / out   2.  Echo with EF 45-50% in a fib  Will repeat after restoration of SR in future   4. Mildly dilated aortic root. Follow with Echoes. Only 3.8 cm no need for CTA    [employer]

## 2016-12-28 ENCOUNTER — Ambulatory Visit (INDEPENDENT_AMBULATORY_CARE_PROVIDER_SITE_OTHER): Payer: Medicare Other | Admitting: Cardiovascular Disease

## 2016-12-28 ENCOUNTER — Encounter: Payer: Self-pay | Admitting: Cardiovascular Disease

## 2016-12-28 VITALS — BP 96/68 | HR 77 | Ht 70.0 in | Wt 178.8 lb

## 2016-12-28 DIAGNOSIS — I4819 Other persistent atrial fibrillation: Secondary | ICD-10-CM

## 2016-12-28 DIAGNOSIS — I481 Persistent atrial fibrillation: Secondary | ICD-10-CM | POA: Diagnosis not present

## 2016-12-28 MED ORDER — FLECAINIDE ACETATE 50 MG PO TABS: 50.0000 mg | ORAL_TABLET | Freq: Two times a day (BID) | ORAL | 3 refills | Status: DC

## 2016-12-28 MED ORDER — RIVAROXABAN 20 MG PO TABS: 20.0000 mg | ORAL_TABLET | Freq: Every day | ORAL | 6 refills | Status: DC

## 2016-12-28 NOTE — Patient Instructions (Addendum)
Medication Instructions:  Your physician has recommended you make the following change in your medication:  1-Flecainide 50 mg by mouth twice daily  Labwork: NONE  Testing/Procedures: Your physician has requested that you have an exercise tolerance test in 2 weeks. For further information please visit [email or web address]. Please also follow instruction sheet, as given.  Your physician has recommended that you wear an event monitor. Event monitors are medical devices that record the heart's electrical activity. Doctors most often [place] these monitors to diagnose arrhythmias. Arrhythmias are problems with the speed or rhythm of the heartbeat. The monitor is a small, portable device. You can wear one while you do your normal daily activities. This is usually used to diagnose what is causing palpitations/syncope (passing out).  Follow-Up: Your physician wants you to follow-up in: 6 to 8 weeks with Dr. Johnsie Cancel.   If you need a refill on your cardiac medications before your next appointment, please call your pharmacy.

## 2017-01-08 ENCOUNTER — Ambulatory Visit: Payer: Medicare Other | Admitting: Cardiovascular Disease

## 2017-01-10 ENCOUNTER — Ambulatory Visit (INDEPENDENT_AMBULATORY_CARE_PROVIDER_SITE_OTHER): Payer: Medicare Other

## 2017-01-10 DIAGNOSIS — I481 Persistent atrial fibrillation: Secondary | ICD-10-CM | POA: Diagnosis not present

## 2017-01-10 DIAGNOSIS — I4819 Other persistent atrial fibrillation: Secondary | ICD-10-CM

## 2017-01-10 LAB — EXERCISE TOLERANCE TEST
CHL RATE OF PERCEIVED EXERTION: 17
CSEPED: 13 min
CSEPEDS: 0 s
CSEPEW: 15.3 METS
CSEPHR: 104 %
MPHR: 148 {beats}/min
Peak HR: 155 {beats}/min
Rest HR: 60 {beats}/min

## 2017-01-14 ENCOUNTER — Ambulatory Visit (INDEPENDENT_AMBULATORY_CARE_PROVIDER_SITE_OTHER): Payer: Medicare Other | Admitting: Family Medicine

## 2017-01-14 ENCOUNTER — Telehealth: Payer: Self-pay | Admitting: Cardiovascular Disease

## 2017-01-14 ENCOUNTER — Encounter: Payer: Self-pay | Admitting: Family Medicine

## 2017-01-14 VITALS — BP 110/70 | HR 62 | Temp 98.3°F | Ht 70.0 in | Wt 180.8 lb

## 2017-01-14 DIAGNOSIS — J208 Acute bronchitis due to other specified organisms: Secondary | ICD-10-CM

## 2017-01-14 DIAGNOSIS — I480 Paroxysmal atrial fibrillation: Secondary | ICD-10-CM | POA: Diagnosis not present

## 2017-01-14 MED ORDER — DOXYCYCLINE HYCLATE 100 MG PO TABS: 100.0000 mg | ORAL_TABLET | Freq: Two times a day (BID) | ORAL | 0 refills | Status: AC

## 2017-01-14 NOTE — Progress Notes (Signed)
Dr. Frederico Hamman T. Jamea Robicheaux, MD, [facility] [place], [address] Phone: [phone] Fax: [phone]  01/14/2017  Patient: Derek [name], MRN: [id number], DOB: [birth year], 71 y.o.  Primary Physician:  [name], MD   Chief Complaint  Patient presents with  . Cough    with chest congestion/yellow phelgm  . Nasal Congestion   Subjective:   Derek [name] is a 71 y.o. very pleasant male patient who presents with the following:  Past 6 months - has not been doing well. Recent dx of A. Fib - intermittent a fib despite flecainide.   Now with symptoms in the chest and yellow phlegm x 14 days. Now up in his chest. Pressure up in his chest and around his eyes. Some sinus pressure and congestion.   November - cracked his crown. Went back. Then had to have 4 root canals.   Past Medical History, Surgical History, Social History, Family History, Problem List, Medications, and Allergies have been reviewed and updated if relevant.  Patient Active Problem List   Diagnosis Date Noted  . Atrial fibrillation ([facility]) 10/30/2016  . Left rotator cuff tear 07/10/2013  . Scrotal discoloration / purpling ?varicocele 09/08/2012  . Bilateral inguinal hernia (BIH) s/p lap repair 07/06/2012 06/24/2012  . PROSTATE CANCER, HX OF 11/10/2008  . CARCINOMA, BASAL CELL, HX OF 11/10/2008  . RHEUMATIC FEVER, HX OF 11/10/2008  . DIVERTICULITIS, HX OF 11/10/2008    Past Medical History:  Diagnosis Date  . Basal cell carcinoma 2009   back  . Cancer of prostate ([facility] [place])    pT2c No Mx, Gleason 3+4=7  . Diverticulitis   . Hypogonadism male   . Kidney stones   . Left rotator cuff tear 07/10/2013  . Nephrolithiasis   . Rheumatic fever 1952-1953    Past Surgical History:  Procedure Laterality Date  . bilateral inguinal hernia repair  07/10/2012   lap BIH repairs  . COLONOSCOPY    . HERNIA REPAIR  07/10/12   LIH/rih-umb  . KNEE SURGERY  2005     Duda- arthroscopy, partial medial menisectomy-lt  . PROSTATE SURGERY  12/19/06   robotic prostatectomy  . rotator cuff surgery  2002   Gioffre-rt  . SHOULDER ARTHROSCOPY WITH ROTATOR CUFF REPAIR AND SUBACROMIAL DECOMPRESSION Left 07/10/2013   Procedure: LEFT SHOULDER ARTHROSCOPY WITH ARTHROSCOPIC ROTATOR CUFF REPAIR AND SUBACROMIAL DECOMPRESSION, PARTIAL ACROMIOPLASTY WITH CORACROMIAL RELEASE;  Surgeon: Johnny Bridge, MD;  Location: [facility];  Service: Orthopedics;  Laterality: Left;    Social History   Social History  . Marital status: Married    Spouse name: N/A  . Number of children: N/A  . Years of education: N/A   Occupational History  . retired Licensed conveyancer) Health visitor   Social History Main Topics  . Smoking status: Former Smoker    Quit date: 09/25/1971  . Smokeless tobacco: Never Used  . Alcohol use No  . Drug use: No  . Sexual activity: Not on file   Other Topics Concern  . Not on file   Social History Narrative   Regular exercise: yes   Runner, former marathon runner    Family History  Problem Relation Age of Onset  . Breast cancer Mother   . Cancer Brother 61    brain tumor  . Heart disease      fam hx  . Arthritis Other     other relative  . Prostate cancer Other  nephew  . Lung cancer Other 78    nephew  . Arrhythmia Father     No Known Allergies  Medication list reviewed and updated in full in [facility].  ROS: GEN: Acute illness details above GI: Tolerating PO intake GU: maintaining adequate hydration and urination Pulm: No SOB Interactive and getting along well at home.  Otherwise, ROS is as per the HPI.  Objective:   BP 110/70   Pulse 62   Temp 98.3 F (36.8 C) (Oral)   Ht 5\' 10"  (1.778 m)   Wt 180 lb 12 oz (82 kg)   BMI 25.93 kg/m   GEN: A and O x 3. WDWN. NAD.    ENT: Nose clear, ext NML.  No LAD.  No JVD.  TM's clear. Oropharynx clear.  PULM: Normal WOB, no distress. No crackles,  wheezes, rhonchi. CV: RRR, no M/G/R, No rubs, No JVD.   EXT: warm and well-perfused, No c/c/e. PSYCH: Pleasant and conversant.    Laboratory and Imaging Data:  Assessment and Plan:   Acute bronchitis due to other specified organisms  Paroxysmal atrial fibrillation (HCC)  14 d of sx, pulmonary congestion and productive cough  Follow-up: No Follow-up on file.  Meds ordered this encounter  Medications  . doxycycline (VIBRA-TABS) 100 MG tablet    Sig: Take 1 tablet (100 mg total) by mouth 2 (two) times daily.    Dispense:  20 tablet    Refill:  0   Signed,  Ohanna Gassert T. Adryan Druckenmiller, MD   Allergies as of 01/14/2017   No Known Allergies     Medication List       Accurate as of 01/14/17  1:39 PM. Always use your most recent med list.          doxycycline 100 MG tablet Commonly known as:  VIBRA-TABS Take 1 tablet (100 mg total) by mouth 2 (two) times daily.   flecainide 50 MG tablet Commonly known as:  TAMBOCOR Take 1 tablet (50 mg total) by mouth 2 (two) times daily.   rivaroxaban 20 MG Tabs tablet Commonly known as:  XARELTO Take 1 tablet (20 mg total) by mouth daily with supper.

## 2017-01-14 NOTE — Telephone Encounter (Signed)
Notification received from the patient's LifeWatch monitor: 01/12/17 at 5:52 am- the patient was in a-fib with a 3 second pause. I called and spoke with the patient today.  He states he was sleeping at the time. He is currently without symptoms and cannot tell when he goes in and out of rhythm. He is on flecainide 50 mg BID and Xarelto 20 mg once daily. He is currently at his PCP's office for evaluation for possible sinus infection.   I advised the patient I would review with Dr. Johnsie Cancel, but in light of the 3 second pause occurring while he was sleeping, we will most likely just continue to monitor. He is aware I will only call back if Dr. Johnsie Cancel has further recommendations.  Reviewed with Dr. Johnsie Cancel- no further recommendations at this time.

## 2017-01-14 NOTE — Progress Notes (Signed)
Pre visit review using our clinic review tool, if applicable. No additional management support is needed unless otherwise documented below in the visit note. 

## 2017-01-21 ENCOUNTER — Ambulatory Visit: Payer: Medicare Other | Admitting: Cardiovascular Disease

## 2017-01-29 ENCOUNTER — Telehealth: Payer: Self-pay | Admitting: Cardiovascular Disease

## 2017-01-29 NOTE — Telephone Encounter (Signed)
Derek [name] with monitors, printed out patient's monitor results so far. Patient will be wearing monitor until 02-09-17. According to print out patient was in A. FIB on 01/10/17 to 01/12/17, then SB from 01/13/17 to 01/27/17. Patient went back into A. FIB on 01/28/17 and is currently in A. FIB. Patient stated he has felt extremely tired and has been worse the last couple of days. Will send to Dr. Johnsie Cancel for advisement.

## 2017-01-29 NOTE — Telephone Encounter (Signed)
Called patient about his message. Patient stated since starting Flecainide that he has been tired, weak, and dizzy (at times). Patient stated he also has blurred vision when he tries to read. Patient wondering if he needs to stay on Flecainide, or if there is any thing else he can take. Patient has life watch and is wearing monitor until 02/09/17. Will try to see about getting an up dated report on what patient is doing on monitor since starting Flecainide.

## 2017-01-29 NOTE — Telephone Encounter (Signed)
New message     Pt c/o medication issue:  1. Name of Medication:  flecainide (TAMBOCOR) 50 MG tablet Take 1 tablet (50 mg total) by mouth 2 (two) times daily.     2. How are you currently taking this medication (dosage and times per day)?   3. Are you having a reaction (difficulty breathing--STAT)?  tired 4. What is your medication issue? Does he still need to take this , it is not helping the rhythm of his heart all he is felling is really tired

## 2017-01-30 NOTE — Telephone Encounter (Signed)
Have him see EP / Afib clinic EF mildly decreased and tends to be bradycardic when in sinus Doesn't toelrate flecainide well will need another antiarrhythmic ? tikosyn

## 2017-01-30 NOTE — Telephone Encounter (Signed)
Called patient and made an appointment with A. FIB clinic.

## 2017-02-04 ENCOUNTER — Telehealth: Payer: Self-pay | Admitting: Cardiovascular Disease

## 2017-02-04 ENCOUNTER — Encounter (HOSPITAL_COMMUNITY): Payer: Self-pay | Admitting: Nurse Practitioner

## 2017-02-04 ENCOUNTER — Ambulatory Visit (HOSPITAL_COMMUNITY)
Admission: RE | Admit: 2017-02-04 | Discharge: 2017-02-04 | Disposition: A | Discharge status: Home / Self Care | Payer: Medicare Other | Source: Ambulatory Visit | Attending: Nurse Practitioner | Admitting: Nurse Practitioner

## 2017-02-04 VITALS — BP 118/82 | HR 94 | Ht 70.0 in | Wt 177.0 lb

## 2017-02-04 DIAGNOSIS — Z8546 Personal history of malignant neoplasm of prostate: Secondary | ICD-10-CM | POA: Diagnosis not present

## 2017-02-04 DIAGNOSIS — Z87891 Personal history of nicotine dependence: Secondary | ICD-10-CM | POA: Insufficient documentation

## 2017-02-04 DIAGNOSIS — I480 Paroxysmal atrial fibrillation: Secondary | ICD-10-CM | POA: Diagnosis not present

## 2017-02-04 DIAGNOSIS — Z85828 Personal history of other malignant neoplasm of skin: Secondary | ICD-10-CM | POA: Insufficient documentation

## 2017-02-04 DIAGNOSIS — Z87898 Personal history of other specified conditions: Secondary | ICD-10-CM | POA: Diagnosis not present

## 2017-02-04 DIAGNOSIS — Z7901 Long term (current) use of anticoagulants: Secondary | ICD-10-CM | POA: Diagnosis not present

## 2017-02-04 DIAGNOSIS — I4891 Unspecified atrial fibrillation: Secondary | ICD-10-CM | POA: Diagnosis present

## 2017-02-04 NOTE — Telephone Encounter (Signed)
New message       Pt c/o medication issue:  1. Name of Medication: flecainide 2. How are you currently taking this medication (dosage and times per day)?  50mg  daily  3. Are you having a reaction (difficulty breathing--STAT)?  no  4. What is your medication issue? Calling to let nurse know that pt stopped this medication last thurs.  Also, pt is wearing a monitor and he states that he got a call from the company Sunday at 3am asking if he was ok. Pt states that he is a runner and he assumed his heart rate was really low which is normal for him.  He is being seen in the AFIB clinic today.  please call before 1pm

## 2017-02-04 NOTE — Telephone Encounter (Signed)
Tried to call patient, no answer. Patient has appointment today with A. Fib clinic.

## 2017-02-05 NOTE — Progress Notes (Addendum)
Primary Care Physician: [name], MD Referring Physician: Viraj [name] is a 71 y.o. male with a h/o rheumatic fever(no structural abnormalities), and atrial fibrillation, new onset in February with v rates around 100 bpm in PCP office. Afib wa in the setting of 3 root canals with infection. He was then sent to Dr. Johnsie Cancel and plans were for DCCV but he converted spontaneously. He had return of afib in March. Echo showed EF 45-50% in afib with mildly dilated root. He was placed on xarelto with a chadsvasc score of 1. He was scheduled for a ETT and event monitor was placed which he continues to wear. He was placed on flecainide 50 mg bid on 4/2. He felt progressively worse while taking drug, with fatigue and dizziness and took himself off drug 5/8. Monitor has revealed SR with afib. Strips form 5/13 at 5:46 am showed a slow afib in the 30's with pauses up to 3.2 seconds, this was while pt was sleeping. Another slow afib epiosde with pauses was reported on 4/21 at 5:51 am.  Pt denies snoring/apnea. He is a retired Engineer, structural and enjoys running. He is disappointed that he has not been able to run for the last month with the afib condition. He does not drink, no tobacco.His daughter is a Psychologist, forensic. He does feel better off flecainide.   Today, he denies symptoms of palpitations, chest pain, shortness of breath, orthopnea, PND, lower extremity edema, dizziness, presyncope, syncope, or neurologic sequela. The patient is tolerating medications without difficulties and is otherwise without complaint today.   Past Medical History:  Diagnosis Date  . Basal cell carcinoma 2009   back  . Cancer of prostate ([facility])    pT2c No Mx, Gleason 3+4=7  . Diverticulitis   . Hypogonadism male   . Kidney stones   . Left rotator cuff tear 07/10/2013  . Nephrolithiasis   . Rheumatic fever 1952-1953   Past Surgical History:  Procedure Laterality Date  . bilateral inguinal hernia repair   07/10/2012   lap BIH repairs  . COLONOSCOPY    . HERNIA REPAIR  07/10/12   LIH/rih-umb  . KNEE SURGERY  2005   Duda- arthroscopy, partial medial menisectomy-lt  . PROSTATE SURGERY  12/19/06   robotic prostatectomy  . rotator cuff surgery  2002   Gioffre-rt  . SHOULDER ARTHROSCOPY WITH ROTATOR CUFF REPAIR AND SUBACROMIAL DECOMPRESSION Left 07/10/2013   Procedure: LEFT SHOULDER ARTHROSCOPY WITH ARTHROSCOPIC ROTATOR CUFF REPAIR AND SUBACROMIAL DECOMPRESSION, PARTIAL ACROMIOPLASTY WITH CORACROMIAL RELEASE;  Surgeon: Johnny Bridge, MD;  Location: [facility];  Service: Orthopedics;  Laterality: Left;    Current Outpatient Prescriptions  Medication Sig Dispense Refill  . rivaroxaban (XARELTO) 20 MG TABS tablet Take 1 tablet (20 mg total) by mouth daily with supper. 30 tablet 6   No current facility-administered medications for this encounter.     No Known Allergies  Social History   Social History  . Marital status: Married    Spouse name: N/A  . Number of children: N/A  . Years of education: N/A   Occupational History  . retired Licensed conveyancer) Health visitor   Social History Main Topics  . Smoking status: Former Smoker    Quit date: 09/25/1971  . Smokeless tobacco: Never Used  . Alcohol use No  . Drug use: No  . Sexual activity: Not on file   Other Topics Concern  . Not on file   Social History Narrative  Regular exercise: yes   Runner, former marathon runner    Family History  Problem Relation Age of Onset  . Breast cancer Mother   . Cancer Brother 23       brain tumor  . Heart disease Unknown        fam hx  . Arthritis Other        other relative  . Prostate cancer Other        nephew  . Lung cancer Other 11       nephew  . Arrhythmia Father     ROS- All systems are reviewed and negative except as per the HPI above  Physical Exam: Vitals:   02/04/17 1421  BP: 118/82  Pulse: 94  Weight: 177 lb (80.3 kg)  Height: 5\' 10"  (1.778  m)   Wt Readings from Last 3 Encounters:  02/04/17 177 lb (80.3 kg)  01/14/17 180 lb 12 oz (82 kg)  12/28/16 178 lb 12.8 oz (81.1 kg)    Labs: Lab Results  Component Value Date   NA 142 11/16/2016   K 4.3 11/16/2016   CL 103 11/16/2016   CO2 24 11/16/2016   GLUCOSE 82 11/16/2016   BUN 20 11/16/2016   CREATININE 1.03 11/16/2016   CALCIUM 9.3 11/16/2016   Lab Results  Component Value Date   INR 1.1 11/16/2016   Lab Results  Component Value Date   CHOL 209 (H) 06/25/2016   HDL 69.50 06/25/2016   LDLCALC 129 (H) 06/25/2016   TRIG 55.0 06/25/2016     GEN- The patient is well appearing, alert and oriented x 3 today.   Head- normocephalic, atraumatic Eyes-  Sclera clear, conjunctiva pink Ears- hearing intact Oropharynx- clear Neck- supple, no JVP Lymph- no cervical lymphadenopathy Lungs- Clear to ausculation bilaterally, normal work of breathing Heart- irregular rate and rhythm, no murmurs, rubs or gallops, PMI not laterally displaced GI- soft, NT, ND, + BS Extremities- no clubbing, cyanosis, or edema MS- no significant deformity or atrophy Skin- no rash or lesion Psych- euthymic mood, full affect Neuro- strength and sensation are intact  EKG-afib at 94 bpm, qrs int 80 ms, qtc 435 ms Echo-Study Conclusions  - Left ventricle: The cavity size was normal. Wall thickness was   increased in a pattern of mild LVH. Systolic function was mildly   reduced. The estimated ejection fraction was in the range of 45%   to 50%. Diffuse hypokinesis. Doppler parameters are consistent   with abnormal left ventricular relaxation (grade 1 diastolic   dysfunction). - Aortic valve: There was trivial regurgitation. - Ascending aorta: The ascending aorta was mildly dilated.  Impressions:  - Mild global reduction in LV systolic function; grade 1 diastolic   dysfunction; mild LVH; trace AI; mildly dilated ascending aorta;   mild TR.  Epic records reviewed Event monitor findings  up to date were also reviewed    Assessment and Plan:  1. New onset paroxysmal afib 2/18 Did not tolerate flecainide and is off drug since 5/8 His is appropriately on xarelto 20 mg daily for a chadsvasc score of 1 I am concerned re his slow afib with v rates in the 30's(mostly at night) with pauses and will not start any further AAD or rate control at this time I will request an appointment with Dr. [name] to discuss ablation This would fit best with pt's lifestyle as he wishes to return to running and does not care for long term AAD therapy   Butch Penny C. Kayleen Memos,  [facility] Afib [facility] [address] [place], [place] [address] [phone]

## 2017-02-11 ENCOUNTER — Telehealth: Payer: Self-pay

## 2017-02-11 ENCOUNTER — Telehealth: Payer: Self-pay | Admitting: Cardiovascular Disease

## 2017-02-11 ENCOUNTER — Encounter: Payer: Self-pay | Admitting: Internal Medicine

## 2017-02-11 ENCOUNTER — Ambulatory Visit (INDEPENDENT_AMBULATORY_CARE_PROVIDER_SITE_OTHER): Payer: Medicare Other | Admitting: Internal Medicine

## 2017-02-11 VITALS — BP 116/70 | HR 56 | Ht 70.0 in | Wt 178.4 lb

## 2017-02-11 DIAGNOSIS — I480 Paroxysmal atrial fibrillation: Secondary | ICD-10-CM | POA: Diagnosis not present

## 2017-02-11 LAB — CBC WITH DIFFERENTIAL/PLATELET
BASOS ABS: 0 10*3/uL (ref 0.0–0.2)
Basos: 1 %
EOS (ABSOLUTE): 0.1 10*3/uL (ref 0.0–0.4)
Eos: 2 %
HEMOGLOBIN: 13.6 g/dL (ref 13.0–17.7)
Hematocrit: 39.1 % (ref 37.5–51.0)
Immature Grans (Abs): 0 10*3/uL (ref 0.0–0.1)
Immature Granulocytes: 0 %
LYMPHS ABS: 1.4 10*3/uL (ref 0.7–3.1)
Lymphs: 28 %
MCH: 31.3 pg (ref 26.6–33.0)
MCHC: 34.8 g/dL (ref 31.5–35.7)
MCV: 90 fL (ref 79–97)
MONOCYTES: 8 %
Monocytes Absolute: 0.4 10*3/uL (ref 0.1–0.9)
NEUTROS ABS: 3 10*3/uL (ref 1.4–7.0)
Neutrophils: 61 %
Platelets: 205 10*3/uL (ref 150–379)
RBC: 4.34 x10E6/uL (ref 4.14–5.80)
RDW: 13.4 % (ref 12.3–15.4)
WBC: 5 10*3/uL (ref 3.4–10.8)

## 2017-02-11 LAB — BASIC METABOLIC PANEL
BUN/Creatinine Ratio: 19 (ref 10–24)
BUN: 20 mg/dL (ref 8–27)
CHLORIDE: 101 mmol/L (ref 96–106)
CO2: 25 mmol/L (ref 18–29)
CREATININE: 1.04 mg/dL (ref 0.76–1.27)
Calcium: 9.8 mg/dL (ref 8.6–10.2)
GFR calc Af Amer: 82 mL/min/{1.73_m2} (ref >59)
GFR calc non Af Amer: 71 mL/min/{1.73_m2} (ref >59)
Glucose: 86 mg/dL (ref 65–99)
Potassium: 5 mmol/L (ref 3.5–5.2)
SODIUM: 140 mmol/L (ref 134–144)

## 2017-02-11 NOTE — Telephone Encounter (Signed)
Left message for patient to call back  

## 2017-02-11 NOTE — Patient Instructions (Addendum)
Medication Instructions:  Your physician recommends that you continue on your current medications as directed. Please refer to the Current Medication list given to you today.   Labwork: Your physician recommends that you return for lab work today: BMP/CBC   Testing/Procedures:   Your physician has requested that you have cardiac CT. Cardiac computed tomography (CT) is a painless test that uses an x-ray machine to take clear, detailed pictures of your heart. For further information please visit [facility]. Please follow instruction sheet as given.-- will be week of 02/18/17.  The office will call once set up and give you instructions   Your physician has recommended that you have an ablation. Catheter ablation is a medical procedure used to treat some cardiac arrhythmias (irregular heartbeats). During catheter ablation, a long, thin, flexible tube is put into a blood vessel in your groin (upper thigh), or neck. This tube is called an ablation catheter. It is then guided to your heart through the blood vessel. Radio frequency waves destroy small areas of heart tissue where abnormal heartbeats may cause an arrhythmia to start. Please see the instruction sheet given to you today.---02/26/17  Please arrive at [facility] at 5:30am Do not eat or drink after midnight the night prior to the procedure Do not take any medications the morning of the procedure Plan for one night stay Will need someone to drive you home at discharge   Follow-Up: Your physician recommends that you schedule a follow-up appointment in: 4 weeks from 02/26/17 with [name], NP and 3 months from 02/26/17 with Dr [name]   Any Other Special Instructions Will Be Listed Below (If Applicable).     If you need a refill on your cardiac medications before your next appointment, please call your pharmacy.

## 2017-02-11 NOTE — Telephone Encounter (Signed)
Patient calling, states that he would like to verify if he still needs to keep the appt on 03-06-17 or not? Patient states that this appt was made prior to all the tests and procedures scheduled.

## 2017-02-11 NOTE — Telephone Encounter (Signed)
Received [facility] telemetry event report - episode date - 02/09/17 at 6:23 AM (EDT). Preliminary Findings: A-fib; pauses 3.1 seconds. Pt had appt with Dr. [name] today at 8:45 AM. Reviewed event report with Dr. [name]. Pt is scheduled for a-fib ablation on 02/26/17.

## 2017-02-11 NOTE — Progress Notes (Signed)
Electrophysiology Office Note   Date:  02/11/2017   ID:  Derek [name], DOB [birth year], MRN [id number]  PCP:  [name], MD  Cardiologist:  Dr Johnsie Cancel Primary Electrophysiologist: Thompson Grayer, MD    Chief Complaint  Patient presents with  . Atrial Fibrillation     History of Present Illness: Derek [name] is a 71 y.o. male who presents today for electrophysiology evaluation.   The patient has symptomatic recurrent paroxysmal atrial fibrillation.  He is referred by Butch Penny in the AF clinic for further evaluation and management of atrial fibrillation.  His afib was initially diagnosed 1/18 after root canal procedure.  He has had intermittent afib since that time.  He failed medical therapy with flecainide (due to intolerance).  He had fatigue and dizziness with this medicine.  He also has chronic bradycardia which limits medical therapy.  He is an avid runner.  He has a h/o rheumatic fever as a child but without known sequela.  During afib, he has decreased exercise tolerance and SOB.  Today, he denies symptoms of palpitations, chest pain, shortness of breath, orthopnea, PND, lower extremity edema, claudication, dizziness, presyncope, syncope, bleeding, or neurologic sequela. The patient is tolerating medications without difficulties and is otherwise without complaint today.    Past Medical History:  Diagnosis Date  . Basal cell carcinoma 2009   back  . Cancer of prostate ([facility])    pT2c No Mx, Gleason 3+4=7  . Chronic systolic dysfunction of left ventricle    EF 45% by echo 2/18  . Diverticulitis   . Hypogonadism male   . Kidney stones   . Left rotator cuff tear 07/10/2013  . Nephrolithiasis   . Paroxysmal atrial fibrillation (H[facility])   . Rheumatic fever 1952-1953   Past Surgical History:  Procedure Laterality Date  . bilateral inguinal hernia repair  07/10/2012   lap BIH repairs  . COLONOSCOPY    . HERNIA REPAIR  07/10/12   LIH/rih-umb  . KNEE SURGERY  2005   Duda-  arthroscopy, partial medial menisectomy-lt  . PROSTATE SURGERY  12/19/06   robotic prostatectomy  . rotator cuff surgery  2002   Gioffre-rt  . SHOULDER ARTHROSCOPY WITH ROTATOR CUFF REPAIR AND SUBACROMIAL DECOMPRESSION Left 07/10/2013   Procedure: LEFT SHOULDER ARTHROSCOPY WITH ARTHROSCOPIC ROTATOR CUFF REPAIR AND SUBACROMIAL DECOMPRESSION, PARTIAL ACROMIOPLASTY WITH CORACROMIAL RELEASE;  Surgeon: Johnny Bridge, MD;  Location: [facility];  Service: Orthopedics;  Laterality: Left;     Current Outpatient Prescriptions  Medication Sig Dispense Refill  . rivaroxaban (XARELTO) 20 MG TABS tablet Take 1 tablet (20 mg total) by mouth daily with supper. 30 tablet 6   No current facility-administered medications for this visit.     Allergies:   Flecainide   Social History:  The patient  reports that he quit smoking about 45 years ago. He has never used smokeless tobacco. He reports that he does not drink alcohol or use drugs.   Family History:  The patient's  family history includes Arrhythmia in his father; Arthritis in his other; Breast cancer in his mother; Cancer (age of onset: 58) in his brother; Lung cancer (age of onset: 81) in his other; Prostate cancer in his other.    ROS:  Please see the history of present illness.   All other systems are personally reviewed and negative.    PHYSICAL EXAM: VS:  BP 116/70   Pulse (!) 56   Ht 5\' 10"  (1.778 m)   Wt 178 lb  6.4 oz (80.9 kg)   SpO2 99%   BMI 25.60 kg/m  , BMI Body mass index is 25.6 kg/m. GEN: Well nourished, well developed, in no acute distress  HEENT: normal  Neck: no JVD, carotid bruits, or masses Cardiac: RRR; no murmurs, rubs, or gallops,no edema  Respiratory:  clear to auscultation bilaterally, normal work of breathing GI: soft, nontender, nondistended, + BS MS: no deformity or atrophy  Skin: warm and dry  Neuro:  Strength and sensation are intact Psych: euthymic mood, full affect  EKG:  EKG is ordered  today. The ekg ordered today is personally reviewed and shows sinus bradycardia 56 bpm, Qtc 416 msec   Recent Labs: 06/25/2016: ALT 19 10/30/2016: Hemoglobin 15.5; TSH 3.16 11/16/2016: BUN 20; Creatinine, Ser 1.03; Platelets 211; Potassium 4.3; Sodium 142  personally reviewed   Lipid Panel     Component Value Date/Time   CHOL 209 (H) 06/25/2016 0833   TRIG 55.0 06/25/2016 0833   HDL 69.50 06/25/2016 0833   CHOLHDL 3 06/25/2016 0833   VLDL 11.0 06/25/2016 0833   LDLCALC 129 (H) 06/25/2016 0833   LDLDIRECT 112.7 07/02/2011 1104   personally reviewed   Wt Readings from Last 3 Encounters:  02/11/17 178 lb 6.4 oz (80.9 kg)  02/04/17 177 lb (80.3 kg)  01/14/17 180 lb 12 oz (82 kg)      Other studies personally reviewed: Additional studies/ records that were reviewed today include: AF clinic notes, prior echo  Review of the above records today demonstrates: as above   ASSESSMENT AND PLAN:  1.  Paroxysmal atrial fibrillation The patient has symptomatic afib. He has failed medical therapy with flecainide Further therapy has been limited by bradycardia. Therapeutic strategies for afib including medicine (tikosyn) and ablation were discussed in detail with the patient today. Risk, benefits, and alternatives to EP study and radiofrequency ablation for afib were also discussed in detail today. These risks include but are not limited to stroke, bleeding, vascular damage, tamponade, perforation, damage to the esophagus, lungs, and other structures, pulmonary vein stenosis, worsening renal function, and death. The patient understands these risk and wishes to proceed.  We will therefore proceed with catheter ablation at the next available time.  Cardiac CT is planned prior to ablation to exclude LAA thrombus His chads2vasc score is 1-2 (EF 45%).  His daughter works for [employer].  He would like to continue xarelto at this time.    Current medicines are reviewed at length with the patient today.    The patient does not have concerns regarding his medicines.  The following changes were made today:  none   Signed, Thompson Grayer, MD  02/11/2017 9:53 AM     [facility] [address] [place] [address] [phone] (office) [phone] (fax)

## 2017-02-11 NOTE — Telephone Encounter (Signed)
Informed patient that he would not need his appointment with Dr. Johnsie Cancel in June. Patient has several appointments made between Ablation procedure, A. FIB clinic, and Dr. [name] in the next several months. Informed patient that after his appointment in September with Dr. [name], that it will be determined when he needs to follow-up with Dr. Johnsie Cancel. Patient verbalized understanding.

## 2017-02-19 ENCOUNTER — Ambulatory Visit (HOSPITAL_COMMUNITY)
Admission: RE | Admit: 2017-02-19 | Discharge: 2017-02-19 | Disposition: A | Discharge status: Home / Self Care | Payer: Medicare Other | Source: Ambulatory Visit | Attending: Internal Medicine | Admitting: Internal Medicine

## 2017-02-19 DIAGNOSIS — R918 Other nonspecific abnormal finding of lung field: Secondary | ICD-10-CM | POA: Diagnosis not present

## 2017-02-19 DIAGNOSIS — I4891 Unspecified atrial fibrillation: Secondary | ICD-10-CM | POA: Diagnosis not present

## 2017-02-19 DIAGNOSIS — I480 Paroxysmal atrial fibrillation: Secondary | ICD-10-CM | POA: Insufficient documentation

## 2017-02-19 DIAGNOSIS — I700 Atherosclerosis of aorta: Secondary | ICD-10-CM | POA: Insufficient documentation

## 2017-02-19 MED ORDER — IOPAMIDOL (ISOVUE-370) INJECTION 76%
INTRAVENOUS | Status: AC
  Administered 2017-02-19: 80 mL via INTRAVENOUS
  Filled 2017-02-19: qty 100

## 2017-02-25 ENCOUNTER — Encounter (HOSPITAL_COMMUNITY): Payer: Self-pay | Admitting: Anesthesiology

## 2017-02-25 NOTE — Anesthesia Preprocedure Evaluation (Addendum)
Anesthesia Evaluation  Patient identified by MRN, date of birth, ID band Patient awake    Reviewed: Allergy & Precautions, NPO status , Patient's Chart, lab work & pertinent test results, reviewed documented beta blocker date and time   Airway Mallampati: II  TM Distance: >3 FB Neck ROM: Full    Dental no notable dental hx. (+) Teeth Intact, Caps   Pulmonary former smoker,    Pulmonary exam normal breath sounds clear to auscultation       Cardiovascular Normal cardiovascular exam+ dysrhythmias Atrial Fibrillation  Rhythm:Regular Rate:Normal  Paroxysmal a. Fib Chronic systolic dysfunction- global hypokinesis LVEF 45-50%   Neuro/Psych negative neurological ROS  negative psych ROS   GI/Hepatic negative GI ROS, Neg liver ROS,   Endo/Other  negative endocrine ROS  Renal/GU Renal diseaseHx/o renal calculi    Hx/o prostate ca     Musculoskeletal Hx/o rotator cuff tear left shoulder    Abdominal   Peds  Hematology xarelto use - last dose   Anesthesia Other Findings   Reproductive/Obstetrics                          Anesthesia Physical Anesthesia Plan  ASA: III  Anesthesia Plan: General   Post-op Pain Management:    Induction: Intravenous  PONV Risk Score and Plan: 1 and 2 and Ondansetron, Treatment may vary due to age and Propofol  Airway Management Planned: LMA  Additional Equipment:   Intra-op Plan:   Post-operative Plan: Extubation in OR  Informed Consent: I have reviewed the patients History and Physical, chart, labs and discussed the procedure including the risks, benefits and alternatives for the proposed anesthesia with the patient or authorized representative who has indicated his/her understanding and acceptance.   Dental advisory given  Plan Discussed with: Anesthesiologist, CRNA and Surgeon  Anesthesia Plan Comments:        Anesthesia Quick Evaluation

## 2017-02-26 ENCOUNTER — Ambulatory Visit (HOSPITAL_COMMUNITY)
Admission: RE | Admit: 2017-02-26 | Discharge: 2017-02-27 | Disposition: A | Discharge status: Home / Self Care | Payer: Medicare Other | Source: Ambulatory Visit | Attending: Internal Medicine | Admitting: Internal Medicine

## 2017-02-26 ENCOUNTER — Ambulatory Visit (HOSPITAL_COMMUNITY): Payer: Medicare Other | Admitting: Anesthesiology

## 2017-02-26 ENCOUNTER — Encounter (HOSPITAL_COMMUNITY)
Admission: RE | Disposition: A | Discharge status: Home / Self Care | Payer: Self-pay | Source: Ambulatory Visit | Attending: Internal Medicine

## 2017-02-26 ENCOUNTER — Encounter (HOSPITAL_COMMUNITY): Payer: Self-pay

## 2017-02-26 DIAGNOSIS — I4891 Unspecified atrial fibrillation: Secondary | ICD-10-CM | POA: Diagnosis not present

## 2017-02-26 DIAGNOSIS — Z7901 Long term (current) use of anticoagulants: Secondary | ICD-10-CM | POA: Diagnosis not present

## 2017-02-26 DIAGNOSIS — Z87891 Personal history of nicotine dependence: Secondary | ICD-10-CM | POA: Diagnosis not present

## 2017-02-26 DIAGNOSIS — I480 Paroxysmal atrial fibrillation: Secondary | ICD-10-CM | POA: Insufficient documentation

## 2017-02-26 DIAGNOSIS — I5022 Chronic systolic (congestive) heart failure: Secondary | ICD-10-CM | POA: Insufficient documentation

## 2017-02-26 HISTORY — PX: ABLATION OF DYSRHYTHMIC FOCUS: SHX254

## 2017-02-26 HISTORY — PX: ATRIAL FIBRILLATION ABLATION: EP1191

## 2017-02-26 LAB — POCT ACTIVATED CLOTTING TIME: ACTIVATED CLOTTING TIME: 169 s

## 2017-02-26 SURGERY — ATRIAL FIBRILLATION ABLATION
Anesthesia: General

## 2017-02-26 MED ORDER — BUPIVACAINE HCL (PF) 0.25 % IJ SOLN
INTRAMUSCULAR | Status: AC
  Filled 2017-02-26: qty 30

## 2017-02-26 MED ORDER — PROPOFOL 10 MG/ML IV BOLUS
INTRAVENOUS | Status: DC | PRN
Start: 2017-02-26 — End: 2017-02-26
  Administered 2017-02-26: 150 mg via INTRAVENOUS

## 2017-02-26 MED ORDER — PROMETHAZINE HCL 25 MG/ML IJ SOLN: 6.2500 mg | INTRAMUSCULAR | Status: DC | PRN

## 2017-02-26 MED ORDER — HYDROCODONE-ACETAMINOPHEN 5-325 MG PO TABS: 1.0000 | ORAL_TABLET | ORAL | Status: DC | PRN

## 2017-02-26 MED ORDER — HEPARIN SODIUM (PORCINE) 1000 UNIT/ML IJ SOLN
INTRAMUSCULAR | Status: DC | PRN
  Administered 2017-02-26 (×2): 1000 [IU] via INTRAVENOUS
  Administered 2017-02-26: 12000 [IU] via INTRAVENOUS

## 2017-02-26 MED ORDER — HEPARIN SODIUM (PORCINE) 1000 UNIT/ML IJ SOLN
INTRAMUSCULAR | Status: AC
  Filled 2017-02-26: qty 1

## 2017-02-26 MED ORDER — ACETAMINOPHEN 325 MG PO TABS: 650.0000 mg | ORAL_TABLET | ORAL | Status: DC | PRN

## 2017-02-26 MED ORDER — SODIUM CHLORIDE 0.9 % IV SOLN
INTRAVENOUS | Status: DC
  Administered 2017-02-26: 06:00:00 via INTRAVENOUS

## 2017-02-26 MED ORDER — IOPAMIDOL (ISOVUE-370) INJECTION 76%
INTRAVENOUS | Status: AC
  Filled 2017-02-26: qty 50

## 2017-02-26 MED ORDER — FENTANYL CITRATE (PF) 100 MCG/2ML IJ SOLN: 25.0000 ug | INTRAMUSCULAR | Status: DC | PRN

## 2017-02-26 MED ORDER — LIDOCAINE HCL (CARDIAC) 20 MG/ML IV SOLN
INTRAVENOUS | Status: DC | PRN
  Administered 2017-02-26: 60 mg via INTRAVENOUS

## 2017-02-26 MED ORDER — MIDAZOLAM HCL 5 MG/5ML IJ SOLN
INTRAMUSCULAR | Status: DC | PRN
  Administered 2017-02-26: 1 mg via INTRAVENOUS

## 2017-02-26 MED ORDER — ONDANSETRON HCL 4 MG/2ML IJ SOLN: 4.0000 mg | Freq: Four times a day (QID) | INTRAMUSCULAR | Status: DC | PRN

## 2017-02-26 MED ORDER — ISOPROTERENOL HCL 0.2 MG/ML IJ SOLN
INTRAMUSCULAR | Status: AC
  Filled 2017-02-26: qty 5

## 2017-02-26 MED ORDER — FENTANYL CITRATE (PF) 100 MCG/2ML IJ SOLN
INTRAMUSCULAR | Status: DC | PRN
  Administered 2017-02-26: 50 ug via INTRAVENOUS
  Administered 2017-02-26: 25 ug via INTRAVENOUS

## 2017-02-26 MED ORDER — HEPARIN SODIUM (PORCINE) 1000 UNIT/ML IJ SOLN
INTRAMUSCULAR | Status: DC | PRN
  Administered 2017-02-26: 2000 [IU] via INTRAVENOUS
  Administered 2017-02-26: 4000 [IU] via INTRAVENOUS
  Administered 2017-02-26: 1000 [IU] via INTRAVENOUS

## 2017-02-26 MED ORDER — SODIUM CHLORIDE 0.9 % IV SOLN: 250.0000 mL | INTRAVENOUS | Status: DC | PRN

## 2017-02-26 MED ORDER — EPHEDRINE SULFATE 50 MG/ML IJ SOLN
INTRAMUSCULAR | Status: DC | PRN
  Administered 2017-02-26: 5 mg via INTRAVENOUS
  Administered 2017-02-26: 10 mg via INTRAVENOUS
  Administered 2017-02-26: 15 mg via INTRAVENOUS
  Administered 2017-02-26: 20 mg via INTRAVENOUS

## 2017-02-26 MED ORDER — SODIUM CHLORIDE 0.9% FLUSH: 3.0000 mL | Freq: Two times a day (BID) | INTRAVENOUS | Status: DC

## 2017-02-26 MED ORDER — IOPAMIDOL (ISOVUE-370) INJECTION 76%
INTRAVENOUS | Status: DC | PRN
  Administered 2017-02-26: 3 mL

## 2017-02-26 MED ORDER — LACTATED RINGERS IV SOLN
INTRAVENOUS | Status: DC | PRN
  Administered 2017-02-26: 07:00:00 via INTRAVENOUS

## 2017-02-26 MED ORDER — RIVAROXABAN 20 MG PO TABS
20.0000 mg | ORAL_TABLET | Freq: Every day | ORAL | Status: DC
  Administered 2017-02-26: 20 mg via ORAL
  Filled 2017-02-26: qty 1

## 2017-02-26 MED ORDER — SODIUM CHLORIDE 0.9% FLUSH: 3.0000 mL | INTRAVENOUS | Status: DC | PRN

## 2017-02-26 MED ORDER — ONDANSETRON HCL 4 MG/2ML IJ SOLN
INTRAMUSCULAR | Status: DC | PRN
  Administered 2017-02-26: 4 mg via INTRAVENOUS

## 2017-02-26 MED ORDER — ISOPROTERENOL HCL 0.2 MG/ML IJ SOLN
INTRAMUSCULAR | Status: DC | PRN
  Administered 2017-02-26: 20 ug/min via INTRAVENOUS

## 2017-02-26 MED ORDER — BUPIVACAINE HCL (PF) 0.25 % IJ SOLN
INTRAMUSCULAR | Status: DC | PRN
Start: 2017-02-26 — End: 2017-02-26
  Administered 2017-02-26: 20 mL

## 2017-02-26 MED ORDER — PHENYLEPHRINE HCL 10 MG/ML IJ SOLN
INTRAVENOUS | Status: DC | PRN
  Administered 2017-02-26: 20 ug/min via INTRAVENOUS

## 2017-02-26 SURGICAL SUPPLY — 18 items
BAG SNAP BAND KOVER 36X36 (MISCELLANEOUS) ×3 IMPLANT
BLANKET WARM UNDERBOD FULL ACC (MISCELLANEOUS) ×3 IMPLANT
CATH NAVISTAR SMARTTOUCH DF (ABLATOR) ×2 IMPLANT
CATH SOUNDSTAR ECO REPROCESSED (CATHETERS) ×2 IMPLANT
CATH VARIABLE LASSO NAV 2515 (CATHETERS) ×2 IMPLANT
CATH WEBSTER BI DIR CS D-F CRV (CATHETERS) ×2 IMPLANT
COVER SWIFTLINK CONNECTOR (BAG) ×3 IMPLANT
NDL TRANSEP BRK 71CM 407200 (NEEDLE) IMPLANT
NEEDLE TRANSEP BRK 71CM 407200 (NEEDLE) ×3 IMPLANT
PACK EP LATEX FREE (CUSTOM PROCEDURE TRAY) ×3
PACK EP LF (CUSTOM PROCEDURE TRAY) ×1 IMPLANT
PAD DEFIB LIFELINK (PAD) ×3 IMPLANT
PATCH CARTO3 (PAD) ×2 IMPLANT
SHEATH AVANTI 11F 11CM (SHEATH) ×2 IMPLANT
SHEATH PINNACLE 7F 10CM (SHEATH) ×4 IMPLANT
SHEATH PINNACLE 9F 10CM (SHEATH) ×2 IMPLANT
SHEATH SWARTZ TS SL2 63CM 8.5F (SHEATH) ×2 IMPLANT
TUBING SMART ABLATE COOLFLOW (TUBING) ×2 IMPLANT

## 2017-02-26 NOTE — Transfer of Care (Signed)
Immediate Anesthesia Transfer of Care Note  Patient: Derek [name]  Procedure(s) Performed: Procedure(s): Atrial Fibrillation Ablation (N/A)  Patient Location: PACU  Anesthesia Type:General  Level of Consciousness: awake and alert   Airway & Oxygen Therapy: Patient Spontanous Breathing and Patient connected to nasal cannula oxygen  Post-op Assessment: Report given to RN and Post -op Vital signs reviewed and stable  Post vital signs: Reviewed and stable  Last Vitals:  Vitals:   02/26/17 0537  BP: 105/76  Pulse: (!) 53  Temp: 36.6 C    Last Pain:  Vitals:   02/26/17 0537  TempSrc: Oral      Patients Stated Pain Goal: 4 (24/93/24 1991)  Complications: No apparent anesthesia complications

## 2017-02-26 NOTE — Progress Notes (Signed)
To floor RN,Tele. RFV site unremarkable. Transferred in stable RSR

## 2017-02-26 NOTE — H&P (View-Only) (Signed)
Electrophysiology Office Note   Date:  02/11/2017   ID:  Derek [name], DOB 04-Jul-1946, MRN [id number]  PCP:  [name], MD  Cardiologist:  Dr Johnsie Cancel Primary Electrophysiologist: Thompson Grayer, MD    Chief Complaint  Patient presents with  . Atrial Fibrillation     History of Present Illness: Derek [name] is a 71 y.o. male who presents today for electrophysiology evaluation.   The patient has symptomatic recurrent paroxysmal atrial fibrillation.  He is referred by Butch Penny in the AF clinic for further evaluation and management of atrial fibrillation.  His afib was initially diagnosed 1/18 after root canal procedure.  He has had intermittent afib since that time.  He failed medical therapy with flecainide (due to intolerance).  He had fatigue and dizziness with this medicine.  He also has chronic bradycardia which limits medical therapy.  He is an avid runner.  He has a h/o rheumatic fever as a child but without known sequela.  During afib, he has decreased exercise tolerance and SOB.  Today, he denies symptoms of palpitations, chest pain, shortness of breath, orthopnea, PND, lower extremity edema, claudication, dizziness, presyncope, syncope, bleeding, or neurologic sequela. The patient is tolerating medications without difficulties and is otherwise without complaint today.    Past Medical History:  Diagnosis Date  . Basal cell carcinoma 2009   back  . Cancer of prostate ([facility])    pT2c No Mx, Gleason 3+4=7  . Chronic systolic dysfunction of left ventricle    EF 45% by echo 2/18  . Diverticulitis   . Hypogonadism male   . Kidney stones   . Left rotator cuff tear 07/10/2013  . Nephrolithiasis   . Paroxysmal atrial fibrillation ([facility])   . Rheumatic fever 1952-1953   Past Surgical History:  Procedure Laterality Date  . bilateral inguinal hernia repair  07/10/2012   lap BIH repairs  . COLONOSCOPY    . HERNIA REPAIR  07/10/12   LIH/rih-umb  . KNEE SURGERY  2005   Duda-  arthroscopy, partial medial menisectomy-lt  . PROSTATE SURGERY  12/19/06   robotic prostatectomy  . rotator cuff surgery  2002   Gioffre-rt  . SHOULDER ARTHROSCOPY WITH ROTATOR CUFF REPAIR AND SUBACROMIAL DECOMPRESSION Left 07/10/2013   Procedure: LEFT SHOULDER ARTHROSCOPY WITH ARTHROSCOPIC ROTATOR CUFF REPAIR AND SUBACROMIAL DECOMPRESSION, PARTIAL ACROMIOPLASTY WITH CORACROMIAL RELEASE;  Surgeon: Johnny Bridge, MD;  Location: [facility];  Service: Orthopedics;  Laterality: Left;     Current Outpatient Prescriptions  Medication Sig Dispense Refill  . rivaroxaban (XARELTO) 20 MG TABS tablet Take 1 tablet (20 mg total) by mouth daily with supper. 30 tablet 6   No current facility-administered medications for this visit.     Allergies:   Flecainide   Social History:  The patient  reports that he quit smoking about 45 years ago. He has never used smokeless tobacco. He reports that he does not drink alcohol or use drugs.   Family History:  The patient's  family history includes Arrhythmia in his father; Arthritis in his other; Breast cancer in his mother; Cancer (age of onset: 87) in his brother; Lung cancer (age of onset: 23) in his other; Prostate cancer in his other.    ROS:  Please see the history of present illness.   All other systems are personally reviewed and negative.    PHYSICAL EXAM: VS:  BP 116/70   Pulse (!) 56   Ht 5\' 10"  (1.778 m)   Wt 178 lb  6.4 oz (80.9 kg)   SpO2 99%   BMI 25.60 kg/m  , BMI Body mass index is 25.6 kg/m. GEN: Well nourished, well developed, in no acute distress  HEENT: normal  Neck: no JVD, carotid bruits, or masses Cardiac: RRR; no murmurs, rubs, or gallops,no edema  Respiratory:  clear to auscultation bilaterally, normal work of breathing GI: soft, nontender, nondistended, + BS MS: no deformity or atrophy  Skin: warm and dry  Neuro:  Strength and sensation are intact Psych: euthymic mood, full affect  EKG:  EKG is ordered  today. The ekg ordered today is personally reviewed and shows sinus bradycardia 56 bpm, Qtc 416 msec   Recent Labs: 06/25/2016: ALT 19 10/30/2016: Hemoglobin 15.5; TSH 3.16 11/16/2016: BUN 20; Creatinine, Ser 1.03; Platelets 211; Potassium 4.3; Sodium 142  personally reviewed   Lipid Panel     Component Value Date/Time   CHOL 209 (H) 06/25/2016 0833   TRIG 55.0 06/25/2016 0833   HDL 69.50 06/25/2016 0833   CHOLHDL 3 06/25/2016 0833   VLDL 11.0 06/25/2016 0833   LDLCALC 129 (H) 06/25/2016 0833   LDLDIRECT 112.7 07/02/2011 1104   personally reviewed   Wt Readings from Last 3 Encounters:  02/11/17 178 lb 6.4 oz (80.9 kg)  02/04/17 177 lb (80.3 kg)  01/14/17 180 lb 12 oz (82 kg)      Other studies personally reviewed: Additional studies/ records that were reviewed today include: AF clinic notes, prior echo  Review of the above records today demonstrates: as above   ASSESSMENT AND PLAN:  1.  Paroxysmal atrial fibrillation The patient has symptomatic afib. He has failed medical therapy with flecainide Further therapy has been limited by bradycardia. Therapeutic strategies for afib including medicine (tikosyn) and ablation were discussed in detail with the patient today. Risk, benefits, and alternatives to EP study and radiofrequency ablation for afib were also discussed in detail today. These risks include but are not limited to stroke, bleeding, vascular damage, tamponade, perforation, damage to the esophagus, lungs, and other structures, pulmonary vein stenosis, worsening renal function, and death. The patient understands these risk and wishes to proceed.  We will therefore proceed with catheter ablation at the next available time.  Cardiac CT is planned prior to ablation to exclude LAA thrombus His chads2vasc score is 1-2 (EF 45%).  His daughter works for [employer].  He would like to continue xarelto at this time.    Current medicines are reviewed at length with the patient today.    The patient does not have concerns regarding his medicines.  The following changes were made today:  none   Signed, Thompson Grayer, MD  02/11/2017 9:53 AM     [facility] [address] [place] [address] [phone] (office) [phone] (fax)

## 2017-02-26 NOTE — Anesthesia Postprocedure Evaluation (Signed)
Anesthesia Post Note  Patient: Derek [name]  Procedure(s) Performed: Procedure(s) (LRB): Atrial Fibrillation Ablation (N/A)     Patient location during evaluation: PACU Anesthesia Type: General Level of consciousness: awake and alert and oriented Pain management: pain level controlled Vital Signs Assessment: post-procedure vital signs reviewed and stable Respiratory status: spontaneous breathing, nonlabored ventilation, respiratory function stable and patient connected to nasal cannula oxygen Cardiovascular status: blood pressure returned to baseline and stable Postop Assessment: no signs of nausea or vomiting Anesthetic complications: no    Last Vitals:  Vitals:   02/26/17 1135 02/26/17 1145  BP: 118/70 113/70  Pulse: 72 71  Resp: 13 15  Temp:      Last Pain:  Vitals:   02/26/17 1054  TempSrc: Temporal                 Damen Windsor A.

## 2017-02-26 NOTE — Discharge Summary (Signed)
ELECTROPHYSIOLOGY PROCEDURE DISCHARGE SUMMARY    Patient ID: Derek [name],  MRN: [id number], DOB/AGE: [birth year] 71 y.o.  Admit date: 02/26/2017 Discharge date: 02/27/2017  Primary Care Physician: [name], MD Primary Cardiologist: Johnsie Cancel Electrophysiologist: Thompson Grayer, MD  Primary Discharge Diagnosis:  Paroxysmal atrial fibrillation status post ablation this admission  Secondary Discharge Diagnosis:  1.  Chronic systolic heart failure 2.  Prior rheumatic fever 3.  Sinus bradycardia  Procedures This Admission:  1.  Electrophysiology study and radiofrequency catheter ablation on 02/26/17 by Dr Thompson Grayer.  This study demonstrated sinus bradycardia upon presentation; intracardiac echo reveals a moderate sized left atrium with four separate pulmonary veins without evidence of pulmonary vein stenosis; successful electrical isolation and anatomical encircling of all four pulmonary veins with radiofrequency current.  Though a WACA approach was used, antral ablation was ultimately required in order to achieve PV isolation; additional ablation performed at the SVC/RA junction; no inducible arrhythmias following ablation both on and off of Isuprel; no early apparent complications.  Brief HPI: Derek [name] is a 71 y.o. male with a history of paroxysmal atrial fibrillation.  They have failed medical therapy with flecainide.  Other medical therapy is limited by sinus bradycardia. Risks, benefits, and alternatives to catheter ablation of atrial fibrillation were reviewed with the patient who wished to proceed.  The patient underwent TEE prior to the procedure which demonstrated normal LV function and no LAA thrombus.    Hospital Course:  The patient was admitted and underwent EPS/RFCA of atrial fibrillation with details as outlined above.  They were monitored on telemetry overnight which demonstrated sinus rhythm.  Groin was without complication on the day of discharge.  The patient was  examined and considered to be stable for discharge.  Wound care and restrictions were reviewed with the patient.  The patient will be seen back by [name], NP in 4 weeks and Dr [name] in 12 weeks for post ablation follow up.   This patients CHA2DS2-VASc Score and unadjusted Ischemic Stroke Rate (% per year) is equal to 0.6 % stroke rate/year from a score of 1 Above score calculated as 1 point each if present [CHF, HTN, DM, Vascular=MI/PAD/Aortic Plaque, Age if 65-74, or Male] Above score calculated as 2 points each if present [Age > 75, or Stroke/TIA/TE]   Physical Exam: Vitals:   02/26/17 1815 02/26/17 1915 02/26/17 2025 02/27/17 0440  BP: 96/72 107/71 110/65 110/63  Pulse:   67 68  Resp: (!) 28 18 18 18   Temp:   98.3 F (36.8 C) 98 F (36.7 C)  TempSrc:   Oral Oral  SpO2:   98% 97%  Weight:      Height:        GEN- The patient is well appearing, alert and oriented x 3 today.   HEENT: normocephalic, atraumatic; sclera clear, conjunctiva pink; hearing intact; oropharynx clear; neck supple  Lungs- Clear to ausculation bilaterally, normal work of breathing.  No wheezes, rales, rhonchi Heart- Regular rate and rhythm, no murmurs, rubs or gallops  GI- soft, non-tender, non-distended, bowel sounds present  Extremities- no clubbing, cyanosis, or edema; DP/PT/radial pulses 2+ bilaterally, groin without hematoma/bruit MS- no significant deformity or atrophy Skin- warm and dry, no rash or lesion Psych- euthymic mood, full affect Neuro- strength and sensation are intact   Labs:   Lab Results  Component Value Date   WBC 5.0 02/11/2017   HGB 15.5 10/30/2016   HCT 39.1 02/11/2017   MCV 90 02/11/2017  PLT 205 02/11/2017   No results for input(s): NA, K, CL, CO2, BUN, CREATININE, CALCIUM, PROT, BILITOT, ALKPHOS, ALT, AST, GLUCOSE in the last 168 hours.  Invalid input(s): LABALBU   Discharge Medications:  Allergies as of 02/27/2017      Reactions   Flecainide Other (See  Comments)   Dizziness and fatigue      Medication List    TAKE these medications   pantoprazole 40 MG tablet Commonly known as:  PROTONIX Take 1 tablet (40 mg total) by mouth daily. Take for 6 weeks then stop   rivaroxaban 20 MG Tabs tablet Commonly known as:  XARELTO Take 1 tablet (20 mg total) by mouth daily with supper.       Disposition:  Discharge Instructions    Diet - low sodium heart healthy    Complete by:  As directed    Increase activity slowly    Complete by:  As directed      Follow-up Information    [facility] Follow up on 04/05/2017.   Specialty:  Cardiology Why:  at 9:30AM  Contact information: [address] [id number] [place] [address] [phone]       Thompson Grayer, MD Follow up on 05/29/2017.   Specialty:  Cardiology Why:  at 9:30AM  Contact information: [place] [address] [phone]           Duration of Discharge Encounter: Greater than 30 minutes including physician time.  Signed, Chanetta Marshall, NP 02/27/2017 7:13 AM   I have seen, examined the patient, and reviewed the above assessment and plan.  On exam, RRR.  Groin is without hematoma/ bruit.  Changes to above are made where necessary.   Continue xarelto.  Routine follow-up  Co Sign: Thompson Grayer, MD 02/27/2017 7:45 AM

## 2017-02-26 NOTE — Progress Notes (Signed)
Received patient from cath lab, patient complaining of lower abd pain/pressure with inability to void.  Bladder scanned patient with >700cc showing.  Patient I/O cath with 800 cc out.  Dr. [name] updated.  Sanda Linger

## 2017-02-26 NOTE — Interval H&P Note (Signed)
History and Physical Interval Note:  02/26/2017 7:10 AM  Derek [name]  has presented today for surgery, with the diagnosis of afib  The various methods of treatment have been discussed with the patient and family. After consideration of risks, benefits and other options for treatment, the patient has consented to  Procedure(s): Atrial Fibrillation Ablation (N/A) as a surgical intervention .  The patient's history has been reviewed, patient examined, no change in status, stable for surgery.  I have reviewed the patient's chart and labs.  Questions were answered to the patient's satisfaction.     Cardiac CT is reviewed with the patient today.  No thrombus noted within the LAA.  Will proceed with ablation.  Thompson Grayer

## 2017-02-26 NOTE — Progress Notes (Signed)
62f,14f, and 11 f sheath removed from RFV. Manual pressure held x 15 min. No ecchymosis, no hematoma, no redness noted to rfv site. Pt. Tolerated well. A dry sterile dressing placed over RFV site. Pt. Advised if he laughs, coughs, sneezes to hold pressure. Pt. Verbally acknowledges understanding of these instructions. Pt. Return demonstrates his ability to hold pressure on the RFV site if he laughs, coughs,sneezes, or bares down to urinate etc.

## 2017-02-26 NOTE — Anesthesia Procedure Notes (Signed)
Procedure Name: LMA Insertion Date/Time: 02/26/2017 7:46 AM Performed by: Carney Living Pre-anesthesia Checklist: Patient identified, Emergency Drugs available, Suction available, Patient being monitored and Timeout performed Patient Re-evaluated:Patient Re-evaluated prior to inductionOxygen Delivery Method: Circle system utilized Preoxygenation: Pre-oxygenation with 100% oxygen Intubation Type: IV induction Ventilation: Mask ventilation without difficulty LMA: LMA inserted LMA Size: 5.0 Tube type: Oral Number of attempts: 1 Placement Confirmation: positive ETCO2 and breath sounds checked- equal and bilateral Tube secured with: Tape Dental Injury: Teeth and Oropharynx as per pre-operative assessment

## 2017-02-27 DIAGNOSIS — Z7901 Long term (current) use of anticoagulants: Secondary | ICD-10-CM | POA: Diagnosis not present

## 2017-02-27 DIAGNOSIS — I480 Paroxysmal atrial fibrillation: Secondary | ICD-10-CM | POA: Diagnosis not present

## 2017-02-27 DIAGNOSIS — Z87891 Personal history of nicotine dependence: Secondary | ICD-10-CM | POA: Diagnosis not present

## 2017-02-27 DIAGNOSIS — I5022 Chronic systolic (congestive) heart failure: Secondary | ICD-10-CM | POA: Diagnosis not present

## 2017-02-27 MED ORDER — PANTOPRAZOLE SODIUM 40 MG PO TBEC: 40.0000 mg | DELAYED_RELEASE_TABLET | Freq: Every day | ORAL | 0 refills | Status: DC

## 2017-02-27 NOTE — Plan of Care (Signed)
Problem: Health Behavior/Discharge Planning: Goal: Ability to manage health-related needs will improve Outcome: Adequate for Discharge Patient will be discharged 6//18 with discharge paperwork and sent home with a copy.

## 2017-02-27 NOTE — Plan of Care (Signed)
Problem: Education: Goal: Knowledge of [facility] Education information/materials will improve Outcome: Adequate for Discharge Patient discharge paperwork will include information on wound care, and medications, will be gone over at discharge.

## 2017-02-28 LAB — POCT ACTIVATED CLOTTING TIME
ACTIVATED CLOTTING TIME: 268 s
ACTIVATED CLOTTING TIME: 312 s
Activated Clotting Time: 334 seconds
Activated Clotting Time: 346 seconds

## 2017-03-04 ENCOUNTER — Encounter (HOSPITAL_COMMUNITY): Payer: Self-pay | Admitting: Nurse Practitioner

## 2017-03-04 ENCOUNTER — Ambulatory Visit (HOSPITAL_COMMUNITY)
Admission: RE | Admit: 2017-03-04 | Discharge: 2017-03-04 | Disposition: A | Discharge status: Home / Self Care | Payer: Medicare Other | Source: Ambulatory Visit | Attending: Nurse Practitioner | Admitting: Nurse Practitioner

## 2017-03-04 VITALS — BP 102/72 | HR 137 | Ht 70.0 in | Wt 171.6 lb

## 2017-03-04 DIAGNOSIS — Z8546 Personal history of malignant neoplasm of prostate: Secondary | ICD-10-CM | POA: Insufficient documentation

## 2017-03-04 DIAGNOSIS — Z7901 Long term (current) use of anticoagulants: Secondary | ICD-10-CM | POA: Insufficient documentation

## 2017-03-04 DIAGNOSIS — I480 Paroxysmal atrial fibrillation: Secondary | ICD-10-CM | POA: Insufficient documentation

## 2017-03-04 DIAGNOSIS — Z9889 Other specified postprocedural states: Secondary | ICD-10-CM | POA: Diagnosis not present

## 2017-03-04 DIAGNOSIS — I4819 Other persistent atrial fibrillation: Secondary | ICD-10-CM

## 2017-03-04 DIAGNOSIS — Z85828 Personal history of other malignant neoplasm of skin: Secondary | ICD-10-CM | POA: Insufficient documentation

## 2017-03-04 DIAGNOSIS — I481 Persistent atrial fibrillation: Secondary | ICD-10-CM

## 2017-03-04 DIAGNOSIS — Z87891 Personal history of nicotine dependence: Secondary | ICD-10-CM | POA: Insufficient documentation

## 2017-03-04 LAB — BASIC METABOLIC PANEL
Anion gap: 8 (ref 5–15)
BUN: 24 mg/dL — AB (ref 6–20)
CALCIUM: 9.8 mg/dL (ref 8.9–10.3)
CHLORIDE: 102 mmol/L (ref 101–111)
CO2: 28 mmol/L (ref 22–32)
CREATININE: 1.08 mg/dL (ref 0.61–1.24)
GFR calc Af Amer: >60 mL/min (ref >60)
GFR calc non Af Amer: >60 mL/min (ref >60)
GLUCOSE: 102 mg/dL — AB (ref 65–99)
Potassium: 4.9 mmol/L (ref 3.5–5.1)
Sodium: 138 mmol/L (ref 135–145)

## 2017-03-04 LAB — CBC
HEMATOCRIT: 45.5 % (ref 39.0–52.0)
HEMOGLOBIN: 15.7 g/dL (ref 13.0–17.0)
MCH: 31.3 pg (ref 26.0–34.0)
MCHC: 34.5 g/dL (ref 30.0–36.0)
MCV: 90.8 fL (ref 78.0–100.0)
Platelets: 235 10*3/uL (ref 150–400)
RBC: 5.01 MIL/uL (ref 4.22–5.81)
RDW: 12.9 % (ref 11.5–15.5)
WBC: 7.7 10*3/uL (ref 4.0–10.5)

## 2017-03-04 MED ORDER — DILTIAZEM HCL ER COATED BEADS 120 MG PO CP24: 120.0000 mg | ORAL_CAPSULE | Freq: Every day | ORAL | 1 refills | Status: DC

## 2017-03-04 NOTE — Progress Notes (Signed)
Primary Care Physician: [name], MD Referring Physician: Breccan Galant is a 71 y.o. male with a h/o rheumatic fever(no structural abnormalities), and atrial fibrillation, new onset in February with v rates around 100 bpm in PCP office. Afib was in the setting of 3 root canals with infection. He was then sent to Dr. Johnsie Cancel and plans were for DCCV but he converted spontaneously. He had return of afib in March. Echo showed EF 45-50% in afib with mildly dilated root. He was placed on xarelto with a chadsvasc score of 1. He was scheduled for a ETT and event monitor. He was placed on flecainide 50 mg bid on 4/2. He felt progressively worse while taking drug, with fatigue and dizziness and took himself off drug 5/8. Monitor has revealed SR with afib. Strips form 5/13 at 5:46 am showed a slow afib in the 30's with pauses up to 3.2 seconds, this was while pt was sleeping. Another slow afib epiosde with pauses was reported on 4/21 at 5:51 am.  Pt denies snoring/apnea. He is a retired Engineer, structural and enjoys running. He is disappointed that he has not been able to run for the last month with the afib condition. He does not drink, no tobacco.His daughter is a Psychologist, forensic. He does feel better off flecainide.  F/u in afib clinic, 6/11. He asked to be seen after feeling funny following ablation 6/5. He states that he was aware of a pressure in his chest over the weekend and heart felt like it was beating fast, felt short of breath. EKG today shows afib with rvr at 137 bpm. He is not on rate control due to brady and pauses seen on event monitor  prior to ablation.  Today, he denies symptoms of palpitations, chest pain, shortness of breath, orthopnea, PND, lower extremity edema, dizziness, presyncope, syncope, or neurologic sequela. The patient is tolerating medications without difficulties and is otherwise without complaint today.   Past Medical History:  Diagnosis Date  . Basal cell  carcinoma 2009   back  . Cancer of prostate ([place])    pT2c No Mx, Gleason 3+4=7  . Chronic systolic dysfunction of left ventricle    EF 45% by echo 2/18  . Diverticulitis   . Hypogonadism male   . Kidney stones   . Left rotator cuff tear 07/10/2013  . Nephrolithiasis   . Paroxysmal atrial fibrillation ([facility])   . Rheumatic fever 1952-1953  . Rheumatic fever    HISTORY OF AT AGE 37   Past Surgical History:  Procedure Laterality Date  . ABLATION OF DYSRHYTHMIC FOCUS  02/26/2017  . ATRIAL FIBRILLATION ABLATION N/A 02/26/2017   Procedure: Atrial Fibrillation Ablation;  Surgeon: Thompson Grayer, MD;  Location: [facility];  Service: Cardiovascular;  Laterality: N/A;  . bilateral inguinal hernia repair  07/10/2012   lap BIH repairs  . COLONOSCOPY    . HERNIA REPAIR  07/10/12   LIH/rih-umb  . KNEE SURGERY  2005   Duda- arthroscopy, partial medial menisectomy-lt  . PROSTATE SURGERY  12/19/06   robotic prostatectomy  . rotator cuff surgery  2002   Gioffre-rt  . SHOULDER ARTHROSCOPY WITH ROTATOR CUFF REPAIR AND SUBACROMIAL DECOMPRESSION Left 07/10/2013   Procedure: LEFT SHOULDER ARTHROSCOPY WITH ARTHROSCOPIC ROTATOR CUFF REPAIR AND SUBACROMIAL DECOMPRESSION, PARTIAL ACROMIOPLASTY WITH CORACROMIAL RELEASE;  Surgeon: Johnny Bridge, MD;  Location: [facility];  Service: Orthopedics;  Laterality: Left;    Current Outpatient Prescriptions  Medication Sig Dispense Refill  .  pantoprazole (PROTONIX) 40 MG tablet Take 1 tablet (40 mg total) by mouth daily. Take for 6 weeks then stop 45 tablet 0  . rivaroxaban (XARELTO) 20 MG TABS tablet Take 1 tablet (20 mg total) by mouth daily with supper. 30 tablet 6  . diltiazem (CARDIZEM CD) 120 MG 24 hr capsule Take 1 capsule (120 mg total) by mouth daily. 30 capsule 1   No current facility-administered medications for this encounter.     Allergies  Allergen Reactions  . Flecainide Other (See Comments)    Dizziness and fatigue     Social History   Social History  . Marital status: Married    Spouse name: N/A  . Number of children: N/A  . Years of education: N/A   Occupational History  . retired Licensed conveyancer) Health visitor   Social History Main Topics  . Smoking status: Former Smoker    Quit date: 09/25/1971  . Smokeless tobacco: Never Used  . Alcohol use No  . Drug use: No  . Sexual activity: Not on file   Other Topics Concern  . Not on file   Social History Narrative   Regular exercise: yes   Runner, former marathon runner    Family History  Problem Relation Age of Onset  . Breast cancer Mother   . Cancer Brother 32       brain tumor  . Heart disease Unknown        fam hx  . Arthritis Other        other relative  . Prostate cancer Other        nephew  . Lung cancer Other 50       nephew  . Arrhythmia Father     ROS- All systems are reviewed and negative except as per the HPI above  Physical Exam: Vitals:   03/04/17 1057  BP: 102/72  Pulse: (!) 137  Weight: 171 lb 9.6 oz (77.8 kg)  Height: 5\' 10"  (1.778 m)   Wt Readings from Last 3 Encounters:  03/04/17 171 lb 9.6 oz (77.8 kg)  02/26/17 173 lb (78.5 kg)  02/11/17 178 lb 6.4 oz (80.9 kg)    Labs: Lab Results  Component Value Date   NA 138 03/04/2017   K 4.9 03/04/2017   CL 102 03/04/2017   CO2 28 03/04/2017   GLUCOSE 102 (H) 03/04/2017   BUN 24 (H) 03/04/2017   CREATININE 1.08 03/04/2017   CALCIUM 9.8 03/04/2017   Lab Results  Component Value Date   INR 1.1 11/16/2016   Lab Results  Component Value Date   CHOL 209 (H) 06/25/2016   HDL 69.50 06/25/2016   LDLCALC 129 (H) 06/25/2016   TRIG 55.0 06/25/2016     GEN- The patient is well appearing, alert and oriented x 3 today.   Head- normocephalic, atraumatic Eyes-  Sclera clear, conjunctiva pink Ears- hearing intact Oropharynx- clear Neck- supple, no JVP Lymph- no cervical lymphadenopathy Lungs- Clear to ausculation bilaterally, normal work of  breathing Heart- irregular rate and rhythm, no murmurs, rubs or gallops, PMI not laterally displaced GI- soft, NT, ND, + BS Extremities- no clubbing, cyanosis, or edema MS- no significant deformity or atrophy Skin- no rash or lesion Psych- euthymic mood, full affect Neuro- strength and sensation are intact  EKG-afib at 137 bpm, qrs int 82 ms, qtc 480 ms Epic records reviewed    Assessment and Plan:  1. afib Did not tolerate flecainide  S/p ablation 6/5 with ERAF Did not  tolerate flecainide and is off drug since 5/8 His is appropriately on xarelto 20 mg daily for a chadsvasc score of 1 Will start cardizem 120 mg daily and plan on cardioversion, next available 5/14 No missed doses of xarelto    Butch Penny C. Carroll, [facility] [address] [place], [place] [address] [phone]

## 2017-03-04 NOTE — Patient Instructions (Signed)
Cardioversion scheduled for Thursday, June 14th  - Arrive at the [facility] and go to admitting at 7:30AM  -Do not eat or drink anything after midnight the night prior to your procedure.  - Take all your medication with a sip of water prior to arrival.  - You will not be able to drive home after your procedure.   Your physician has recommended you make the following change in your medication:  1)Start Cardizem CD 120mg  once a day -- start this afternoon

## 2017-03-06 ENCOUNTER — Ambulatory Visit: Payer: Medicare Other | Admitting: Cardiovascular Disease

## 2017-03-06 ENCOUNTER — Ambulatory Visit (HOSPITAL_COMMUNITY)
Admission: RE | Admit: 2017-03-06 | Discharge: 2017-03-06 | Disposition: A | Discharge status: Home / Self Care | Payer: Medicare Other | Source: Ambulatory Visit | Attending: Nurse Practitioner | Admitting: Nurse Practitioner

## 2017-03-06 ENCOUNTER — Encounter (HOSPITAL_COMMUNITY): Payer: Self-pay | Admitting: Nurse Practitioner

## 2017-03-06 DIAGNOSIS — I4891 Unspecified atrial fibrillation: Secondary | ICD-10-CM | POA: Insufficient documentation

## 2017-03-06 DIAGNOSIS — R9431 Abnormal electrocardiogram [ECG] [EKG]: Secondary | ICD-10-CM | POA: Diagnosis present

## 2017-03-06 NOTE — Progress Notes (Addendum)
Pt in for repeat EKG since he is scheduled for a dccv June 14th.  Pt believes he is back in NSR.  Ekg to be reviewed by [name], NP  EKG continues to show afib at 106 bpm, he is scheduled for cardioversion in am.

## 2017-03-07 ENCOUNTER — Ambulatory Visit (HOSPITAL_COMMUNITY)
Admission: RE | Admit: 2017-03-07 | Discharge: 2017-03-07 | Disposition: A | Discharge status: Home / Self Care | Payer: Medicare Other | Source: Ambulatory Visit | Attending: Internal Medicine | Admitting: Internal Medicine

## 2017-03-07 ENCOUNTER — Ambulatory Visit (HOSPITAL_COMMUNITY): Payer: Medicare Other | Admitting: Anesthesiology

## 2017-03-07 ENCOUNTER — Encounter (HOSPITAL_COMMUNITY): Payer: Self-pay | Admitting: *Deleted

## 2017-03-07 ENCOUNTER — Encounter (HOSPITAL_COMMUNITY)
Admission: RE | Disposition: A | Discharge status: Home / Self Care | Payer: Self-pay | Source: Ambulatory Visit | Attending: Internal Medicine

## 2017-03-07 DIAGNOSIS — I480 Paroxysmal atrial fibrillation: Secondary | ICD-10-CM | POA: Insufficient documentation

## 2017-03-07 DIAGNOSIS — Z85828 Personal history of other malignant neoplasm of skin: Secondary | ICD-10-CM | POA: Diagnosis not present

## 2017-03-07 DIAGNOSIS — Z87891 Personal history of nicotine dependence: Secondary | ICD-10-CM | POA: Diagnosis not present

## 2017-03-07 DIAGNOSIS — Z8546 Personal history of malignant neoplasm of prostate: Secondary | ICD-10-CM | POA: Diagnosis not present

## 2017-03-07 DIAGNOSIS — I4891 Unspecified atrial fibrillation: Secondary | ICD-10-CM | POA: Diagnosis not present

## 2017-03-07 DIAGNOSIS — Z7901 Long term (current) use of anticoagulants: Secondary | ICD-10-CM | POA: Diagnosis not present

## 2017-03-07 HISTORY — PX: CARDIOVERSION: SHX1299

## 2017-03-07 SURGERY — CARDIOVERSION
Anesthesia: General

## 2017-03-07 MED ORDER — PROPOFOL 10 MG/ML IV BOLUS
INTRAVENOUS | Status: DC | PRN
  Administered 2017-03-07: 80 mg via INTRAVENOUS

## 2017-03-07 MED ORDER — LIDOCAINE 2% (20 MG/ML) 5 ML SYRINGE
INTRAMUSCULAR | Status: DC | PRN
  Administered 2017-03-07: 40 mg via INTRAVENOUS

## 2017-03-07 MED ORDER — SODIUM CHLORIDE 0.9 % IV SOLN
INTRAVENOUS | Status: DC
  Administered 2017-03-07: 08:00:00 via INTRAVENOUS

## 2017-03-07 NOTE — Transfer of Care (Signed)
Immediate Anesthesia Transfer of Care Note  Patient: ALTONIO SCHWERTNER  Procedure(s) Performed: Procedure(s): CARDIOVERSION (N/A)  Patient Location: Endoscopy Unit  Anesthesia Type:General  Level of Consciousness: awake and alert   Airway & Oxygen Therapy: Patient Spontanous Breathing and Patient connected to nasal cannula oxygen  Post-op Assessment: Report given to RN and Post -op Vital signs reviewed and stable  Post vital signs: Reviewed and stable  Last Vitals:  Vitals:   03/07/17 0846 03/07/17 0847  BP:    Pulse: 65 65  Resp: 14 19  Temp:      Last Pain:  Vitals:   03/07/17 0742  TempSrc: Oral         Complications: No apparent anesthesia complications

## 2017-03-07 NOTE — H&P (View-Only) (Signed)
Primary Care Physician: [name], MD Referring Physician: Johanan Skorupski is a 71 y.o. male with a h/o rheumatic fever(no structural abnormalities), and atrial fibrillation, new onset in February with v rates around 100 bpm in PCP office. Afib was in the setting of 3 root canals with infection. He was then sent to Dr. Johnsie Cancel and plans were for DCCV but he converted spontaneously. He had return of afib in March. Echo showed EF 45-50% in afib with mildly dilated root. He was placed on xarelto with a chadsvasc score of 1. He was scheduled for a ETT and event monitor. He was placed on flecainide 50 mg bid on 4/2. He felt progressively worse while taking drug, with fatigue and dizziness and took himself off drug 5/8. Monitor has revealed SR with afib. Strips form 5/13 at 5:46 am showed a slow afib in the 30's with pauses up to 3.2 seconds, this was while pt was sleeping. Another slow afib epiosde with pauses was reported on 4/21 at 5:51 am.  Pt denies snoring/apnea. He is a retired Engineer, structural and enjoys running. He is disappointed that he has not been able to run for the last month with the afib condition. He does not drink, no tobacco.His daughter is a Psychologist, forensic. He does feel better off flecainide.  F/u in afib clinic, 6/11. He asked to be seen after feeling funny following ablation 6/5. He states that he was aware of a pressure in his chest over the weekend and heart felt like it was beating fast, felt short of breath. EKG today shows afib with rvr at 137 bpm. He is not on rate control due to brady and pauses seen on event monitor  prior to ablation.  Today, he denies symptoms of palpitations, chest pain, shortness of breath, orthopnea, PND, lower extremity edema, dizziness, presyncope, syncope, or neurologic sequela. The patient is tolerating medications without difficulties and is otherwise without complaint today.   Past Medical History:  Diagnosis Date  . Basal cell  carcinoma 2009   back  . Cancer of prostate ([place])    pT2c No Mx, Gleason 3+4=7  . Chronic systolic dysfunction of left ventricle    EF 45% by echo 2/18  . Diverticulitis   . Hypogonadism male   . Kidney stones   . Left rotator cuff tear 07/10/2013  . Nephrolithiasis   . Paroxysmal atrial fibrillation ([facility])   . Rheumatic fever 1952-1953  . Rheumatic fever    HISTORY OF AT AGE 71   Past Surgical History:  Procedure Laterality Date  . ABLATION OF DYSRHYTHMIC FOCUS  02/26/2017  . ATRIAL FIBRILLATION ABLATION N/A 02/26/2017   Procedure: Atrial Fibrillation Ablation;  Surgeon: Thompson Grayer, MD;  Location: [facility];  Service: Cardiovascular;  Laterality: N/A;  . bilateral inguinal hernia repair  07/10/2012   lap BIH repairs  . COLONOSCOPY    . HERNIA REPAIR  07/10/12   LIH/rih-umb  . KNEE SURGERY  2005   Duda- arthroscopy, partial medial menisectomy-lt  . PROSTATE SURGERY  12/19/06   robotic prostatectomy  . rotator cuff surgery  2002   Gioffre-rt  . SHOULDER ARTHROSCOPY WITH ROTATOR CUFF REPAIR AND SUBACROMIAL DECOMPRESSION Left 07/10/2013   Procedure: LEFT SHOULDER ARTHROSCOPY WITH ARTHROSCOPIC ROTATOR CUFF REPAIR AND SUBACROMIAL DECOMPRESSION, PARTIAL ACROMIOPLASTY WITH CORACROMIAL RELEASE;  Surgeon: Johnny Bridge, MD;  Location: [facility];  Service: Orthopedics;  Laterality: Left;    Current Outpatient Prescriptions  Medication Sig Dispense Refill  .  pantoprazole (PROTONIX) 40 MG tablet Take 1 tablet (40 mg total) by mouth daily. Take for 6 weeks then stop 45 tablet 0  . rivaroxaban (XARELTO) 20 MG TABS tablet Take 1 tablet (20 mg total) by mouth daily with supper. 30 tablet 6  . diltiazem (CARDIZEM CD) 120 MG 24 hr capsule Take 1 capsule (120 mg total) by mouth daily. 30 capsule 1   No current facility-administered medications for this encounter.     Allergies  Allergen Reactions  . Flecainide Other (See Comments)    Dizziness and fatigue     Social History   Social History  . Marital status: Married    Spouse name: N/A  . Number of children: N/A  . Years of education: N/A   Occupational History  . retired Licensed conveyancer) Health visitor   Social History Main Topics  . Smoking status: Former Smoker    Quit date: 09/25/1971  . Smokeless tobacco: Never Used  . Alcohol use No  . Drug use: No  . Sexual activity: Not on file   Other Topics Concern  . Not on file   Social History Narrative   Regular exercise: yes   Runner, former marathon runner    Family History  Problem Relation Age of Onset  . Breast cancer Mother   . Cancer Brother 33       brain tumor  . Heart disease Unknown        fam hx  . Arthritis Other        other relative  . Prostate cancer Other        nephew  . Lung cancer Other 21       nephew  . Arrhythmia Father     ROS- All systems are reviewed and negative except as per the HPI above  Physical Exam: Vitals:   03/04/17 1057  BP: 102/72  Pulse: (!) 137  Weight: 171 lb 9.6 oz (77.8 kg)  Height: 5\' 10"  (1.778 m)   Wt Readings from Last 3 Encounters:  03/04/17 171 lb 9.6 oz (77.8 kg)  02/26/17 173 lb (78.5 kg)  02/11/17 178 lb 6.4 oz (80.9 kg)    Labs: Lab Results  Component Value Date   NA 138 03/04/2017   K 4.9 03/04/2017   CL 102 03/04/2017   CO2 28 03/04/2017   GLUCOSE 102 (H) 03/04/2017   BUN 24 (H) 03/04/2017   CREATININE 1.08 03/04/2017   CALCIUM 9.8 03/04/2017   Lab Results  Component Value Date   INR 1.1 11/16/2016   Lab Results  Component Value Date   CHOL 209 (H) 06/25/2016   HDL 69.50 06/25/2016   LDLCALC 129 (H) 06/25/2016   TRIG 55.0 06/25/2016     GEN- The patient is well appearing, alert and oriented x 3 today.   Head- normocephalic, atraumatic Eyes-  Sclera clear, conjunctiva pink Ears- hearing intact Oropharynx- clear Neck- supple, no JVP Lymph- no cervical lymphadenopathy Lungs- Clear to ausculation bilaterally, normal work of  breathing Heart- irregular rate and rhythm, no murmurs, rubs or gallops, PMI not laterally displaced GI- soft, NT, ND, + BS Extremities- no clubbing, cyanosis, or edema MS- no significant deformity or atrophy Skin- no rash or lesion Psych- euthymic mood, full affect Neuro- strength and sensation are intact  EKG-afib at 137 bpm, qrs int 82 ms, qtc 480 ms Epic records reviewed    Assessment and Plan:  1. afib Did not tolerate flecainide  S/p ablation 6/5 with ERAF Did not  tolerate flecainide and is off drug since 5/8 His is appropriately on xarelto 20 mg daily for a chadsvasc score of 1 Will start cardizem 120 mg daily and plan on cardioversion, next available 5/14 No missed doses of xarelto    Butch Penny C. Delsie Amador, [facility] [address] [place], [place] [address] [phone]

## 2017-03-07 NOTE — Anesthesia Postprocedure Evaluation (Signed)
Anesthesia Post Note  Patient: Derek [name]  Procedure(s) Performed: Procedure(s) (LRB): CARDIOVERSION (N/A)     Patient location during evaluation: Endoscopy Anesthesia Type: General Level of consciousness: awake, awake and alert and oriented Pain management: pain level controlled Vital Signs Assessment: post-procedure vital signs reviewed and stable Respiratory status: spontaneous breathing, nonlabored ventilation and respiratory function stable Cardiovascular status: blood pressure returned to baseline Anesthetic complications: no    Last Vitals:  Vitals:   03/07/17 0847 03/07/17 0853  BP:  97/68  Pulse: 65 65  Resp: 19 15  Temp:  36.4 C    Last Pain:  Vitals:   03/07/17 0853  TempSrc: Oral                 Derek [name]

## 2017-03-07 NOTE — Op Note (Signed)
Patient anesthetized with Propofol and lidocaine WIth pads in AP position pt cardioverted to SR with 200 J synchronized biphasic energy Procedure without complication 12 lead EKG pending

## 2017-03-07 NOTE — Interval H&P Note (Signed)
History and Physical Interval Note:  03/07/2017 8:31 AM  Derek [name]  has presented today for surgery, with the diagnosis of afib  The various methods of treatment have been discussed with the patient and family. After consideration of risks, benefits and other options for treatment, the patient has consented to  Procedure(s): CARDIOVERSION (N/A) as a surgical intervention .  The patient's history has been reviewed, patient examined, no change in status, stable for surgery.  I have reviewed the patient's chart and labs.  Questions were answered to the patient's satisfaction.     Dorris Carnes

## 2017-03-07 NOTE — Discharge Instructions (Signed)
Electrical Cardioversion, Care After °This sheet gives you information about how to care for yourself after your procedure. Your health care provider may also give you more specific instructions. If you have problems or questions, contact your health care provider. °What can I expect after the procedure? °After the procedure, it is common to have: °· Some redness on the skin where the shocks were given. ° °Follow these instructions at home: °· Do not drive for 24 hours if you were given a medicine to help you relax (sedative). °· Take over-the-counter and prescription medicines only as told by your health care provider. °· Ask your health care provider how to check your pulse. Check it often. °· Rest for 48 hours after the procedure or as told by your health care provider. °· Avoid or limit your caffeine use as told by your health care provider. °Contact a health care provider if: °· You feel like your heart is beating too quickly or your pulse is not regular. °· You have a serious muscle cramp that does not go away. °Get help right away if: °· You have discomfort in your chest. °· You are dizzy or you feel faint. °· You have trouble breathing or you are short of breath. °· Your speech is slurred. °· You have trouble moving an arm or leg on one side of your body. °· Your fingers or toes turn cold or blue. °This information is not intended to replace advice given to you by your health care provider. Make sure you discuss any questions you have with your health care provider. °Document Released: 07/01/2013 Document Revised: 04/13/2016 Document Reviewed: 03/16/2016 °Elsevier Interactive Patient Education © 2018 [employer] ° °

## 2017-03-07 NOTE — Anesthesia Preprocedure Evaluation (Addendum)
Anesthesia Evaluation  Patient identified by MRN, date of birth, ID band Patient awake    Reviewed: Allergy & Precautions, NPO status , Patient's Chart, lab work & pertinent test results  Airway Mallampati: I  TM Distance: >3 FB Neck ROM: Full    Dental  (+) Teeth Intact, Dental Advisory Given   Pulmonary former smoker,    breath sounds clear to auscultation       Cardiovascular + dysrhythmias Atrial Fibrillation  Rhythm:Irregular Rate:Normal     Neuro/Psych    GI/Hepatic   Endo/Other    Renal/GU      Musculoskeletal   Abdominal   Peds  Hematology   Anesthesia Other Findings   Reproductive/Obstetrics                            Anesthesia Physical Anesthesia Plan  ASA: III  Anesthesia Plan: General   Post-op Pain Management:    Induction: Intravenous  PONV Risk Score and Plan: Propofol  Airway Management Planned: Mask  Additional Equipment:   Intra-op Plan:   Post-operative Plan:   Informed Consent: I have reviewed the patients History and Physical, chart, labs and discussed the procedure including the risks, benefits and alternatives for the proposed anesthesia with the patient or authorized representative who has indicated his/her understanding and acceptance.     Plan Discussed with: Anesthesiologist and CRNA  Anesthesia Plan Comments:         Anesthesia Quick Evaluation

## 2017-03-09 ENCOUNTER — Encounter (HOSPITAL_COMMUNITY): Payer: Self-pay | Admitting: Internal Medicine

## 2017-03-18 ENCOUNTER — Other Ambulatory Visit (HOSPITAL_COMMUNITY): Payer: Self-pay | Admitting: *Deleted

## 2017-03-18 ENCOUNTER — Ambulatory Visit: Payer: Medicare Other | Admitting: Cardiovascular Disease

## 2017-03-18 ENCOUNTER — Encounter (HOSPITAL_COMMUNITY): Payer: Self-pay | Admitting: Nurse Practitioner

## 2017-03-18 ENCOUNTER — Ambulatory Visit (HOSPITAL_COMMUNITY)
Admission: RE | Admit: 2017-03-18 | Discharge: 2017-03-18 | Disposition: A | Discharge status: Home / Self Care | Payer: Medicare Other | Source: Ambulatory Visit | Attending: Nurse Practitioner | Admitting: Nurse Practitioner

## 2017-03-18 VITALS — BP 108/76 | HR 103 | Ht 70.0 in | Wt 173.2 lb

## 2017-03-18 DIAGNOSIS — Z8249 Family history of ischemic heart disease and other diseases of the circulatory system: Secondary | ICD-10-CM | POA: Diagnosis not present

## 2017-03-18 DIAGNOSIS — I480 Paroxysmal atrial fibrillation: Secondary | ICD-10-CM | POA: Insufficient documentation

## 2017-03-18 DIAGNOSIS — Z8261 Family history of arthritis: Secondary | ICD-10-CM | POA: Diagnosis not present

## 2017-03-18 DIAGNOSIS — Z9889 Other specified postprocedural states: Secondary | ICD-10-CM | POA: Insufficient documentation

## 2017-03-18 DIAGNOSIS — Z801 Family history of malignant neoplasm of trachea, bronchus and lung: Secondary | ICD-10-CM | POA: Insufficient documentation

## 2017-03-18 DIAGNOSIS — Z8546 Personal history of malignant neoplasm of prostate: Secondary | ICD-10-CM | POA: Insufficient documentation

## 2017-03-18 DIAGNOSIS — I4891 Unspecified atrial fibrillation: Secondary | ICD-10-CM | POA: Diagnosis not present

## 2017-03-18 DIAGNOSIS — Z7901 Long term (current) use of anticoagulants: Secondary | ICD-10-CM | POA: Insufficient documentation

## 2017-03-18 DIAGNOSIS — Z79899 Other long term (current) drug therapy: Secondary | ICD-10-CM | POA: Insufficient documentation

## 2017-03-18 DIAGNOSIS — Z8042 Family history of malignant neoplasm of prostate: Secondary | ICD-10-CM | POA: Diagnosis not present

## 2017-03-18 DIAGNOSIS — I4819 Other persistent atrial fibrillation: Secondary | ICD-10-CM

## 2017-03-18 DIAGNOSIS — Z85828 Personal history of other malignant neoplasm of skin: Secondary | ICD-10-CM | POA: Insufficient documentation

## 2017-03-18 DIAGNOSIS — I481 Persistent atrial fibrillation: Secondary | ICD-10-CM

## 2017-03-18 DIAGNOSIS — Z87891 Personal history of nicotine dependence: Secondary | ICD-10-CM | POA: Insufficient documentation

## 2017-03-18 MED ORDER — AMIODARONE HCL 200 MG PO TABS: 200.0000 mg | ORAL_TABLET | Freq: Two times a day (BID) | ORAL | 0 refills | Status: DC

## 2017-03-18 NOTE — Addendum Note (Signed)
Encounter addended by: Sherran Needs, NP on: 03/18/2017  9:20 AM<BR>    Actions taken: Sign clinical note

## 2017-03-18 NOTE — Progress Notes (Addendum)
Primary Care Physician: [name], MD Referring Physician: Ahamed Hofland is a 71 y.o. male with a h/o rheumatic fever(no structural abnormalities), and atrial fibrillation, new onset in February with v rates around 100 bpm in PCP office. Afib was in the setting of 3 root canals with infection. He was then sent to Dr. Johnsie Cancel and plans were for DCCV but he converted spontaneously. He had return of afib in March. Echo showed EF 45-50% in afib with mildly dilated root. He was placed on xarelto with a chadsvasc score of 1. He was scheduled for a ETT and event monitor. He was placed on flecainide 50 mg bid on 4/2. He felt progressively worse while taking drug, with fatigue and dizziness and took himself off drug 5/8. Monitor has revealed SR with afib. Strips form 5/13 at 5:46 am showed a slow afib in the 30's with pauses up to 3.2 seconds, this was while pt was sleeping. Another slow afib epiosde with pauses was reported on 4/21 at 5:51 am.  Pt denies snoring/apnea. He is a retired Engineer, structural and enjoys running. He is disappointed that he has not been able to run for the last month with the afib condition. He does not drink, no tobacco.His daughter is a Psychologist, forensic. He does feel better off flecainide.  F/u in afib clinic, 6/11. He asked to be seen after feeling funny following ablation 6/5. He states that he was aware of a pressure in his chest over the weekend and heart felt like it was beating fast, felt short of breath. EKG today shows afib with rvr at 137 bpm. He is not on rate control due to brady and pauses seen on event monitor  prior to ablation.  F/u in afib clinic, 6/25. He had a cardioversion 6/14, but feels that he went back into afib the day after.He is able to exercise and has had fairly good rate control, but knew his heart rate numbers were higher since the cardioversion.   Today, he denies symptoms of palpitations, chest pain, shortness of breath, orthopnea, PND,  lower extremity edema, dizziness, presyncope, syncope, or neurologic sequela. The patient is tolerating medications without difficulties and is otherwise without complaint today.   Past Medical History:  Diagnosis Date  . Basal cell carcinoma 2009   back  . Cancer of prostate ([place])    pT2c No Mx, Gleason 3+4=7  . Chronic systolic dysfunction of left ventricle    EF 45% by echo 2/18  . Diverticulitis   . Hypogonadism male   . Kidney stones   . Left rotator cuff tear 07/10/2013  . Nephrolithiasis   . Paroxysmal atrial fibrillation (HCC)   . Rheumatic fever 1952-1953  . Rheumatic fever    HISTORY OF AT AGE 26   Past Surgical History:  Procedure Laterality Date  . ABLATION OF DYSRHYTHMIC FOCUS  02/26/2017  . ATRIAL FIBRILLATION ABLATION N/A 02/26/2017   Procedure: Atrial Fibrillation Ablation;  Surgeon: Thompson Grayer, MD;  Location: [employer] [facility] CV [facility];  Service: Cardiovascular;  Laterality: N/A;  . bilateral inguinal hernia repair  07/10/2012   lap BIH repairs  . CARDIOVERSION N/A 03/07/2017   Procedure: CARDIOVERSION;  Surgeon: Fay Records, MD;  Location: [facility] ENDOSCOPY;  Service: Cardiovascular;  Laterality: N/A;  . COLONOSCOPY    . HERNIA REPAIR  07/10/12   LIH/rih-umb  . KNEE SURGERY  2005   Duda- arthroscopy, partial medial menisectomy-lt  . PROSTATE SURGERY  12/19/06   robotic prostatectomy  .  rotator cuff surgery  2002   Gioffre-rt  . SHOULDER ARTHROSCOPY WITH ROTATOR CUFF REPAIR AND SUBACROMIAL DECOMPRESSION Left 07/10/2013   Procedure: LEFT SHOULDER ARTHROSCOPY WITH ARTHROSCOPIC ROTATOR CUFF REPAIR AND SUBACROMIAL DECOMPRESSION, PARTIAL ACROMIOPLASTY WITH CORACROMIAL RELEASE;  Surgeon: Johnny Bridge, MD;  Location: [place];  Service: Orthopedics;  Laterality: Left;    Current Outpatient Prescriptions  Medication Sig Dispense Refill  . diltiazem (CARDIZEM CD) 120 MG 24 hr capsule Take 1 capsule (120 mg total) by mouth daily. 30 capsule 1  . pantoprazole  (PROTONIX) 40 MG tablet Take 1 tablet (40 mg total) by mouth daily. Take for 6 weeks then stop 45 tablet 0  . rivaroxaban (XARELTO) 20 MG TABS tablet Take 1 tablet (20 mg total) by mouth daily with supper. 30 tablet 6   No current facility-administered medications for this encounter.     Allergies  Allergen Reactions  . Flecainide Other (See Comments)    Dizziness and fatigue    Social History   Social History  . Marital status: Married    Spouse name: N/A  . Number of children: N/A  . Years of education: N/A   Occupational History  . retired Licensed conveyancer) Health visitor   Social History Main Topics  . Smoking status: Former Smoker    Quit date: 09/25/1971  . Smokeless tobacco: Never Used  . Alcohol use No  . Drug use: No  . Sexual activity: Not on file   Other Topics Concern  . Not on file   Social History Narrative   Regular exercise: yes   Runner, former marathon runner    Family History  Problem Relation Age of Onset  . Breast cancer Mother   . Cancer Brother 41       brain tumor  . Heart disease Unknown        fam hx  . Arthritis Other        other relative  . Prostate cancer Other        nephew  . Lung cancer Other 37       nephew  . Arrhythmia Father     ROS- All systems are reviewed and negative except as per the HPI above  Physical Exam: Vitals:   03/18/17 0828  BP: 108/76  Pulse: (!) 103  Weight: 173 lb 3.2 oz (78.6 kg)  Height: 5\' 10"  (1.778 m)   Wt Readings from Last 3 Encounters:  03/18/17 173 lb 3.2 oz (78.6 kg)  03/07/17 171 lb (77.6 kg)  03/04/17 171 lb 9.6 oz (77.8 kg)    Labs: Lab Results  Component Value Date   NA 138 03/04/2017   K 4.9 03/04/2017   CL 102 03/04/2017   CO2 28 03/04/2017   GLUCOSE 102 (H) 03/04/2017   BUN 24 (H) 03/04/2017   CREATININE 1.08 03/04/2017   CALCIUM 9.8 03/04/2017   Lab Results  Component Value Date   INR 1.1 11/16/2016   Lab Results  Component Value Date   CHOL 209 (H)  06/25/2016   HDL 69.50 06/25/2016   LDLCALC 129 (H) 06/25/2016   TRIG 55.0 06/25/2016     GEN- The patient is well appearing, alert and oriented x 3 today.   Head- normocephalic, atraumatic Eyes-  Sclera clear, conjunctiva pink Ears- hearing intact Oropharynx- clear Neck- supple, no JVP Lymph- no cervical lymphadenopathy Lungs- Clear to ausculation bilaterally, normal work of breathing Heart- irregular rate and rhythm, no murmurs, rubs or gallops, PMI not laterally  displaced GI- soft, NT, ND, + BS Extremities- no clubbing, cyanosis, or edema MS- no significant deformity or atrophy Skin- no rash or lesion Psych- euthymic mood, full affect Neuro- strength and sensation are intact  EKG-afib at 103 bpm, qrs int 80 ms, qtc 437 ms Epic records reviewed    Assessment and Plan:  1. afib S/p ablation 6/5 with ERAF Did not tolerate flecainide and is off drug since 5/8 Successful cardioversion but with ERAF one day later His is appropriately on xarelto 20 mg daily for a chadsvasc score of 1 Continue cardizem 120 mg daily with good rate control and not overly symptomatic Hesitate to restart flecainide with prior pauses Discussed with Dr. [name] alternate antiarrythmic therapy and he suggested short term amiodarone 1-2 months  Will discuss loading with pt and he will be back in office for EKG in one week after starting drug   Butch Penny C. Roselinda Bahena, [facility] [address] [place], [place] [address] [phone]

## 2017-03-25 ENCOUNTER — Ambulatory Visit (HOSPITAL_COMMUNITY)
Admission: RE | Admit: 2017-03-25 | Discharge: 2017-03-25 | Disposition: A | Discharge status: Home / Self Care | Payer: Medicare Other | Source: Ambulatory Visit | Attending: Nurse Practitioner | Admitting: Nurse Practitioner

## 2017-03-25 DIAGNOSIS — I4891 Unspecified atrial fibrillation: Secondary | ICD-10-CM | POA: Diagnosis present

## 2017-03-25 DIAGNOSIS — R9431 Abnormal electrocardiogram [ECG] [EKG]: Secondary | ICD-10-CM | POA: Insufficient documentation

## 2017-03-25 NOTE — Patient Instructions (Signed)
Your physician has recommended you make the following change in your medication:  1)Stop diltiazem 2)Decrease Amiodarone to 200mg  once a day in 1 week.

## 2017-03-25 NOTE — Progress Notes (Signed)
Pt in for repeat EKG for start of amiodarone. Shows SR at 63 bpm, with qtc at 435 ms,  He converted to SR 12 hours after first dose. He will continue on 200 mg bid for next week then 200 mg a day. He is having some low BP's and will stop cardizem daily. He will have f/u here  in  3-4 weeks.

## 2017-03-29 ENCOUNTER — Ambulatory Visit (HOSPITAL_COMMUNITY): Payer: Medicare Other | Admitting: Nurse Practitioner

## 2017-04-05 ENCOUNTER — Ambulatory Visit (HOSPITAL_COMMUNITY): Payer: Medicare Other | Admitting: Nurse Practitioner

## 2017-04-08 ENCOUNTER — Telehealth (HOSPITAL_COMMUNITY): Payer: Self-pay | Admitting: *Deleted

## 2017-04-08 NOTE — Telephone Encounter (Signed)
Pt cld reporting HR consistently going up over the last week.  Today he had HRs in the 130s .  Per [name], NP go back up to amiodarone 200 mg bid for the next couple of days and call our office on Wednesday so that we can see sooner than his Monday appt if needed.  Pt understood to take extra dose tonight and continue BID dosing and call Wed.

## 2017-04-14 ENCOUNTER — Other Ambulatory Visit (HOSPITAL_COMMUNITY): Payer: Self-pay | Admitting: Nurse Practitioner

## 2017-04-15 ENCOUNTER — Encounter (HOSPITAL_COMMUNITY): Payer: Self-pay | Admitting: Nurse Practitioner

## 2017-04-15 ENCOUNTER — Ambulatory Visit (HOSPITAL_COMMUNITY)
Admission: RE | Admit: 2017-04-15 | Discharge: 2017-04-15 | Disposition: A | Discharge status: Home / Self Care | Payer: Medicare Other | Source: Ambulatory Visit | Attending: Nurse Practitioner | Admitting: Nurse Practitioner

## 2017-04-15 ENCOUNTER — Other Ambulatory Visit (HOSPITAL_COMMUNITY): Payer: Self-pay | Admitting: *Deleted

## 2017-04-15 VITALS — BP 108/72 | HR 88 | Ht 70.0 in | Wt 174.2 lb

## 2017-04-15 DIAGNOSIS — Z87891 Personal history of nicotine dependence: Secondary | ICD-10-CM | POA: Diagnosis not present

## 2017-04-15 DIAGNOSIS — Z9889 Other specified postprocedural states: Secondary | ICD-10-CM | POA: Diagnosis not present

## 2017-04-15 DIAGNOSIS — Z8546 Personal history of malignant neoplasm of prostate: Secondary | ICD-10-CM | POA: Insufficient documentation

## 2017-04-15 DIAGNOSIS — Z85828 Personal history of other malignant neoplasm of skin: Secondary | ICD-10-CM | POA: Insufficient documentation

## 2017-04-15 DIAGNOSIS — I4819 Other persistent atrial fibrillation: Secondary | ICD-10-CM

## 2017-04-15 DIAGNOSIS — I481 Persistent atrial fibrillation: Secondary | ICD-10-CM

## 2017-04-15 DIAGNOSIS — I4892 Unspecified atrial flutter: Secondary | ICD-10-CM | POA: Insufficient documentation

## 2017-04-15 LAB — COMPREHENSIVE METABOLIC PANEL
ALBUMIN: 4 g/dL (ref 3.5–5.0)
ALK PHOS: 81 U/L (ref 38–126)
ALT: 33 U/L (ref 17–63)
ANION GAP: 6 (ref 5–15)
AST: 33 U/L (ref 15–41)
BUN: 20 mg/dL (ref 6–20)
CALCIUM: 9.3 mg/dL (ref 8.9–10.3)
CHLORIDE: 106 mmol/L (ref 101–111)
CO2: 28 mmol/L (ref 22–32)
Creatinine, Ser: 1.29 mg/dL — ABNORMAL HIGH (ref 0.61–1.24)
GFR calc non Af Amer: 53 mL/min — ABNORMAL LOW (ref >60)
GLUCOSE: 74 mg/dL (ref 65–99)
Potassium: 3.9 mmol/L (ref 3.5–5.1)
SODIUM: 140 mmol/L (ref 135–145)
Total Bilirubin: 0.6 mg/dL (ref 0.3–1.2)
Total Protein: 6.6 g/dL (ref 6.5–8.1)

## 2017-04-15 LAB — T4, FREE: Free T4: 0.93 ng/dL (ref 0.61–1.12)

## 2017-04-15 LAB — TSH: TSH: 5.645 u[IU]/mL — ABNORMAL HIGH (ref 0.350–4.500)

## 2017-04-15 LAB — CBC
HCT: 42.1 % (ref 39.0–52.0)
HEMOGLOBIN: 14.3 g/dL (ref 13.0–17.0)
MCH: 30.6 pg (ref 26.0–34.0)
MCHC: 34 g/dL (ref 30.0–36.0)
MCV: 90.1 fL (ref 78.0–100.0)
PLATELETS: 235 10*3/uL (ref 150–400)
RBC: 4.67 MIL/uL (ref 4.22–5.81)
RDW: 13.3 % (ref 11.5–15.5)
WBC: 5.9 10*3/uL (ref 4.0–10.5)

## 2017-04-15 NOTE — Patient Instructions (Addendum)
Cardioversion scheduled for Friday, July 27th  - Arrive at the [facility] and go to admitting at 8:30AM  -Do not eat or drink anything after midnight the night prior to your procedure.  - Take all your medication with a sip of water prior to arrival.  - You will not be able to drive home after your procedure.  Continue Amiodarone at 200mg  twice a day for 1 week after cardioversion then reduce to once a day

## 2017-04-15 NOTE — Addendum Note (Signed)
Encounter addended by: Juluis Mire, RN on: 04/15/2017 11:35 AM<BR>    Actions taken: Order list changed

## 2017-04-15 NOTE — Progress Notes (Signed)
Primary Care Physician: [name], MD Referring Physician: Leul Narramore is a 71 y.o. male with a h/o rheumatic fever(no structural abnormalities), and atrial fibrillation, new onset in February with v rates around 100 bpm in PCP office. Afib was in the setting of 3 root canals with infection. He was then sent to Dr. Johnsie Cancel and plans were for DCCV but he converted spontaneously. He had return of afib in March. Echo showed EF 45-50% in afib with mildly dilated root. He was placed on xarelto with a chadsvasc score of 1. He was scheduled for a ETT and event monitor. He was placed on flecainide 50 mg bid on 4/2. He felt progressively worse while taking drug, with fatigue and dizziness and took himself off drug 5/8. Monitor has revealed SR with afib. Strips form 5/13 at 5:46 am showed a slow afib in the 30's with pauses up to 3.2 seconds, this was while pt was sleeping. Another slow afib epiosde with pauses was reported on 4/21 at 5:51 am.  Pt denies snoring/apnea. He is a retired Engineer, structural and enjoys running. He is disappointed that he has not been able to run for the last month with the afib condition. He does not drink, no tobacco.His daughter is a Psychologist, forensic. He does feel better off flecainide.  F/u in afib clinic, 6/11. He asked to be seen after feeling funny following ablation 6/5. He states that he was aware of a pressure in his chest over the weekend and heart felt like it was beating fast, felt short of breath. EKG today shows afib with rvr at 137 bpm. He is not on rate control due to brady and pauses seen on event monitor  prior to ablation.  F/u in afib clinic, 6/25. He had a cardioversion 6/14, but feels that he went back into afib the day after.He is able to exercise and has had fairly good rate control, but knew his heart rate numbers were higher since the cardioversion.   F/u in afib clinic 7/23, pt did return to afib following cardioversion and was placed on  amiodarone 200 mg bid x 2 weeks. He went into SR after one tab of amiodarone. However, he went back into afib with rvr. He was placed back on amiodarone at 200 mg bid. In the clinic today, he is in afib/flutter at 38 bpm.    Today, he denies symptoms of palpitations, chest pain, shortness of breath, orthopnea, PND, lower extremity edema, dizziness, presyncope, syncope, or neurologic sequela. The patient is tolerating medications without difficulties and is otherwise without complaint today.   Past Medical History:  Diagnosis Date  . Basal cell carcinoma 2009   back  . Cancer of prostate ([place])    pT2c No Mx, Gleason 3+4=7  . Chronic systolic dysfunction of left ventricle    EF 45% by echo 2/18  . Diverticulitis   . Hypogonadism male   . Kidney stones   . Left rotator cuff tear 07/10/2013  . Nephrolithiasis   . Paroxysmal atrial fibrillation (HCC)   . Rheumatic fever 1952-1953  . Rheumatic fever    HISTORY OF AT AGE 79   Past Surgical History:  Procedure Laterality Date  . ABLATION OF DYSRHYTHMIC FOCUS  02/26/2017  . ATRIAL FIBRILLATION ABLATION N/A 02/26/2017   Procedure: Atrial Fibrillation Ablation;  Surgeon: Thompson Grayer, MD;  Location: [place] CV LAB;  Service: Cardiovascular;  Laterality: N/A;  . bilateral inguinal hernia repair  07/10/2012   lap BIH  repairs  . CARDIOVERSION N/A 03/07/2017   Procedure: CARDIOVERSION;  Surgeon: Fay Records, MD;  Location: [facility] ENDOSCOPY;  Service: Cardiovascular;  Laterality: N/A;  . COLONOSCOPY    . HERNIA REPAIR  07/10/12   LIH/rih-umb  . KNEE SURGERY  2005   Duda- arthroscopy, partial medial menisectomy-lt  . PROSTATE SURGERY  12/19/06   robotic prostatectomy  . rotator cuff surgery  2002   Gioffre-rt  . SHOULDER ARTHROSCOPY WITH ROTATOR CUFF REPAIR AND SUBACROMIAL DECOMPRESSION Left 07/10/2013   Procedure: LEFT SHOULDER ARTHROSCOPY WITH ARTHROSCOPIC ROTATOR CUFF REPAIR AND SUBACROMIAL DECOMPRESSION, PARTIAL ACROMIOPLASTY WITH  CORACROMIAL RELEASE;  Surgeon: Johnny Bridge, MD;  Location: [place];  Service: Orthopedics;  Laterality: Left;    Current Outpatient Prescriptions  Medication Sig Dispense Refill  . amiodarone (PACERONE) 200 MG tablet Take 1 tablet (200 mg total) by mouth 2 (two) times daily. 60 tablet 0  . rivaroxaban (XARELTO) 20 MG TABS tablet Take 1 tablet (20 mg total) by mouth daily with supper. 30 tablet 6   No current facility-administered medications for this encounter.     Allergies  Allergen Reactions  . Flecainide Other (See Comments)    Dizziness and fatigue    Social History   Social History  . Marital status: Married    Spouse name: N/A  . Number of children: N/A  . Years of education: N/A   Occupational History  . retired Licensed conveyancer) Health visitor   Social History Main Topics  . Smoking status: Former Smoker    Quit date: 09/25/1971  . Smokeless tobacco: Never Used  . Alcohol use No  . Drug use: No  . Sexual activity: Not on file   Other Topics Concern  . Not on file   Social History Narrative   Regular exercise: yes   Runner, former marathon runner    Family History  Problem Relation Age of Onset  . Breast cancer Mother   . Cancer Brother 37       brain tumor  . Heart disease Unknown        fam hx  . Arthritis Other        other relative  . Prostate cancer Other        nephew  . Lung cancer Other 12       nephew  . Arrhythmia Father     ROS- All systems are reviewed and negative except as per the HPI above  Physical Exam: Vitals:   04/15/17 0829  BP: 108/72  Pulse: 88  Weight: 174 lb 3.2 oz (79 kg)  Height: 5\' 10"  (1.778 m)   Wt Readings from Last 3 Encounters:  04/15/17 174 lb 3.2 oz (79 kg)  03/18/17 173 lb 3.2 oz (78.6 kg)  03/07/17 171 lb (77.6 kg)    Labs: Lab Results  Component Value Date   NA 138 03/04/2017   K 4.9 03/04/2017   CL 102 03/04/2017   CO2 28 03/04/2017   GLUCOSE 102 (H) 03/04/2017    BUN 24 (H) 03/04/2017   CREATININE 1.08 03/04/2017   CALCIUM 9.8 03/04/2017   Lab Results  Component Value Date   INR 1.1 11/16/2016   Lab Results  Component Value Date   CHOL 209 (H) 06/25/2016   HDL 69.50 06/25/2016   LDLCALC 129 (H) 06/25/2016   TRIG 55.0 06/25/2016     GEN- The patient is well appearing, alert and oriented x 3 today.   Head- normocephalic, atraumatic Eyes-  Sclera clear, conjunctiva pink Ears- hearing intact Oropharynx- clear Neck- supple, no JVP Lymph- no cervical lymphadenopathy Lungs- Clear to ausculation bilaterally, normal work of breathing Heart- irregular rate and rhythm, no murmurs, rubs or gallops, PMI not laterally displaced GI- soft, NT, ND, + BS Extremities- no clubbing, cyanosis, or edema MS- no significant deformity or atrophy Skin- no rash or lesion Psych- euthymic mood, full affect Neuro- strength and sensation are intact  EKG-aflutter at 88 bpm, qrs int 96 ms, qtc 459 ms Epic records reviewed    Assessment and Plan:  1. afib/flutter S/p ablation 6/5 with ERAF Did not tolerate flecainide and is off drug since 5/8 Successful cardioversion but with ERAF one day later His is appropriately on xarelto 20 mg daily for a chadsvasc score of 1 Continue amiodarone at 200 mg bid Continue cardizem 120 mg daily  Will schedule cardioversion, states no missed doses of xarelto 20 mg daily Continue 200 mg bid of amiodarone for one week after cardioversion then reduce to one a day  F/u afib clinic one week after cardioversion   Butch Penny C. Jos Cygan, [facility] [address] [place], [place] [address] [phone]

## 2017-04-16 LAB — T3, FREE: T3, Free: 1.8 pg/mL — ABNORMAL LOW (ref 2.0–4.4)

## 2017-04-18 ENCOUNTER — Ambulatory Visit (HOSPITAL_COMMUNITY)
Admission: RE | Admit: 2017-04-18 | Discharge: 2017-04-18 | Disposition: A | Discharge status: Home / Self Care | Payer: Medicare Other | Source: Ambulatory Visit | Attending: Nurse Practitioner | Admitting: Nurse Practitioner

## 2017-04-18 DIAGNOSIS — I4891 Unspecified atrial fibrillation: Secondary | ICD-10-CM | POA: Diagnosis not present

## 2017-04-18 MED ORDER — AMIODARONE HCL 200 MG PO TABS: 200.0000 mg | ORAL_TABLET | Freq: Every day | ORAL | 2 refills | Status: DC

## 2017-04-18 NOTE — Progress Notes (Addendum)
Pt in for follow up EKG since he felt that he was back in rhythm.  EKG to be reviewed by [name], NP   Pt has returned to SR at 60 bpm, pr int 138ms, qrs int 78 ms, qtc 430 ms. and will not need cardioversion. He will reduce amiodarone to 200 mg one tab a day starting on Monday. TSH/T3 were slightly elevated recently and hopefully reducing amio will help tsh to normalize. Use of this drug is suppose to be short term thru healing period s/p ablation. He will have tsh/t3 repeated when he sees Dr. [name] in September.

## 2017-04-19 ENCOUNTER — Ambulatory Visit (HOSPITAL_COMMUNITY): Admission: RE | Admit: 2017-04-19 | Payer: Medicare Other | Source: Ambulatory Visit | Admitting: Cardiology

## 2017-04-19 ENCOUNTER — Encounter (HOSPITAL_COMMUNITY): Admission: RE | Payer: Self-pay | Source: Ambulatory Visit

## 2017-04-19 SURGERY — CARDIOVERSION
Anesthesia: General

## 2017-04-25 ENCOUNTER — Telehealth: Payer: Self-pay | Admitting: Family Medicine

## 2017-04-25 NOTE — Telephone Encounter (Signed)
Spoke with Mr. Derek [name].  He will be seeing his Cardiologist, Urologist and Dr. Lorelei Pont all in the up coming months.  They all need labs.  Patient would prefer to get labs all done here at [facility].  Will need thyroid labs rechecked for Dr. [name] and PSA for Dr. Alinda Money at [facility] urology.  Derek [name] is due for annual wellness in October with Dr. Lorelei Pont.  Fasting lab appointment scheduled for 05/20/2017 at 8:20 am.  Will forward to Dr. Lorelei Pont to order future labs for CPE to include thyroid and PSA.

## 2017-04-25 NOTE — Telephone Encounter (Signed)
Pt is requesting a call to discuss upcoming appointments. Concerned about having to do blood work for 2 other doctors. Out of town so he can only be reached on cell phone.

## 2017-04-28 NOTE — Telephone Encounter (Signed)
Help?  This is no issue for me. Makes sense to do 1 blood draw.   FLP, E78.5  Cbc with diff, HFP, BMET: Z79.899 PSA, Free and Total: history of prostate cancer TSH, Free T3, Free T4: Atrial fibrillation, z79.899

## 2017-04-29 ENCOUNTER — Other Ambulatory Visit: Payer: Self-pay | Admitting: Family Medicine

## 2017-04-29 DIAGNOSIS — Z8546 Personal history of malignant neoplasm of prostate: Secondary | ICD-10-CM

## 2017-04-29 DIAGNOSIS — Z79899 Other long term (current) drug therapy: Secondary | ICD-10-CM

## 2017-04-29 DIAGNOSIS — E785 Hyperlipidemia, unspecified: Secondary | ICD-10-CM

## 2017-04-29 NOTE — Telephone Encounter (Signed)
Tests ordered and LMOM to inform pt

## 2017-05-06 ENCOUNTER — Ambulatory Visit (HOSPITAL_COMMUNITY): Payer: Medicare Other | Admitting: Nurse Practitioner

## 2017-05-20 ENCOUNTER — Other Ambulatory Visit (INDEPENDENT_AMBULATORY_CARE_PROVIDER_SITE_OTHER): Payer: Medicare Other

## 2017-05-20 DIAGNOSIS — Z79899 Other long term (current) drug therapy: Secondary | ICD-10-CM | POA: Diagnosis not present

## 2017-05-20 DIAGNOSIS — E785 Hyperlipidemia, unspecified: Secondary | ICD-10-CM | POA: Diagnosis not present

## 2017-05-20 DIAGNOSIS — Z8546 Personal history of malignant neoplasm of prostate: Secondary | ICD-10-CM | POA: Diagnosis not present

## 2017-05-20 LAB — HEPATIC FUNCTION PANEL
ALBUMIN: 4 g/dL (ref 3.5–5.2)
ALK PHOS: 75 U/L (ref 39–117)
ALT: 20 U/L (ref 0–53)
AST: 23 U/L (ref 0–37)
Bilirubin, Direct: 0.1 mg/dL (ref 0.0–0.3)
Total Bilirubin: 0.4 mg/dL (ref 0.2–1.2)
Total Protein: 6.3 g/dL (ref 6.0–8.3)

## 2017-05-20 LAB — CBC WITH DIFFERENTIAL/PLATELET
BASOS PCT: 1 % (ref 0.0–3.0)
Basophils Absolute: 0.1 10*3/uL (ref 0.0–0.1)
EOS PCT: 2.9 % (ref 0.0–5.0)
Eosinophils Absolute: 0.1 10*3/uL (ref 0.0–0.7)
HEMATOCRIT: 43.2 % (ref 39.0–52.0)
HEMOGLOBIN: 14.6 g/dL (ref 13.0–17.0)
LYMPHS PCT: 25.8 % (ref 12.0–46.0)
Lymphs Abs: 1.3 10*3/uL (ref 0.7–4.0)
MCHC: 33.7 g/dL (ref 30.0–36.0)
MCV: 93.6 fl (ref 78.0–100.0)
MONOS PCT: 7.8 % (ref 3.0–12.0)
Monocytes Absolute: 0.4 10*3/uL (ref 0.1–1.0)
NEUTROS ABS: 3.1 10*3/uL (ref 1.4–7.7)
Neutrophils Relative %: 62.5 % (ref 43.0–77.0)
PLATELETS: 226 10*3/uL (ref 150.0–400.0)
RBC: 4.62 Mil/uL (ref 4.22–5.81)
RDW: 14.5 % (ref 11.5–15.5)
WBC: 4.9 10*3/uL (ref 4.0–10.5)

## 2017-05-20 LAB — T4, FREE: Free T4: 0.82 ng/dL (ref 0.60–1.60)

## 2017-05-20 LAB — BASIC METABOLIC PANEL
BUN: 21 mg/dL (ref 6–23)
CHLORIDE: 106 meq/L (ref 96–112)
CO2: 32 meq/L (ref 19–32)
CREATININE: 1.2 mg/dL (ref 0.40–1.50)
Calcium: 9.1 mg/dL (ref 8.4–10.5)
GFR: 63.03 mL/min (ref >60.00)
Glucose, Bld: 88 mg/dL (ref 70–99)
POTASSIUM: 4.4 meq/L (ref 3.5–5.1)
SODIUM: 141 meq/L (ref 135–145)

## 2017-05-20 LAB — T3, FREE: T3, Free: 3.7 pg/mL (ref 2.3–4.2)

## 2017-05-20 LAB — TSH: TSH: 4.34 u[IU]/mL (ref 0.35–4.50)

## 2017-05-20 LAB — LIPID PANEL
CHOL/HDL RATIO: 3
Cholesterol: 171 mg/dL (ref 0–200)
HDL: 57.7 mg/dL (ref >39.00)
LDL CALC: 102 mg/dL — AB (ref 0–99)
NonHDL: 113.14
TRIGLYCERIDES: 54 mg/dL (ref 0.0–149.0)
VLDL: 10.8 mg/dL (ref 0.0–40.0)

## 2017-05-21 LAB — PSA, TOTAL WITH REFLEX TO PSA, FREE: PSA, Total: 0.1 ng/mL (ref <=4.0)

## 2017-05-29 ENCOUNTER — Encounter: Payer: Self-pay | Admitting: Internal Medicine

## 2017-05-29 ENCOUNTER — Ambulatory Visit (INDEPENDENT_AMBULATORY_CARE_PROVIDER_SITE_OTHER): Payer: Medicare Other | Admitting: Internal Medicine

## 2017-05-29 VITALS — BP 108/74 | HR 62 | Ht 70.0 in | Wt 174.8 lb

## 2017-05-29 DIAGNOSIS — I480 Paroxysmal atrial fibrillation: Secondary | ICD-10-CM | POA: Diagnosis not present

## 2017-05-29 MED ORDER — AMIODARONE HCL 200 MG PO TABS: 100.0000 mg | ORAL_TABLET | Freq: Every day | ORAL | 2 refills | Status: DC

## 2017-05-29 NOTE — Patient Instructions (Addendum)
Medication Instructions:  Your physician has recommended you make the following change in your medication:  1) decrease Amiodarone to 100 mg for 30 days then stop   Labwork: None ordered   Testing/Procedures: None ordered   Follow-Up: Your physician recommends that you schedule a follow-up appointment in: 3 months with Dr [name]  Thank you for choosing [facility]!!     Janan Halter, RN [phone]

## 2017-05-29 NOTE — Addendum Note (Signed)
Addended by: Janan Halter F on: 05/29/2017 10:13 AM   Modules accepted: Orders

## 2017-05-29 NOTE — Progress Notes (Signed)
PCP: [name], MD Primary Cardiologist: Dr Yetta Flock is a 71 y.o. male who presents today for routine electrophysiology followup.  Since his recent afib ablation, the patient reports doing very well.  He did have ERAF for which he took amiodarone.  He has had no recent afib.  he denies procedure related complications and is pleased with the results of the procedure.  Today, he denies symptoms of palpitations, chest pain, shortness of breath,  lower extremity edema, dizziness, presyncope, or syncope.  The patient is otherwise without complaint today.   Past Medical History:  Diagnosis Date  . Basal cell carcinoma 2009   back  . Cancer of prostate ([place])    pT2c No Mx, Gleason 3+4=7  . Chronic systolic dysfunction of left ventricle    EF 45% by echo 2/18  . Diverticulitis   . Hypogonadism male   . Kidney stones   . Left rotator cuff tear 07/10/2013  . Nephrolithiasis   . Paroxysmal atrial fibrillation (HCC)   . Rheumatic fever 1952-1953  . Rheumatic fever    HISTORY OF AT AGE 42   Past Surgical History:  Procedure Laterality Date  . ABLATION OF DYSRHYTHMIC FOCUS  02/26/2017  . ATRIAL FIBRILLATION ABLATION N/A 02/26/2017   Procedure: Atrial Fibrillation Ablation;  Surgeon: Thompson Grayer, MD;  Location: [place] CV LAB;  Service: Cardiovascular;  Laterality: N/A;  . bilateral inguinal hernia repair  07/10/2012   lap BIH repairs  . CARDIOVERSION N/A 03/07/2017   Procedure: CARDIOVERSION;  Surgeon: Fay Records, MD;  Location: [facility] ENDOSCOPY;  Service: Cardiovascular;  Laterality: N/A;  . COLONOSCOPY    . HERNIA REPAIR  07/10/12   LIH/rih-umb  . KNEE SURGERY  2005   Duda- arthroscopy, partial medial menisectomy-lt  . PROSTATE SURGERY  12/19/06   robotic prostatectomy  . rotator cuff surgery  2002   Gioffre-rt  . SHOULDER ARTHROSCOPY WITH ROTATOR CUFF REPAIR AND SUBACROMIAL DECOMPRESSION Left 07/10/2013   Procedure: LEFT SHOULDER ARTHROSCOPY WITH ARTHROSCOPIC  ROTATOR CUFF REPAIR AND SUBACROMIAL DECOMPRESSION, PARTIAL ACROMIOPLASTY WITH CORACROMIAL RELEASE;  Surgeon: Johnny Bridge, MD;  Location: [place];  Service: Orthopedics;  Laterality: Left;    ROS- all systems are personally reviewed and negatives except as per HPI above  Current Outpatient Prescriptions  Medication Sig Dispense Refill  . amiodarone (PACERONE) 200 MG tablet Take 1 tablet (200 mg total) by mouth daily. 30 tablet 2  . rivaroxaban (XARELTO) 20 MG TABS tablet Take 1 tablet (20 mg total) by mouth daily with supper. 30 tablet 6   No current facility-administered medications for this visit.     Physical Exam: Vitals:   05/29/17 0922  BP: 108/74  Pulse: 62  SpO2: 97%  Weight: 174 lb 12.8 oz (79.3 kg)  Height: 5\' 10"  (1.778 m)    GEN- The patient is well appearing, alert and oriented x 3 today.   Head- normocephalic, atraumatic Eyes-  Sclera clear, conjunctiva pink Ears- hearing intact Oropharynx- clear Lungs- Clear to ausculation bilaterally, normal work of breathing Heart- Regular rate and rhythm, no murmurs, rubs or gallops, PMI not laterally displaced GI- soft, NT, ND, + BS Extremities- no clubbing, cyanosis, or edema  EKG tracing ordered today is personally reviewed and shows sinus rhythm  Assessment and Plan:  1. Paroxysmal atrial fibrillation Doing well s/p ablation chads2vasc score is 1-2 (EF 45%) Continue xarelto Consider repeat echo upon return to see if EF recovers  Reduce amiodarone to 100mg  daily  today Stop amiodarone if no afib in 30 days  Return to see me in 3 months  Thompson Grayer MD, [facility] 05/29/2017 9:37 AM

## 2017-06-10 ENCOUNTER — Telehealth: Payer: Self-pay | Admitting: Family Medicine

## 2017-06-10 DIAGNOSIS — D1801 Hemangioma of skin and subcutaneous tissue: Secondary | ICD-10-CM | POA: Diagnosis not present

## 2017-06-10 DIAGNOSIS — D225 Melanocytic nevi of trunk: Secondary | ICD-10-CM | POA: Diagnosis not present

## 2017-06-10 DIAGNOSIS — L720 Epidermal cyst: Secondary | ICD-10-CM | POA: Diagnosis not present

## 2017-06-10 DIAGNOSIS — D220 Melanocytic nevi of lip: Secondary | ICD-10-CM | POA: Diagnosis not present

## 2017-06-10 DIAGNOSIS — Z85828 Personal history of other malignant neoplasm of skin: Secondary | ICD-10-CM | POA: Diagnosis not present

## 2017-06-10 NOTE — Telephone Encounter (Signed)
Caller Name: Ahron Hulbert    Relationship to Patient: Self    Best Number:[phone]  Reason for call: Patient has an upcoming appointment with Dr. Raynelle Bring / [facility].  He is requesting most recent PSA and Labs to be faxed for Dr. Lynne Logan review.  Faxed 05/20/17 labs to [phone], [facility].

## 2017-06-19 DIAGNOSIS — N5201 Erectile dysfunction due to arterial insufficiency: Secondary | ICD-10-CM | POA: Diagnosis not present

## 2017-06-19 DIAGNOSIS — Z8546 Personal history of malignant neoplasm of prostate: Secondary | ICD-10-CM | POA: Diagnosis not present

## 2017-06-24 DIAGNOSIS — H04123 Dry eye syndrome of bilateral lacrimal glands: Secondary | ICD-10-CM | POA: Diagnosis not present

## 2017-06-24 DIAGNOSIS — Z961 Presence of intraocular lens: Secondary | ICD-10-CM | POA: Diagnosis not present

## 2017-07-05 ENCOUNTER — Ambulatory Visit (INDEPENDENT_AMBULATORY_CARE_PROVIDER_SITE_OTHER): Payer: Medicare Other

## 2017-07-05 VITALS — BP 102/78 | HR 63 | Temp 97.9°F | Ht 69.5 in | Wt 178.0 lb

## 2017-07-05 DIAGNOSIS — Z0000 Encounter for general adult medical examination without abnormal findings: Secondary | ICD-10-CM | POA: Diagnosis not present

## 2017-07-05 NOTE — Patient Instructions (Signed)
Mr. Hagger , Thank you for taking time to come for your Medicare Wellness Visit. I appreciate your ongoing commitment to your health goals. Please review the following plan we discussed and let me know if I can assist you in the future.   These are the goals we discussed: Goals    . Increase physical activity          Starting 07/05/17, I will continue to exercise for at least 70 minutes 4-5 days per week.       This is a list of the screening recommended for you and due dates:  Health Maintenance  Topic Date Due  . Flu Shot  12/22/2017*  . Colon Cancer Screening  12/17/2022  . Tetanus Vaccine  12/21/2024  .  Hepatitis C: One time screening is recommended by Center for Disease Control  (CDC) for  adults born from [birth year] through 1967.   Completed  . Pneumonia vaccines  Completed  *Topic was postponed. The date shown is not the original due date.   Preventive Care for Adults  A healthy lifestyle and preventive care can promote health and wellness. Preventive health guidelines for adults include the following key practices.  . A routine yearly physical is a good way to check with your health care provider about your health and preventive screening. It is a chance to share any concerns and updates on your health and to receive a thorough exam.  . Visit your dentist for a routine exam and preventive care every 6 months. Brush your teeth twice a day and floss once a day. Good oral hygiene prevents tooth decay and gum disease.  . The frequency of eye exams is based on your age, health, family medical history, use  of contact lenses, and other factors. Follow your health care provider's ecommendations for frequency of eye exams.  . Eat a healthy diet. Foods like vegetables, fruits, whole grains, low-fat dairy products, and lean protein foods contain the nutrients you need without too many calories. Decrease your intake of foods high in solid fats, added sugars, and salt. Eat the right amount  of calories for you. Get information about a proper diet from your health care provider, if necessary.  . Regular physical exercise is one of the most important things you can do for your health. Most adults should get at least 150 minutes of moderate-intensity exercise (any activity that increases your heart rate and causes you to sweat) each week. In addition, most adults need muscle-strengthening exercises on 2 or more days a week.  Silver Sneakers may be a benefit available to you. To determine eligibility, you may visit the website: www.silversneakers.com or contact program at (838) 179-6878 Mon-Fri between 8AM-8PM.   . Maintain a healthy weight. The body mass index (BMI) is a screening tool to identify possible weight problems. It provides an estimate of body fat based on height and weight. Your health care provider can find your BMI and can help you achieve or maintain a healthy weight.   For adults 20 years and older: ? A BMI below 18.5 is considered underweight. ? A BMI of 18.5 to 24.9 is normal. ? A BMI of 25 to 29.9 is considered overweight. ? A BMI of 30 and above is considered obese.   . Maintain normal blood lipids and cholesterol levels by exercising and minimizing your intake of saturated fat. Eat a balanced diet with plenty of fruit and vegetables. Blood tests for lipids and cholesterol should begin at age 59 and  be repeated every 5 years. If your lipid or cholesterol levels are high, you are over 50, or you are at high risk for heart disease, you may need your cholesterol levels checked more frequently. Ongoing high lipid and cholesterol levels should be treated with medicines if diet and exercise are not working.  . If you smoke, find out from your health care provider how to quit. If you do not use tobacco, please do not start.  . If you choose to drink alcohol, please do not consume more than 2 drinks per day. One drink is considered to be 12 ounces (355 mL) of beer, 5 ounces  (148 mL) of wine, or 1.5 ounces (44 mL) of liquor.  . If you are 66-54 years old, ask your health care provider if you should take aspirin to prevent strokes.  . Use sunscreen. Apply sunscreen liberally and repeatedly throughout the day. You should seek shade when your shadow is shorter than you. Protect yourself by wearing long sleeves, pants, a wide-brimmed hat, and sunglasses year round, whenever you are outdoors.  . Once a month, do a whole body skin exam, using a mirror to look at the skin on your back. Tell your health care provider of new moles, moles that have irregular borders, moles that are larger than a pencil eraser, or moles that have changed in shape or color.

## 2017-07-05 NOTE — Progress Notes (Signed)
Pre visit review using our clinic review tool, if applicable. No additional management support is needed unless otherwise documented below in the visit note. 

## 2017-07-05 NOTE — Progress Notes (Signed)
Subjective:   Derek [name] is a 71 y.o. male who presents for Medicare Annual/Subsequent preventive examination.  Review of Systems:  N/A Cardiac Risk Factors include: advanced age (>54men, >8 women);male gender     Objective:    Vitals: BP 102/78 (BP Location: Right Arm, Patient Position: Sitting, Cuff Size: Normal)   Pulse 63   Temp 97.9 F (36.6 C) (Oral)   Ht 5' 9.5" (1.765 m) Comment: shoes  Wt 178 lb (80.7 kg)   SpO2 98%   BMI 25.91 kg/m   Body mass index is 25.91 kg/m.  Tobacco History  Smoking Status  . Former Smoker  . Quit date: 09/25/1971  Smokeless Tobacco  . Never Used     Counseling given: No   Past Medical History:  Diagnosis Date  . Basal cell carcinoma 2009   back  . Cancer of prostate ([place])    pT2c No Mx, Gleason 3+4=7  . Chronic systolic dysfunction of left ventricle    EF 45% by echo 2/18  . Diverticulitis   . Hypogonadism male   . Kidney stones   . Left rotator cuff tear 07/10/2013  . Nephrolithiasis   . Paroxysmal atrial fibrillation (HCC)   . Rheumatic fever 1952-1953  . Rheumatic fever    HISTORY OF AT AGE 6   Past Surgical History:  Procedure Laterality Date  . ABLATION OF DYSRHYTHMIC FOCUS  02/26/2017  . ATRIAL FIBRILLATION ABLATION N/A 02/26/2017   Procedure: Atrial Fibrillation Ablation;  Surgeon: Thompson Grayer, MD;  Location: [facility];  Service: Cardiovascular;  Laterality: N/A;  . bilateral inguinal hernia repair  07/10/2012   lap BIH repairs  . CARDIOVERSION N/A 03/07/2017   Procedure: CARDIOVERSION;  Surgeon: Fay Records, MD;  Location: [facility] ENDOSCOPY;  Service: Cardiovascular;  Laterality: N/A;  . COLONOSCOPY    . HERNIA REPAIR  07/10/12   LIH/rih-umb  . KNEE SURGERY  2005   Duda- arthroscopy, partial medial menisectomy-lt  . PROSTATE SURGERY  12/19/06   robotic prostatectomy  . rotator cuff surgery  2002   Gioffre-rt  . SHOULDER ARTHROSCOPY WITH ROTATOR CUFF REPAIR AND SUBACROMIAL DECOMPRESSION Left  07/10/2013   Procedure: LEFT SHOULDER ARTHROSCOPY WITH ARTHROSCOPIC ROTATOR CUFF REPAIR AND SUBACROMIAL DECOMPRESSION, PARTIAL ACROMIOPLASTY WITH CORACROMIAL RELEASE;  Surgeon: Johnny Bridge, MD;  Location: [facility];  Service: Orthopedics;  Laterality: Left;   Family History  Problem Relation Age of Onset  . Breast cancer Mother   . Cancer Brother 75       brain tumor  . Heart disease Unknown        fam hx  . Arthritis Other        other relative  . Prostate cancer Other        nephew  . Lung cancer Other 69       nephew  . Arrhythmia Father    History  Sexual Activity  . Sexual activity: Not on file    Outpatient Encounter Prescriptions as of 07/05/2017  Medication Sig  . rivaroxaban (XARELTO) 20 MG TABS tablet Take 1 tablet (20 mg total) by mouth daily with supper.  . [DISCONTINUED] amiodarone (PACERONE) 200 MG tablet Take 0.5 tablets (100 mg total) by mouth daily.   No facility-administered encounter medications on file as of 07/05/2017.     Activities of Daily Living In your present state of health, do you have any difficulty performing the following activities: 07/05/2017 02/26/2017  Hearing? N -  Vision? N -  Difficulty concentrating or making decisions? N -  Walking or climbing stairs? N -  Dressing or bathing? N -  Doing errands, shopping? N N  Preparing Food and eating ? N -  Using the Toilet? N -  In the past six months, have you accidently leaked urine? N -  Do you have problems with loss of bowel control? N -  Managing your Medications? N -  Managing your Finances? N -  Housekeeping or managing your Housekeeping? N -  Some recent data might be hidden    Patient Care Team: [name], MD as PCP - General Raynelle Bring, MD as Consulting Physician (Urology) Danella Sensing, MD as Consulting Physician (Dermatology) Rutherford Guys, MD as Consulting Physician (Ophthalmology) Thompson Grayer, MD as Consulting Physician  (Cardiology) Javier Docker. Dalbert Mayotte., DMD as Referring Physician (Dentistry)   Assessment:     Hearing Screening   125Hz  250Hz  500Hz  1000Hz  2000Hz  3000Hz  4000Hz  6000Hz  8000Hz   Right ear:   40 40 40  40    Left ear:   40 40 40  0    Vision Screening Comments: Last vision exam in Oct 2018 with Dr. Gershon Crane   Exercise Activities and Dietary recommendations Current Exercise Habits: Home exercise routine, Type of exercise: strength training/weights;treadmill;Other - see comments (running), Time (Minutes): > 60 (70 min), Frequency (Times/Week): 5, Weekly Exercise (Minutes/Week): 0, Intensity: Moderate, Exercise limited by: None identified  Goals    . Increase physical activity          Starting 07/05/17, I will continue to exercise for at least 70 minutes 4-5 days per week.      Fall Risk Fall Risk  07/05/2017 07/02/2016 06/20/2015 06/16/2014 04/15/2013  Falls in the past year? No No No No No   Depression Screen PHQ 2/9 Scores 07/05/2017 07/02/2016 06/20/2015 06/16/2014  PHQ - 2 Score 0 0 0 0  PHQ- 9 Score 0 - - -    Cognitive Function MMSE - Mini Mental State Exam 07/05/2017  Orientation to time 5  Orientation to Place 5  Registration 3  Attention/ Calculation 0  Recall 3  Language- name 2 objects 0  Language- repeat 1  Language- follow 3 step command 3  Language- read & follow direction 0  Write a sentence 0  Copy design 0  Total score 20     PLEASE NOTE: A Mini-Cog screen was completed. Maximum score is 20. A value of 0 denotes this part of [place] MMSE was not completed or the patient failed this part of the Mini-Cog screening.   Mini-Cog Screening Orientation to Time - Max 5 pts Orientation to Place - Max 5 pts Registration - Max 3 pts Recall - Max 3 pts Language Repeat - Max 1 pts Language Follow 3 Step Command - Max 3 pts     Immunization History  Administered Date(s) Administered  . Influenza,inj,Quad PF,6+ Mos 06/25/2013, 06/16/2014, 07/21/2015, 07/02/2016  .  Pneumococcal Conjugate-13 05/03/2015  . Pneumococcal Polysaccharide-23 08/14/2006, 06/25/2013  . Tdap 12/22/2014  . Zoster 08/14/2006   Screening Tests Health Maintenance  Topic Date Due  . INFLUENZA VACCINE  12/22/2017 (Originally 04/24/2017)  . COLONOSCOPY  12/17/2022  . TETANUS/TDAP  12/21/2024  . Hepatitis C Screening  Completed  . PNA vac Low Risk Adult  Completed      Plan:  I have personally reviewed and addressed the Medicare Annual Wellness questionnaire and have noted the following in the patient's chart:  A. Medical and social history B. Use of alcohol,  tobacco or illicit drugs  C. Current medications and supplements D. Functional ability and status E.  Nutritional status F.  Physical activity G. Advance directives H. List of other physicians I.  Hospitalizations, surgeries, and ER visits in previous 12 months J.  [place] to include hearing, vision, cognitive, depression L. Referrals and appointments - none  In addition, I have reviewed and discussed with patient certain preventive protocols, quality metrics, and best practice recommendations. A written personalized care plan for preventive services as well as general preventive health recommendations were provided to patient.  See attached scanned questionnaire for additional information.   Signed,   Lindell Noe, MHA, BS, LPN Health Coach

## 2017-07-05 NOTE — Progress Notes (Signed)
PCP notes:   Health maintenance:  Flu vaccine - addressed; pt will obtain at future date  Abnormal screenings:   Hearing - failed  Hearing Screening   125Hz  250Hz  500Hz  1000Hz  2000Hz  3000Hz  4000Hz  6000Hz  8000Hz   Right ear:   40 40 40  40    Left ear:   40 40 40  0     Patient concerns:   None  Nurse concerns:  None  Next PCP appt:   07/08/17 @ 830

## 2017-07-05 NOTE — Progress Notes (Signed)
I reviewed health advisor's note, was available for consultation, and agree with documentation and plan.  

## 2017-07-08 ENCOUNTER — Ambulatory Visit: Payer: Medicare Other | Admitting: Family Medicine

## 2017-07-22 ENCOUNTER — Encounter: Payer: Self-pay | Admitting: Family Medicine

## 2017-07-22 ENCOUNTER — Telehealth: Payer: Self-pay | Admitting: *Deleted

## 2017-07-22 ENCOUNTER — Ambulatory Visit (INDEPENDENT_AMBULATORY_CARE_PROVIDER_SITE_OTHER): Payer: Medicare Other | Admitting: Family Medicine

## 2017-07-22 VITALS — BP 100/68 | HR 63 | Temp 97.7°F | Ht 69.5 in | Wt 178.5 lb

## 2017-07-22 DIAGNOSIS — I480 Paroxysmal atrial fibrillation: Secondary | ICD-10-CM | POA: Diagnosis not present

## 2017-07-22 DIAGNOSIS — Z23 Encounter for immunization: Secondary | ICD-10-CM

## 2017-07-22 DIAGNOSIS — Z8546 Personal history of malignant neoplasm of prostate: Secondary | ICD-10-CM

## 2017-07-22 NOTE — Telephone Encounter (Signed)
Copied from [place] [phone]. Topic: General - Other >> Jul 22, 2017  1:00 PM Ahmed Prima L wrote: Pt just left the DR and is wanting to know if he needs a booster for a shingles vaccination or a pneumonia shot. Please call him back with the info  Spoke with Mr. Ehrman and advised him he is up to date on flu and pneumonia vaccines.  I informed him of the new Shingrix vaccine and that he would have to get that done at a pharmacy but it is recommended.  I also advised I would wait 30 days since he just got his flu vaccine today.

## 2017-07-22 NOTE — Progress Notes (Signed)
Dr. Frederico Hamman T. Maydelin Deming, MD, [facility] [place], [address] Phone: [phone] Fax: [phone]  07/22/2017  Patient: Derek [name], MRN: [id number], DOB: Jul 05, 1946, 71 y.o.  Primary Physician:  [name], MD   Chief Complaint  Patient presents with  . Annual Exam    Part 2   Subjective:   MICAHEL [name] is a 71 y.o. very pleasant male patient who presents with the following:  AF: now on Xarelto.  Asymptomatic, with close f/u with Dr. [name].   H/o prostate cancer, PSA < 0.1  O/w he is doing well  Past Medical History, Surgical History, Social History, Family History, Problem List, Medications, and Allergies have been reviewed and updated if relevant.  Patient Active Problem List   Diagnosis Date Noted  . A-fib ([place]) 02/26/2017  . Atrial fibrillation ([place]) 10/30/2016  . Left rotator cuff tear 07/10/2013  . Scrotal discoloration / purpling ?varicocele 09/08/2012  . Bilateral inguinal hernia (BIH) s/p lap repair 07/06/2012 06/24/2012  . PROSTATE CANCER, HX OF 11/10/2008  . CARCINOMA, BASAL CELL, HX OF 11/10/2008  . RHEUMATIC FEVER, HX OF 11/10/2008  . DIVERTICULITIS, HX OF 11/10/2008    Past Medical History:  Diagnosis Date  . Basal cell carcinoma 2009   back  . Cancer of prostate ([place])    pT2c No Mx, Gleason 3+4=7  . Chronic systolic dysfunction of left ventricle    EF 45% by echo 2/18  . Diverticulitis   . Hypogonadism male   . Kidney stones   . Left rotator cuff tear 07/10/2013  . Nephrolithiasis   . Paroxysmal atrial fibrillation (HCC)   . Rheumatic fever 1952-1953  . Rheumatic fever    HISTORY OF AT AGE 25    Past Surgical History:  Procedure Laterality Date  . ABLATION OF DYSRHYTHMIC FOCUS  02/26/2017  . ATRIAL FIBRILLATION ABLATION N/A 02/26/2017   Procedure: Atrial Fibrillation Ablation;  Surgeon: Thompson Grayer, MD;  Location: [facility];  Service: Cardiovascular;   Laterality: N/A;  . bilateral inguinal hernia repair  07/10/2012   lap BIH repairs  . CARDIOVERSION N/A 03/07/2017   Procedure: CARDIOVERSION;  Surgeon: Fay Records, MD;  Location: [facility] ENDOSCOPY;  Service: Cardiovascular;  Laterality: N/A;  . COLONOSCOPY    . HERNIA REPAIR  07/10/12   LIH/rih-umb  . KNEE SURGERY  2005   Duda- arthroscopy, partial medial menisectomy-lt  . PROSTATE SURGERY  12/19/06   robotic prostatectomy  . rotator cuff surgery  2002   Gioffre-rt  . SHOULDER ARTHROSCOPY WITH ROTATOR CUFF REPAIR AND SUBACROMIAL DECOMPRESSION Left 07/10/2013   Procedure: LEFT SHOULDER ARTHROSCOPY WITH ARTHROSCOPIC ROTATOR CUFF REPAIR AND SUBACROMIAL DECOMPRESSION, PARTIAL ACROMIOPLASTY WITH CORACROMIAL RELEASE;  Surgeon: Johnny Bridge, MD;  Location: [facility];  Service: Orthopedics;  Laterality: Left;    Social History   Social History  . Marital status: Married    Spouse name: N/A  . Number of children: N/A  . Years of education: N/A   Occupational History  . retired Licensed conveyancer) Health visitor   Social History Main Topics  . Smoking status: Former Smoker    Quit date: 09/25/1971  . Smokeless tobacco: Never Used  . Alcohol use No  . Drug use: No  . Sexual activity: Not on file   Other Topics Concern  . Not on file   Social History Narrative   Regular exercise: yes   Scherrie November, former marathon runner  Family History  Problem Relation Age of Onset  . Breast cancer Mother   . Cancer Brother 32       brain tumor  . Heart disease Unknown        fam hx  . Arthritis Other        other relative  . Prostate cancer Other        nephew  . Lung cancer Other 17       nephew  . Arrhythmia Father     Allergies  Allergen Reactions  . Flecainide Other (See Comments)    Dizziness and fatigue    Medication list reviewed and updated in full in [place].   GEN: No acute illnesses, no fevers, chills. GI: No n/v/d, eating normally Pulm:  No SOB Interactive and getting along well at home.  Otherwise, ROS is as per the HPI.  Objective:   BP 100/68   Pulse 63   Temp 97.7 F (36.5 C) (Oral)   Ht 5' 9.5" (1.765 m)   Wt 178 lb 8 oz (81 kg)   BMI 25.98 kg/m   GEN: WDWN, NAD, Non-toxic, A & O x 3 HEENT: Atraumatic, Normocephalic. Neck supple. No masses, No LAD. Ears and Nose: No external deformity. CV: RRR, No M/G/R. No JVD. No thrill. No extra heart sounds. PULM: CTA B, no wheezes, crackles, rhonchi. No retractions. No resp. distress. No accessory muscle use. EXTR: No c/c/e NEURO Normal gait.  PSYCH: Normally interactive. Conversant. Not depressed or anxious appearing.  Calm demeanor.   Laboratory and Imaging Data: Results for orders placed or performed in visit on 05/20/17  Lipid panel  Result Value Ref Range   Cholesterol 171 0 - 200 mg/dL   Triglycerides 54.0 0.0 - 149.0 mg/dL   HDL 57.70 >39.00 mg/dL   VLDL 10.8 0.0 - 40.0 mg/dL   LDL Cholesterol 102 (H) 0 - 99 mg/dL   Total CHOL/HDL Ratio 3    NonHDL 113.14   Hepatic function panel  Result Value Ref Range   Total Bilirubin 0.4 0.2 - 1.2 mg/dL   Bilirubin, Direct 0.1 0.0 - 0.3 mg/dL   Alkaline Phosphatase 75 39 - 117 U/L   AST 23 0 - 37 U/L   ALT 20 0 - 53 U/L   Total Protein 6.3 6.0 - 8.3 g/dL   Albumin 4.0 3.5 - 5.2 g/dL  Basic metabolic panel  Result Value Ref Range   Sodium 141 135 - 145 mEq/L   Potassium 4.4 3.5 - 5.1 mEq/L   Chloride 106 96 - 112 mEq/L   CO2 32 19 - 32 mEq/L   Glucose, Bld 88 70 - 99 mg/dL   BUN 21 6 - 23 mg/dL   Creatinine, Ser 1.20 0.40 - 1.50 mg/dL   Calcium 9.1 8.4 - 10.5 mg/dL   GFR 63.03 >60.00 mL/min  CBC with Differential/Platelet  Result Value Ref Range   WBC 4.9 4.0 - 10.5 K/uL   RBC 4.62 4.22 - 5.81 Mil/uL   Hemoglobin 14.6 13.0 - 17.0 g/dL   HCT 43.2 39.0 - 52.0 %   MCV 93.6 78.0 - 100.0 fl   MCHC 33.7 30.0 - 36.0 g/dL   RDW 14.5 11.5 - 15.5 %   Platelets 226.0 150.0 - 400.0 K/uL   Neutrophils Relative  % 62.5 43.0 - 77.0 %   Lymphocytes Relative 25.8 12.0 - 46.0 %   Monocytes Relative 7.8 3.0 - 12.0 %   Eosinophils Relative 2.9 0.0 - 5.0 %  Basophils Relative 1.0 0.0 - 3.0 %   Neutro Abs 3.1 1.4 - 7.7 K/uL   Lymphs Abs 1.3 0.7 - 4.0 K/uL   Monocytes Absolute 0.4 0.1 - 1.0 K/uL   Eosinophils Absolute 0.1 0.0 - 0.7 K/uL   Basophils Absolute 0.1 0.0 - 0.1 K/uL  PSA, Total with Reflex to PSA, Free  Result Value Ref Range   PSA, Total 0.1 <=4.0 ng/mL  T3, free  Result Value Ref Range   T3, Free 3.7 2.3 - 4.2 pg/mL  T4, free  Result Value Ref Range   Free T4 0.82 0.60 - 1.60 ng/dL  TSH  Result Value Ref Range   TSH 4.34 0.35 - 4.50 uIU/mL     Assessment and Plan:   Paroxysmal atrial fibrillation (HCC)  Need for prophylactic vaccination and inoculation against influenza - Plan: Flu Vaccine QUAD 36+ mos IM  PROSTATE CANCER, HX OF   Overall doing well, reviewed all labs  Follow-up: No Follow-up on file.  Future Appointments Date Time Provider [place]  08/28/2017 9:45 AM Thompson Grayer, MD [facility]   Orders Placed This Encounter  Procedures  . Flu Vaccine QUAD 36+ mos IM    Signed,  Analeise Mccleery T. Modene Andy, MD   Allergies as of 07/22/2017      Reactions   Flecainide Other (See Comments)   Dizziness and fatigue      Medication List       Accurate as of 07/22/17  9:37 AM. Always use your most recent med list.          rivaroxaban 20 MG Tabs tablet Commonly known as:  XARELTO Take 1 tablet (20 mg total) by mouth daily with supper.

## 2017-07-24 ENCOUNTER — Other Ambulatory Visit: Payer: Self-pay | Admitting: Cardiovascular Disease

## 2017-08-21 ENCOUNTER — Encounter: Payer: Self-pay | Admitting: Internal Medicine

## 2017-08-28 ENCOUNTER — Ambulatory Visit (INDEPENDENT_AMBULATORY_CARE_PROVIDER_SITE_OTHER): Payer: Medicare Other | Admitting: Internal Medicine

## 2017-08-28 VITALS — BP 128/72 | HR 72 | Ht 69.5 in | Wt 181.0 lb

## 2017-08-28 DIAGNOSIS — I480 Paroxysmal atrial fibrillation: Secondary | ICD-10-CM | POA: Diagnosis not present

## 2017-08-28 MED ORDER — RIVAROXABAN 20 MG PO TABS: 20.0000 mg | ORAL_TABLET | Freq: Every day | ORAL | 10 refills | Status: DC

## 2017-08-28 NOTE — Progress Notes (Signed)
   PCP: [name], MD Primary Cardiologist: Dr Johnsie Cancel Primary EP: Dr Jinny Sanders is a 71 y.o. male who presents today for routine electrophysiology followup.  Since last being seen in our clinic, the patient reports doing very well.  Today, he denies symptoms of palpitations, chest pain, shortness of breath,  lower extremity edema, dizziness, presyncope, or syncope.  The patient is otherwise without complaint today.   Past Medical History:  Diagnosis Date  . Basal cell carcinoma 2009   back  . Cancer of prostate ([place])    pT2c No Mx, Gleason 3+4=7  . Chronic systolic dysfunction of left ventricle    EF 45% by echo 2/18  . Diverticulitis   . Hypogonadism male   . Kidney stones   . Left rotator cuff tear 07/10/2013  . Nephrolithiasis   . Paroxysmal atrial fibrillation (HCC)   . Rheumatic fever 1952-1953   Past Surgical History:  Procedure Laterality Date  . ABLATION OF DYSRHYTHMIC FOCUS  02/26/2017  . ATRIAL FIBRILLATION ABLATION N/A 02/26/2017   Procedure: Atrial Fibrillation Ablation;  Surgeon: Thompson Grayer, MD;  Location: [facility];  Service: Cardiovascular;  Laterality: N/A;  . bilateral inguinal hernia repair  07/10/2012   lap BIH repairs  . CARDIOVERSION N/A 03/07/2017   Procedure: CARDIOVERSION;  Surgeon: Fay Records, MD;  Location: [facility] ENDOSCOPY;  Service: Cardiovascular;  Laterality: N/A;  . COLONOSCOPY    . HERNIA REPAIR  07/10/12   LIH/rih-umb  . KNEE SURGERY  2005   Duda- arthroscopy, partial medial menisectomy-lt  . PROSTATE SURGERY  12/19/06   robotic prostatectomy  . rotator cuff surgery  2002   Gioffre-rt  . SHOULDER ARTHROSCOPY WITH ROTATOR CUFF REPAIR AND SUBACROMIAL DECOMPRESSION Left 07/10/2013   Procedure: LEFT SHOULDER ARTHROSCOPY WITH ARTHROSCOPIC ROTATOR CUFF REPAIR AND SUBACROMIAL DECOMPRESSION, PARTIAL ACROMIOPLASTY WITH CORACROMIAL RELEASE;  Surgeon: Johnny Bridge, MD;  Location: [facility];  Service:  Orthopedics;  Laterality: Left;    ROS- all systems are reviewed and negatives except as per HPI above  Current Outpatient Medications  Medication Sig Dispense Refill  . XARELTO 20 MG TABS tablet TAKE 1 TABLET (20 MG TOTAL) BY MOUTH DAILY WITH SUPPER. 30 tablet 10   No current facility-administered medications for this visit.     Physical Exam: Vitals:   08/28/17 0955  BP: 128/72  Pulse: 72  SpO2: 97%  Weight: 181 lb (82.1 kg)  Height: 5' 9.5" (1.765 m)    GEN- The patient is well appearing, alert and oriented x 3 today.   Head- normocephalic, atraumatic Eyes-  Sclera clear, conjunctiva pink Ears- hearing intact Oropharynx- clear Lungs- Clear to ausculation bilaterally, normal work of breathing Heart- Regular rate and rhythm, no murmurs, rubs or gallops, PMI not laterally displaced GI- soft, NT, ND, + BS Extremities- no clubbing, cyanosis, or edema  EKG tracing ordered today is personally reviewed and shows sinus rhythm, otherwise normal ekg  Assessment and Plan:  1. Paroxysmal atrial fibrillation Maintaining sinus rhythm post ablation off AAD therapy Continue on xarelto for chads2vasc score 1-2.  Repeat echo.  If EF has normalized, could consider stopping xarelto in the future.    Return to see me in 4 months  Thompson Grayer MD, [facility] 08/28/2017 10:12 AM

## 2017-08-28 NOTE — Patient Instructions (Signed)
Medication Instructions:  Your physician recommends that you continue on your current medications as directed. Please refer to the Current Medication list given to you today.   Labwork: None ordered   Testing/Procedures: Your physician has requested that you have an echocardiogram. Echocardiography is a painless test that uses sound waves to create images of your heart. It provides your doctor with information about the size and shape of your heart and how well your heart's chambers and valves are working. This procedure takes approximately one hour. There are no restrictions for this procedure.    Follow-Up: Your physician recommends that you schedule a follow-up appointment in: 4 months with Dr [name]   Any Other Special Instructions Will Be Listed Below (If Applicable).     If you need a refill on your cardiac medications before your next appointment, please call your pharmacy.

## 2017-09-10 ENCOUNTER — Ambulatory Visit (HOSPITAL_COMMUNITY): Payer: Medicare Other | Attending: Cardiology

## 2017-09-10 ENCOUNTER — Other Ambulatory Visit: Payer: Self-pay

## 2017-09-10 DIAGNOSIS — I480 Paroxysmal atrial fibrillation: Secondary | ICD-10-CM | POA: Diagnosis not present

## 2017-09-10 DIAGNOSIS — I509 Heart failure, unspecified: Secondary | ICD-10-CM | POA: Diagnosis not present

## 2017-09-10 DIAGNOSIS — Z8249 Family history of ischemic heart disease and other diseases of the circulatory system: Secondary | ICD-10-CM | POA: Diagnosis not present

## 2017-09-10 DIAGNOSIS — I4891 Unspecified atrial fibrillation: Secondary | ICD-10-CM | POA: Diagnosis not present

## 2017-09-10 DIAGNOSIS — C61 Malignant neoplasm of prostate: Secondary | ICD-10-CM | POA: Diagnosis not present

## 2017-09-10 DIAGNOSIS — Z87891 Personal history of nicotine dependence: Secondary | ICD-10-CM | POA: Insufficient documentation

## 2017-09-13 ENCOUNTER — Telehealth: Payer: Self-pay | Admitting: *Deleted

## 2017-09-13 NOTE — Telephone Encounter (Signed)
-----   Message from Thompson Grayer, MD sent at 09/12/2017 12:09 AM EST ----- Results reviewed.  Claiborne Billings, please inform pt of result. I will route to primary care also.

## 2017-09-13 NOTE — Telephone Encounter (Signed)
PT AWARE OF RESULTS AND EXPLAINED HOW EF HAS INCREASED TO A BETTER PUMPING FUNCTION PERCENTAGE SINCE ABALATION

## 2017-12-04 ENCOUNTER — Encounter: Payer: Self-pay | Admitting: Internal Medicine

## 2017-12-04 ENCOUNTER — Ambulatory Visit (INDEPENDENT_AMBULATORY_CARE_PROVIDER_SITE_OTHER): Payer: Medicare Other | Admitting: Internal Medicine

## 2017-12-04 VITALS — BP 126/72 | HR 65 | Ht 69.5 in | Wt 181.0 lb

## 2017-12-04 DIAGNOSIS — I480 Paroxysmal atrial fibrillation: Secondary | ICD-10-CM

## 2017-12-04 NOTE — Progress Notes (Signed)
   PCP: [name], MD Primary Cardiologist: Dr Johnsie Cancel Primary EP: Dr Jinny Sanders is a 72 y.o. male who presents today for routine electrophysiology followup.  Since last being seen in our clinic, the patient reports doing very well.  Today, he denies symptoms of palpitations, chest pain, shortness of breath,  lower extremity edema, dizziness, presyncope, or syncope.  The patient is otherwise without complaint today.   Past Medical History:  Diagnosis Date  . Basal cell carcinoma 2009   back  . Cancer of prostate ([place])    pT2c No Mx, Gleason 3+4=7  . Chronic systolic dysfunction of left ventricle    EF 45% by echo 2/18  . Diverticulitis   . Hypogonadism male   . Kidney stones   . Left rotator cuff tear 07/10/2013  . Nephrolithiasis   . Paroxysmal atrial fibrillation (HCC)   . Rheumatic fever 1952-1953   Past Surgical History:  Procedure Laterality Date  . ABLATION OF DYSRHYTHMIC FOCUS  02/26/2017  . ATRIAL FIBRILLATION ABLATION N/A 02/26/2017   Procedure: Atrial Fibrillation Ablation;  Surgeon: Thompson Grayer, MD;  Location: [place] [facility];  Service: Cardiovascular;  Laterality: N/A;  . bilateral inguinal hernia repair  07/10/2012   lap BIH repairs  . CARDIOVERSION N/A 03/07/2017   Procedure: CARDIOVERSION;  Surgeon: Fay Records, MD;  Location: [facility] ENDOSCOPY;  Service: Cardiovascular;  Laterality: N/A;  . COLONOSCOPY    . HERNIA REPAIR  07/10/12   LIH/rih-umb  . KNEE SURGERY  2005   Duda- arthroscopy, partial medial menisectomy-lt  . PROSTATE SURGERY  12/19/06   robotic prostatectomy  . rotator cuff surgery  2002   Gioffre-rt  . SHOULDER ARTHROSCOPY WITH ROTATOR CUFF REPAIR AND SUBACROMIAL DECOMPRESSION Left 07/10/2013   Procedure: LEFT SHOULDER ARTHROSCOPY WITH ARTHROSCOPIC ROTATOR CUFF REPAIR AND SUBACROMIAL DECOMPRESSION, PARTIAL ACROMIOPLASTY WITH CORACROMIAL RELEASE;  Surgeon: Johnny Bridge, MD;  Location: [place];  Service:  Orthopedics;  Laterality: Left;    ROS- all systems are reviewed and negatives except as per HPI above  Current Outpatient Medications  Medication Sig Dispense Refill  . rivaroxaban (XARELTO) 20 MG TABS tablet Take 1 tablet (20 mg total) by mouth daily with supper. 30 tablet 10   No current facility-administered medications for this visit.     Physical Exam: Vitals:   12/04/17 0930  BP: 126/72  Pulse: 65  Weight: 181 lb (82.1 kg)  Height: 5' 9.5" (1.765 m)    GEN- The patient is well appearing, alert and oriented x 3 today.   Head- normocephalic, atraumatic Eyes-  Sclera clear, conjunctiva pink Ears- hearing intact Oropharynx- clear Lungs- Clear to ausculation bilaterally, normal work of breathing Heart- Regular rate and rhythm, no murmurs, rubs or gallops, PMI not laterally displaced GI- soft, NT, ND, + BS Extremities- no clubbing, cyanosis, or edema  EKG tracing ordered today is personally reviewed and shows sinus rhythm, LAD, LVH  Assessment and Plan:  1. Paroxysmal atrial fibrillation Well controlled off AAD therapy EF has normalized (chads2vasc score is now 1) Stop anticoagualation Advised apple watch v 4  Return to see me in 6 months  Thompson Grayer MD, [facility] 12/04/2017 10:21 AM

## 2017-12-04 NOTE — Patient Instructions (Addendum)
Medication Instructions:  Your physician has recommended you make the following change in your medication:  1. STOP Xarelto  Labwork: None ordered  Testing/Procedures: None ordered  Follow-Up: Your physician wants you to follow-up in: 6 months with Dr. [name].  You will receive a reminder letter in the mail two months in advance. If you don't receive a letter, please call our office to schedule the follow-up appointment.  * If you need a refill on your cardiac medications before your next appointment, please call your pharmacy.   *Please note that any paperwork needing to be filled out by the provider will need to be addressed at the front desk prior to seeing the provider. Please note that any FMLA, disability or other documents regarding health condition is subject to a $25.00 charge that must be received prior to completion of paperwork in the form of a money order or check.  Thank you for choosing [facility]!!   Trinidad Curet, RN [phone]  Any Other Special Instructions Will Be Listed Below (If Applicable).  Consider Apple Watch, Series 4

## 2018-06-09 ENCOUNTER — Encounter: Payer: Self-pay | Admitting: Internal Medicine

## 2018-06-09 ENCOUNTER — Ambulatory Visit (INDEPENDENT_AMBULATORY_CARE_PROVIDER_SITE_OTHER): Payer: Medicare Other | Admitting: Internal Medicine

## 2018-06-09 VITALS — BP 110/76 | HR 62 | Ht 71.0 in | Wt 177.4 lb

## 2018-06-09 DIAGNOSIS — I480 Paroxysmal atrial fibrillation: Secondary | ICD-10-CM | POA: Diagnosis not present

## 2018-06-09 NOTE — Progress Notes (Signed)
   PCP: [name], MD Primary Cardiologist: Dr Johnsie Cancel Primary EP: Dr Jinny Sanders is a 72 y.o. male who presents today for routine electrophysiology followup.  Since last being seen in our clinic, the patient reports doing very well.  Today, he denies symptoms of palpitations, chest pain, shortness of breath,  lower extremity edema, dizziness, presyncope, or syncope.  The patient is otherwise without complaint today.   Past Medical History:  Diagnosis Date  . Basal cell carcinoma 2009   back  . Cancer of prostate ([place])    pT2c No Mx, Gleason 3+4=7  . Chronic systolic dysfunction of left ventricle    EF 45% by echo 2/18  . Diverticulitis   . Hypogonadism male   . Kidney stones   . Left rotator cuff tear 07/10/2013  . Nephrolithiasis   . Paroxysmal atrial fibrillation (HCC)   . Rheumatic fever 1952-1953   Past Surgical History:  Procedure Laterality Date  . ABLATION OF DYSRHYTHMIC FOCUS  02/26/2017  . ATRIAL FIBRILLATION ABLATION N/A 02/26/2017   Procedure: Atrial Fibrillation Ablation;  Surgeon: Thompson Grayer, MD;  Location: [place] [facility];  Service: Cardiovascular;  Laterality: N/A;  . bilateral inguinal hernia repair  07/10/2012   lap BIH repairs  . CARDIOVERSION N/A 03/07/2017   Procedure: CARDIOVERSION;  Surgeon: Fay Records, MD;  Location: [facility] ENDOSCOPY;  Service: Cardiovascular;  Laterality: N/A;  . COLONOSCOPY    . HERNIA REPAIR  07/10/12   LIH/rih-umb  . KNEE SURGERY  2005   Duda- arthroscopy, partial medial menisectomy-lt  . PROSTATE SURGERY  12/19/06   robotic prostatectomy  . rotator cuff surgery  2002   Gioffre-rt  . SHOULDER ARTHROSCOPY WITH ROTATOR CUFF REPAIR AND SUBACROMIAL DECOMPRESSION Left 07/10/2013   Procedure: LEFT SHOULDER ARTHROSCOPY WITH ARTHROSCOPIC ROTATOR CUFF REPAIR AND SUBACROMIAL DECOMPRESSION, PARTIAL ACROMIOPLASTY WITH CORACROMIAL RELEASE;  Surgeon: Johnny Bridge, MD;  Location: [place];  Service:  Orthopedics;  Laterality: Left;    ROS- all systems are reviewed and negatives except as per HPI above  No current outpatient medications on file.   No current facility-administered medications for this visit.     Physical Exam: Vitals:   06/09/18 0929  BP: 110/76  Pulse: 62  SpO2: 98%  Weight: 177 lb 6.4 oz (80.5 kg)  Height: 5\' 11"  (1.803 m)    GEN- The patient is well appearing, alert and oriented x 3 today.   Head- normocephalic, atraumatic Eyes-  Sclera clear, conjunctiva pink Ears- hearing intact Oropharynx- clear Lungs- Clear to ausculation bilaterally, normal work of breathing Heart- Regular rate and rhythm, no murmurs, rubs or gallops, PMI not laterally displaced GI- soft, NT, ND, + BS Extremities- no clubbing, cyanosis, or edema  Wt Readings from Last 3 Encounters:  06/09/18 177 lb 6.4 oz (80.5 kg)  12/04/17 181 lb (82.1 kg)  08/28/17 181 lb (82.1 kg)    EKG tracing ordered today is personally reviewed and shows sinus rhythm 62 bpm, PR 142 msec, QRS 82 msec, Qtc 412 msec, LAD  Assessment and Plan:  1. Paroxysmal atrial fibrillation Well controlled post ablation off AAD therapy chads2vasc score is 1.  He is therefore not on [facility] as per 2019 AHA/ACC/HRS guidelines  Follow-up in AF clinic in 6 months I will see in a year  Thompson Grayer MD, [facility] 06/09/2018 9:41 AM

## 2018-06-09 NOTE — Patient Instructions (Addendum)
Medication Instructions:  Your physician recommends that you continue on your current medications as directed. Please refer to the Current Medication list given to you today.  Labwork: None ordered.  Testing/Procedures: None ordered.  Follow-Up:  Your physician wants you to follow-up in: 6 months with the afib clinic.   You will receive a reminder letter in the mail two months in advance. If you don't receive a letter, please call our office to schedule the follow-up appointment.  Your physician wants you to follow-up in: one year with Dr. Allred.   You will receive a reminder letter in the mail two months in advance. If you don't receive a letter, please call our office to schedule the follow-up appointment.   Any Other Special Instructions Will Be Listed Below (If Applicable).  If you need a refill on your cardiac medications before your next appointment, please call your pharmacy.   

## 2018-06-16 DIAGNOSIS — L245 Irritant contact dermatitis due to other chemical products: Secondary | ICD-10-CM | POA: Diagnosis not present

## 2018-06-16 DIAGNOSIS — D1801 Hemangioma of skin and subcutaneous tissue: Secondary | ICD-10-CM | POA: Diagnosis not present

## 2018-06-16 DIAGNOSIS — Z85828 Personal history of other malignant neoplasm of skin: Secondary | ICD-10-CM | POA: Diagnosis not present

## 2018-06-16 DIAGNOSIS — L720 Epidermal cyst: Secondary | ICD-10-CM | POA: Diagnosis not present

## 2018-06-17 DIAGNOSIS — R1013 Epigastric pain: Secondary | ICD-10-CM | POA: Diagnosis not present

## 2018-06-17 DIAGNOSIS — Z86010 Personal history of colonic polyps: Secondary | ICD-10-CM | POA: Diagnosis not present

## 2018-06-25 ENCOUNTER — Telehealth: Payer: Self-pay | Admitting: *Deleted

## 2018-06-25 NOTE — Telephone Encounter (Signed)
Copied from [place] [id number]. Topic: Inquiry >> Jun 25, 2018  2:28 PM Reyne Dumas L wrote: Reason for CRM:   Pt wants to know when he is due for his next shingles vaccination and should he have that shot before or after the flu shot.  Pt can be reached at [phone]

## 2018-06-25 NOTE — Telephone Encounter (Signed)
Spoke with Derek [name] and advised him that he is due for the new Shingrix vaccine but he will have to get that at the pharmacy.  I advised him to call and get on their waiting list.  He states he just called and got put on the list.  I advised he could get his flu vaccine at any point.  He is schedule for colonoscopy next week so he wants to wait until that is over.  I also advised that if the pharmacy calls him for his Shingrix vaccine before he can get in here for his flu shot, that the pharmacy can also do his flu shot.  I just ask that he have the pharmacy forward the information to Dr. Lorelei Pont so we can update his vaccine.  Patient states understanding.

## 2018-07-01 DIAGNOSIS — D124 Benign neoplasm of descending colon: Secondary | ICD-10-CM | POA: Diagnosis not present

## 2018-07-01 DIAGNOSIS — Z86010 Personal history of colonic polyps: Secondary | ICD-10-CM | POA: Diagnosis not present

## 2018-07-01 DIAGNOSIS — K5730 Diverticulosis of large intestine without perforation or abscess without bleeding: Secondary | ICD-10-CM | POA: Diagnosis not present

## 2018-07-01 LAB — HM COLONOSCOPY

## 2018-07-04 DIAGNOSIS — D124 Benign neoplasm of descending colon: Secondary | ICD-10-CM | POA: Diagnosis not present

## 2018-07-21 ENCOUNTER — Ambulatory Visit: Payer: Medicare Other

## 2018-07-28 ENCOUNTER — Ambulatory Visit: Payer: Medicare Other | Admitting: Family Medicine

## 2018-08-08 ENCOUNTER — Telehealth: Payer: Self-pay | Admitting: Family Medicine

## 2018-08-08 DIAGNOSIS — Z8546 Personal history of malignant neoplasm of prostate: Secondary | ICD-10-CM

## 2018-08-08 NOTE — Telephone Encounter (Signed)
Ok to do. Thanks.  

## 2018-08-08 NOTE — Telephone Encounter (Signed)
error 

## 2018-08-08 NOTE — Telephone Encounter (Signed)
Left message asking pt to call office please r/s 12/2 appointment with dr copland to Dr Darnell Level.  Please add in appointment note ok per dr g

## 2018-08-08 NOTE — Telephone Encounter (Signed)
I called to r/s 12/2 appointment with dr copland.  Pt didn't want to wait till jan to get medicare cpx with dr copland.  Offered to schedule with dr Diona Browner he stated he saw you once 2018 and wanted to know if he could schedule medicare cpx with you.  Pt has appointment with lisa 11/20 for medicare wellness.

## 2018-08-08 NOTE — Telephone Encounter (Signed)
Pt called office and was able to schedule his physical with Dr.G on Dec 4 @ 12:30. Pt also would also like to do a PSA test done when he comes in for his fasting labs. Pt stated he was going to see Dr.Lester Alinda Money at [facility] in [place] and that he was released after 10 years. Dr.Copland would usually place order for him to get the PSA done.

## 2018-08-12 ENCOUNTER — Telehealth (INDEPENDENT_AMBULATORY_CARE_PROVIDER_SITE_OTHER): Payer: Medicare Other | Admitting: Family Medicine

## 2018-08-12 DIAGNOSIS — E785 Hyperlipidemia, unspecified: Secondary | ICD-10-CM | POA: Diagnosis not present

## 2018-08-12 DIAGNOSIS — I480 Paroxysmal atrial fibrillation: Secondary | ICD-10-CM

## 2018-08-12 NOTE — Telephone Encounter (Signed)
-----   Message from Eustace Pen, LPN sent at [phone]  1:07 PM EST ----- Regarding: Labs 11/20 Lab orders needed. Thank you.  Insurance:  [employer]

## 2018-08-13 ENCOUNTER — Ambulatory Visit (INDEPENDENT_AMBULATORY_CARE_PROVIDER_SITE_OTHER): Payer: Medicare Other

## 2018-08-13 DIAGNOSIS — Z8546 Personal history of malignant neoplasm of prostate: Secondary | ICD-10-CM

## 2018-08-13 LAB — CBC WITH DIFFERENTIAL/PLATELET
BASOS ABS: 0.1 10*3/uL (ref 0.0–0.1)
BASOS PCT: 1.1 % (ref 0.0–3.0)
EOS ABS: 0.2 10*3/uL (ref 0.0–0.7)
Eosinophils Relative: 4.3 % (ref 0.0–5.0)
HCT: 42.1 % (ref 39.0–52.0)
Hemoglobin: 14.4 g/dL (ref 13.0–17.0)
LYMPHS PCT: 33.1 % (ref 12.0–46.0)
Lymphs Abs: 1.7 10*3/uL (ref 0.7–4.0)
MCHC: 34.2 g/dL (ref 30.0–36.0)
MCV: 92.6 fl (ref 78.0–100.0)
MONO ABS: 0.4 10*3/uL (ref 0.1–1.0)
Monocytes Relative: 8 % (ref 3.0–12.0)
NEUTROS ABS: 2.7 10*3/uL (ref 1.4–7.7)
Neutrophils Relative %: 53.5 % (ref 43.0–77.0)
PLATELETS: 221 10*3/uL (ref 150.0–400.0)
RBC: 4.54 Mil/uL (ref 4.22–5.81)
RDW: 13.4 % (ref 11.5–15.5)
WBC: 5 10*3/uL (ref 4.0–10.5)

## 2018-08-13 NOTE — Progress Notes (Signed)
AWV not completed. Patient left facility after labs. R/S to 08/27/18 same day with CPE. Patient verbally agreed.

## 2018-08-18 NOTE — Addendum Note (Signed)
Addended by: [name] on: 08/18/2018 07:44 AM   Modules accepted: Orders

## 2018-08-18 NOTE — Telephone Encounter (Signed)
PSA also ordered

## 2018-08-25 ENCOUNTER — Ambulatory Visit: Payer: Medicare Other | Admitting: Family Medicine

## 2018-08-26 ENCOUNTER — Encounter: Payer: Self-pay | Admitting: Family Medicine

## 2018-08-26 NOTE — Progress Notes (Signed)
BP 110/72 (BP Location: Right Arm, Patient Position: Sitting, Cuff Size: Normal)   Pulse 61   Temp 98.5 F (36.9 C) (Oral)   Ht 5' 9.5" (1.765 m) Comment: shoes  Wt 180 lb 8 oz (81.9 kg)   SpO2 95%   BMI 26.27 kg/m     CC: CPE Subjective:    Patient ID: Derek [name], male    DOB: Nov 25, 1945, 72 y.o.   MRN: [id number]  HPI: Derek [name] is a 72 y.o. male presenting on 08/27/2018 for Annual Exam (part 2)   Seeing me for medicare wellness follow up visit as PCP was unavailable.  Saw Lesia today as well. Note will be reviewed.   PAF s/p cardioversion then ablation 02/2017, regularly sees EP without recurrence.   Preventative: Colon cancer screening - per pt 2019 at [facility], 1 polyp, rec rpt 5 yrs (Buccini) - will request report today Prostate cancer s/p prostatectomy 2008 - yearly PSA  Lung cancer screening - not eligible Flu shot - yearly Tdap 2016 Pneumovax 2014, prevnar 2016 Zostavax - 2007 Shingrix - 06/2018, rpt pending Advanced directive discussion - has living will at home. Wife is HCPOA. Asked to bring [place] copy.  Seat belt use discussed Sunscreen use discussed. No changing moles on skin. Non smoker Alcohol - none Dentist - q6 mo Eye exam - q2 yrs  Regular exercise: yes - runner 5d/wk, former marathon runner Diet: no red meat, good water, fruits/vegetables daily   Relevant past medical, surgical, family and social history reviewed and updated as indicated. Interim medical history since our last visit reviewed. Allergies and medications reviewed and updated. No outpatient medications prior to visit.   No facility-administered medications prior to visit.      Per HPI unless specifically indicated in ROS section below Review of Systems  Constitutional: Negative for activity change, appetite change, chills, fatigue, fever and unexpected weight change.  HENT: Negative for hearing loss.   Eyes: Negative for visual disturbance.  Respiratory: Negative for cough,  chest tightness, shortness of breath and wheezing.   Cardiovascular: Negative for chest pain, palpitations and leg swelling.  Gastrointestinal: Negative for abdominal distention, abdominal pain, blood in stool, constipation, diarrhea, nausea and vomiting.  Genitourinary: Negative for difficulty urinating and hematuria.  Musculoskeletal: Negative for arthralgias, myalgias and neck pain.  Skin: Negative for rash.  Neurological: Negative for dizziness, seizures, syncope and headaches.  Hematological: Negative for adenopathy. Does not bruise/bleed easily.  Psychiatric/Behavioral: Negative for dysphoric mood. The patient is not nervous/anxious.        Objective:    BP 110/72 (BP Location: Right Arm, Patient Position: Sitting, Cuff Size: Normal)   Pulse 61   Temp 98.5 F (36.9 C) (Oral)   Ht 5' 9.5" (1.765 m) Comment: shoes  Wt 180 lb 8 oz (81.9 kg)   SpO2 95%   BMI 26.27 kg/m   Wt Readings from Last 3 Encounters:  08/27/18 180 lb 8 oz (81.9 kg)  08/27/18 180 lb 8 oz (81.9 kg)  06/09/18 177 lb 6.4 oz (80.5 kg)    Physical Exam  Constitutional: He is oriented to person, place, and time. He appears well-developed and well-nourished. No distress.  HENT:  Head: Normocephalic and atraumatic.  Right Ear: Hearing, tympanic membrane, external ear and ear canal normal.  Left Ear: Hearing, tympanic membrane, external ear and ear canal normal.  Nose: Nose normal.  Mouth/Throat: Uvula is midline, oropharynx is clear and moist and mucous membranes are normal. No oropharyngeal exudate, posterior  oropharyngeal edema or posterior oropharyngeal erythema.  Eyes: Pupils are equal, round, and reactive to light. Conjunctivae and EOM are normal. No scleral icterus.  Neck: Normal range of motion. Neck supple. Carotid bruit is not present. No thyromegaly present.  Cardiovascular: Normal rate, regular rhythm, normal heart sounds and intact distal pulses.  No murmur heard. Pulses:      Radial pulses are 2+  on the right side, and 2+ on the left side.  Pulmonary/Chest: Effort normal and breath sounds normal. No respiratory distress. He has no wheezes. He has no rales.  Abdominal: Soft. Bowel sounds are normal. He exhibits no distension and no mass. There is no tenderness. There is no rebound and no guarding.  Musculoskeletal: Normal range of motion. He exhibits no edema.  Lymphadenopathy:    He has no cervical adenopathy.  Neurological: He is alert and oriented to person, place, and time.  CN grossly intact, station and gait intact  Skin: Skin is warm and dry. No rash noted.  Psychiatric: He has a normal mood and affect. His behavior is normal. Judgment and thought content normal.  Nursing note and vitals reviewed.  Results for orders placed or performed in visit on 08/27/18  HM COLONOSCOPY  Result Value Ref Range   HM Colonoscopy See Report (in chart) See Report (in chart), Patient Reported      Assessment & Plan:   Problem List Items Addressed This Visit    PROSTATE CANCER, HX OF (Chronic)    Continue yearly PSA - checked today. Should stay 0      History of atrial fibrillation    S/p ablation, now off anticoagulation. Sees EP yearly.       Health maintenance examination - Primary    Preventative protocols reviewed and updated unless pt declined. Discussed healthy diet and lifestyle.  Overall very healthy - not taking medications or supplements. Continues running regularly.      Advanced directives, counseling/discussion    Advanced directive discussion - has living will at home. Wife is HCPOA. Asked to bring [place] copy.           No orders of the defined types were placed in this encounter.  No orders of the defined types were placed in this encounter.   Follow up plan: Return in about 1 year (around 08/28/2019) for annual exam, prior fasting for blood work, medicare wellness visit.  [name], MD

## 2018-08-27 ENCOUNTER — Encounter: Payer: Self-pay | Admitting: Family Medicine

## 2018-08-27 ENCOUNTER — Ambulatory Visit (INDEPENDENT_AMBULATORY_CARE_PROVIDER_SITE_OTHER): Payer: Medicare Other

## 2018-08-27 ENCOUNTER — Ambulatory Visit (INDEPENDENT_AMBULATORY_CARE_PROVIDER_SITE_OTHER): Payer: Medicare Other | Admitting: Family Medicine

## 2018-08-27 VITALS — BP 110/72 | HR 61 | Temp 98.5°F | Ht 69.5 in | Wt 180.5 lb

## 2018-08-27 DIAGNOSIS — Z23 Encounter for immunization: Secondary | ICD-10-CM | POA: Diagnosis not present

## 2018-08-27 DIAGNOSIS — Z8546 Personal history of malignant neoplasm of prostate: Secondary | ICD-10-CM

## 2018-08-27 DIAGNOSIS — Z0000 Encounter for general adult medical examination without abnormal findings: Secondary | ICD-10-CM

## 2018-08-27 DIAGNOSIS — Z8679 Personal history of other diseases of the circulatory system: Secondary | ICD-10-CM | POA: Diagnosis not present

## 2018-08-27 DIAGNOSIS — E785 Hyperlipidemia, unspecified: Secondary | ICD-10-CM

## 2018-08-27 DIAGNOSIS — Z7189 Other specified counseling: Secondary | ICD-10-CM | POA: Insufficient documentation

## 2018-08-27 LAB — LIPID PANEL
CHOLESTEROL: 203 mg/dL — AB (ref 0–200)
HDL: 60.6 mg/dL (ref >39.00)
LDL CALC: 115 mg/dL — AB (ref 0–99)
NONHDL: 142.82
Total CHOL/HDL Ratio: 3
Triglycerides: 140 mg/dL (ref 0.0–149.0)
VLDL: 28 mg/dL (ref 0.0–40.0)

## 2018-08-27 LAB — COMPREHENSIVE METABOLIC PANEL
ALBUMIN: 4.5 g/dL (ref 3.5–5.2)
ALK PHOS: 83 U/L (ref 39–117)
ALT: 19 U/L (ref 0–53)
AST: 27 U/L (ref 0–37)
BUN: 20 mg/dL (ref 6–23)
CO2: 30 mEq/L (ref 19–32)
CREATININE: 1.11 mg/dL (ref 0.40–1.50)
Calcium: 10.1 mg/dL (ref 8.4–10.5)
Chloride: 102 mEq/L (ref 96–112)
GFR: 68.72 mL/min (ref >60.00)
GLUCOSE: 91 mg/dL (ref 70–99)
Potassium: 4.7 mEq/L (ref 3.5–5.1)
SODIUM: 139 meq/L (ref 135–145)
TOTAL PROTEIN: 6.9 g/dL (ref 6.0–8.3)
Total Bilirubin: 0.5 mg/dL (ref 0.2–1.2)

## 2018-08-27 LAB — PSA: PSA: 0.07 ng/mL — ABNORMAL LOW (ref 0.10–4.00)

## 2018-08-27 LAB — TSH: TSH: 2 u[IU]/mL (ref 0.35–4.50)

## 2018-08-27 NOTE — Progress Notes (Signed)
PCP notes:   Health maintenance:  Flu vaccine - administered  Abnormal screenings:   None  Patient concerns:   None  Nurse concerns:  None  Next PCP appt:   08/27/18 @ 1230

## 2018-08-27 NOTE — Assessment & Plan Note (Signed)
Continue yearly PSA - checked today. Should stay 0

## 2018-08-27 NOTE — Progress Notes (Signed)
Subjective:   Derek [name] is a 72 y.o. male who presents for Medicare Annual/Subsequent preventive examination.  Review of Systems:  N/A Cardiac Risk Factors include: advanced age (>63men, >16 women);male gender     Objective:    Vitals: BP 110/72 (BP Location: Right Arm, Patient Position: Sitting, Cuff Size: Normal)   Pulse 61   Temp 98.5 F (36.9 C) (Oral)   Ht 5' 9.5" (1.765 m) Comment: shoes  Wt 180 lb 8 oz (81.9 kg)   SpO2 95%   BMI 26.27 kg/m   Body mass index is 26.27 kg/m.  Advanced Directives 08/27/2018 07/05/2017 03/07/2017 02/26/2017 07/07/2013 06/25/2013  Does Patient Have a Medical Advance Directive? Yes Yes Yes Yes Patient has advance directive, copy not in chart Patient has advance directive, copy not in chart  Type of Advance Directive [place];Living will [place];Living will Living will;Healthcare Power of [place];Living will Living will;Healthcare Power of Attorney  Does patient want to make changes to medical advance directive? - - - No - Patient declined - -  Copy of [place] in Chart? Yes - validated most recent copy scanned in chart (See row information) No - copy requested No - copy requested No - copy requested - -    Tobacco Social History   Tobacco Use  Smoking Status Former Smoker  . Last attempt to quit: 09/25/1971  . Years since quitting: 46.9  Smokeless Tobacco Never Used     Counseling given: No   Clinical Intake:  Pre-visit preparation completed: Yes  Pain : No/denies pain Pain Score: 0-No pain     Nutritional Status: BMI 25 -29 Overweight Nutritional Risks: None Diabetes: No  How often do you need to have someone help you when you read instructions, pamphlets, or other written materials from your doctor or pharmacy?: 1 - Never What is the last grade level you completed in school?: Associate degree  Interpreter  Needed?: No  Comments: PT LIVES WITH SPOUSE Information entered by :: LPinson, LPN  Past Medical History:  Diagnosis Date  . Basal cell carcinoma 2009   back  . Cancer of prostate ([place])    pT2c No Mx, Gleason 3+4=7  . Chronic systolic dysfunction of left ventricle    EF 45% by echo 2/18  . Diverticulitis   . Hypogonadism male   . Kidney stones   . Left rotator cuff tear 07/10/2013  . Nephrolithiasis   . Paroxysmal atrial fibrillation (H[facility])   . Rheumatic fever 1952-1953   Past Surgical History:  Procedure Laterality Date  . ABLATION OF DYSRHYTHMIC FOCUS  02/26/2017  . ATRIAL FIBRILLATION ABLATION N/A 02/26/2017   Procedure: Atrial Fibrillation Ablation;  Surgeon: Thompson Grayer, MD;  Location: [facility];  Service: Cardiovascular;  Laterality: N/A;  . bilateral inguinal hernia repair  07/10/2012   lap BIH repairs  . CARDIOVERSION N/A 03/07/2017   Procedure: CARDIOVERSION;  Surgeon: Fay Records, MD;  Location: [facility];  Service: Cardiovascular;  Laterality: N/A;  . COLONOSCOPY    . HERNIA REPAIR  07/10/12   LIH/rih-umb  . KNEE SURGERY  2005   Duda- arthroscopy, partial medial menisectomy-lt  . PROSTATE SURGERY  12/19/06   robotic prostatectomy  . rotator cuff surgery  2002   Gioffre-rt  . SHOULDER ARTHROSCOPY WITH ROTATOR CUFF REPAIR AND SUBACROMIAL DECOMPRESSION Left 07/10/2013   Procedure: LEFT SHOULDER ARTHROSCOPY WITH ARTHROSCOPIC ROTATOR CUFF REPAIR AND SUBACROMIAL DECOMPRESSION,  PARTIAL ACROMIOPLASTY WITH CORACROMIAL RELEASE;  Surgeon: Johnny Bridge, MD;  Location: [facility];  Service: Orthopedics;  Laterality: Left;   Family History  Problem Relation Age of Onset  . Breast cancer Mother   . Cancer Brother 90       brain tumor  . Heart disease Unknown        fam hx  . Arthritis Other        other relative  . Prostate cancer Other        nephew  . Lung cancer Other 81       nephew  . Arrhythmia Father    Social History    Socioeconomic History  . Marital status: Married    Spouse name: Not on file  . Number of children: Not on file  . Years of education: Not on file  . Highest education level: Not on file  Occupational History  . Occupation: retired Licensed conveyancer)    Employer: [employer]  . Financial resource strain: Not on file  . Food insecurity:    Worry: Not on file    Inability: Not on file  . Transportation needs:    Medical: Not on file    Non-medical: Not on file  Tobacco Use  . Smoking status: Former Smoker    Last attempt to quit: 09/25/1971    Years since quitting: 46.9  . Smokeless tobacco: Never Used  Substance and Sexual Activity  . Alcohol use: No  . Drug use: No  . Sexual activity: Not on file  Lifestyle  . Physical activity:    Days per week: Not on file    Minutes per session: Not on file  . Stress: Not on file  Relationships  . Social connections:    Talks on phone: Not on file    Gets together: Not on file    Attends religious service: Not on file    Active member of club or organization: Not on file    Attends meetings of clubs or organizations: Not on file    Relationship status: Not on file  Other Topics Concern  . Not on file  Social History Narrative   Regular exercise: yes - runner 5d/wk, former marathon runner   Diet: no red meat, good water, fruits/vegetables daily     No outpatient encounter medications on file as of 08/27/2018.   No facility-administered encounter medications on file as of 08/27/2018.     Activities of Daily Living In your present state of health, do you have any difficulty performing the following activities: 08/27/2018  Hearing? N  Vision? N  Difficulty concentrating or making decisions? N  Walking or climbing stairs? N  Dressing or bathing? N  Doing errands, shopping? N  Preparing Food and eating ? N  Using the Toilet? N  In the past six months, have you accidently leaked urine? N  Do you have  problems with loss of bowel control? N  Managing your Medications? N  Managing your Finances? N  Housekeeping or managing your Housekeeping? N  Some recent data might be hidden    Patient Care Team: [name], MD as PCP - General Raynelle Bring, MD as Consulting Physician (Urology) Danella Sensing, MD as Consulting Physician (Dermatology) Rutherford Guys, MD as Consulting Physician (Ophthalmology) Thompson Grayer, MD as Consulting Physician (Cardiology) Javier Docker. Dalbert Mayotte., DMD as Referring Physician (Dentistry)   Assessment:   This is a routine wellness examination for Charls.  Hearing Screening   125Hz  250Hz  500Hz  1000Hz  2000Hz  3000Hz  4000Hz  6000Hz  8000Hz   Right ear:   40 40 40  40    Left ear:   40 40 40  40      Visual Acuity Screening   Right eye Left eye Both eyes  Without correction: 20/25-1 20/20 20/20-1  With correction:       Exercise Activities and Dietary recommendations Current Exercise Habits: Home exercise routine, Type of exercise: strength training/weights;treadmill, Time (Minutes): > 60, Frequency (Times/Week): 5, Weekly Exercise (Minutes/Week): 0, Intensity: Moderate, Exercise limited by: None identified  Goals    . Increase physical activity     Starting 08/27/2018, I will continue to exercise for at least 70 minutes 4-5 days per week.       Fall Risk Fall Risk  08/27/2018 07/05/2017 07/02/2016 06/20/2015 06/16/2014  Falls in the past year? 0 No No No No   Depression Screen PHQ 2/9 Scores 08/27/2018 07/05/2017 07/02/2016 06/20/2015  PHQ - 2 Score 0 0 0 0  PHQ- 9 Score 0 0 - -    Cognitive Function MMSE - Mini Mental State Exam 08/27/2018 07/05/2017  Orientation to time 5 5  Orientation to Place 5 5  Registration 3 3  Attention/ Calculation 0 0  Recall 3 3  Language- name 2 objects 0 0  Language- repeat 1 1  Language- follow 3 step command 3 3  Language- read & follow direction 0 0  Write a sentence 0 0  Copy design 0 0  Total score 20 20      PLEASE NOTE: A Mini-Cog screen was completed. Maximum score is 20. A value of 0 denotes this part of [place] MMSE was not completed or the patient failed this part of the Mini-Cog screening.   Mini-Cog Screening Orientation to Time - Max 5 pts Orientation to Place - Max 5 pts Registration - Max 3 pts Recall - Max 3 pts Language Repeat - Max 1 pts Language Follow 3 Step Command - Max 3 pts     Immunization History  Administered Date(s) Administered  . Influenza,inj,Quad PF,6+ Mos 06/25/2013, 06/16/2014, 07/21/2015, 07/02/2016, 07/22/2017, 08/27/2018  . Pneumococcal Conjugate-13 05/03/2015  . Pneumococcal Polysaccharide-23 08/14/2006, 06/25/2013  . Tdap 12/22/2014  . Zoster 08/14/2006  . Zoster Recombinat (Shingrix) 07/08/2018    Screening Tests Health Maintenance  Topic Date Due  . COLONOSCOPY  07/02/2023  . TETANUS/TDAP  12/21/2024  . INFLUENZA VACCINE  Completed  . Hepatitis C Screening  Completed  . PNA vac Low Risk Adult  Completed   Plan:     I have personally reviewed, addressed, and noted the following in the patient's chart:  A. Medical and social history B. Use of alcohol, tobacco or illicit drugs  C. Current medications and supplements D. Functional ability and status E.  Nutritional status F.  Physical activity G. Advance directives H. List of other physicians I.  Hospitalizations, surgeries, and ER visits in previous 12 months J.  [place] to include hearing, vision, cognitive, depression L. Referrals and appointments - none  In addition, I have reviewed and discussed with patient certain preventive protocols, quality metrics, and best practice recommendations. A written personalized care plan for preventive services as well as general preventive health recommendations were provided to patient.  See attached scanned questionnaire for additional information.   Signed,   Lindell Noe, MHA, BS, LPN Health Coach

## 2018-08-27 NOTE — Assessment & Plan Note (Addendum)
Preventative protocols reviewed and updated unless pt declined. Discussed healthy diet and lifestyle.  Overall very healthy - not taking medications or supplements. Continues running regularly.

## 2018-08-27 NOTE — Assessment & Plan Note (Signed)
Advanced directive discussion - has living will at home. Wife is HCPOA. Asked to bring [place] copy.

## 2018-08-27 NOTE — Patient Instructions (Addendum)
Labs today Sign release for records of colonoscopy 06/2018  Bring [place] copy of your advanced directive at your convenience to update your chart. You are doing well today. Return as needed or in 1 year for next wellness visit and physical.  Health Maintenance, Male A healthy lifestyle and preventive care is important for your health and wellness. Ask your health care provider about what schedule of regular examinations is right for you. What should I know about weight and diet? Eat a Healthy Diet  Eat plenty of vegetables, fruits, whole grains, low-fat dairy products, and lean protein.  Do not eat a lot of foods high in solid fats, added sugars, or salt.  Maintain a Healthy Weight Regular exercise can help you achieve or maintain a healthy weight. You should:  Do at least 150 minutes of exercise each week. The exercise should increase your heart rate and make you sweat (moderate-intensity exercise).  Do strength-training exercises at least twice a week.  Watch Your Levels of Cholesterol and Blood Lipids  Have your blood tested for lipids and cholesterol every 5 years starting at 74 years of age. If you are at high risk for heart disease, you should start having your blood tested when you are 74 years old. You may need to have your cholesterol levels checked more often if: ? Your lipid or cholesterol levels are high. ? You are older than 74 years of age. ? You are at high risk for heart disease.  What should I know about cancer screening? Many types of cancers can be detected early and may often be prevented. Lung Cancer  You should be screened every year for lung cancer if: ? You are a current smoker who has smoked for at least 30 years. ? You are a former smoker who has quit within the past 15 years.  Talk to your health care provider about your screening options, when you should start screening, and how often you should be screened.  Colorectal Cancer  Routine colorectal cancer  screening usually begins at 74 years of age and should be repeated every 5-10 years until you are 74 years old. You may need to be screened more often if early forms of precancerous polyps or small growths are found. Your health care provider may recommend screening at an earlier age if you have risk factors for colon cancer.  Your health care provider may recommend using home test kits to check for hidden blood in the stool.  A small camera at the end of a tube can be used to examine your colon (sigmoidoscopy or colonoscopy). This checks for the earliest forms of colorectal cancer.  Prostate and Testicular Cancer  Depending on your age and overall health, your health care provider may do certain tests to screen for prostate and testicular cancer.  Talk to your health care provider about any symptoms or concerns you have about testicular or prostate cancer.  Skin Cancer  Check your skin from head to toe regularly.  Tell your health care provider about any new moles or changes in moles, especially if: ? There is a change in a mole's size, shape, or color. ? You have a mole that is larger than a pencil eraser.  Always use sunscreen. Apply sunscreen liberally and repeat throughout the day.  Protect yourself by wearing long sleeves, pants, a wide-brimmed hat, and sunglasses when outside.  What should I know about heart disease, diabetes, and high blood pressure?  If you are 2-89 years of age,  have your blood pressure checked every 3-5 years. If you are 45 years of age or older, have your blood pressure checked every year. You should have your blood pressure measured twice-once when you are at a hospital or clinic, and once when you are not at a hospital or clinic. Record the average of the two measurements. To check your blood pressure when you are not at a hospital or clinic, you can use: ? An automated blood pressure machine at a pharmacy. ? A home blood pressure monitor.  Talk to your  health care provider about your target blood pressure.  If you are between 74-52 years old, ask your health care provider if you should take aspirin to prevent heart disease.  Have regular diabetes screenings by checking your fasting blood sugar level. ? If you are at a normal weight and have a low risk for diabetes, have this test once every three years after the age of 79. ? If you are overweight and have a high risk for diabetes, consider being tested at a younger age or more often.  A one-time screening for abdominal aortic aneurysm (AAA) by ultrasound is recommended for men aged 84-75 years who are current or former smokers. What should I know about preventing infection? Hepatitis B If you have a higher risk for hepatitis B, you should be screened for this virus. Talk with your health care provider to find out if you are at risk for hepatitis B infection. Hepatitis C Blood testing is recommended for:  Everyone born from [birth year] through 1967.  Anyone with known risk factors for hepatitis C.  Sexually Transmitted Diseases (STDs)  You should be screened each year for STDs including gonorrhea and chlamydia if: ? You are sexually active and are younger than 74 years of age. ? You are older than 73 years of age and your health care provider tells you that you are at risk for this type of infection. ? Your sexual activity has changed since you were last screened and you are at an increased risk for chlamydia or gonorrhea. Ask your health care provider if you are at risk.  Talk with your health care provider about whether you are at high risk of being infected with HIV. Your health care provider may recommend a prescription medicine to help prevent HIV infection.  What else can I do?  Schedule regular health, dental, and eye exams.  Stay current with your vaccines (immunizations).  Do not use any tobacco products, such as cigarettes, chewing tobacco, and e-cigarettes. If you need help  quitting, ask your health care provider.  Limit alcohol intake to no more than 2 drinks per day. One drink equals 12 ounces of beer, 5 ounces of wine, or 1 ounces of hard liquor.  Do not use street drugs.  Do not share needles.  Ask your health care provider for help if you need support or information about quitting drugs.  Tell your health care provider if you often feel depressed.  Tell your health care provider if you have ever been abused or do not feel safe at home. This information is not intended to replace advice given to you by your health care provider. Make sure you discuss any questions you have with your health care provider. Document Released: 03/08/2008 Document Revised: 05/09/2016 Document Reviewed: 06/14/2015 Elsevier Interactive Patient Education  [employer].

## 2018-08-27 NOTE — Patient Instructions (Signed)
Derek [name] , Thank you for taking time to come for your Medicare Wellness Visit. I appreciate your ongoing commitment to your health goals. Please review the following plan we discussed and let me know if I can assist you in the future.   These are the goals we discussed: Goals    . Increase physical activity     Starting 08/27/2018, I will continue to exercise for at least 70 minutes 4-5 days per week.       This is a list of the screening recommended for you and due dates:  Health Maintenance  Topic Date Due  . Colon Cancer Screening  07/02/2023  . Tetanus Vaccine  12/21/2024  . Flu Shot  Completed  .  Hepatitis C: One time screening is recommended by Center for Disease Control  (CDC) for  adults born from [birth year] through 1967.   Completed  . Pneumonia vaccines  Completed   Preventive Care for Adults  A healthy lifestyle and preventive care can promote health and wellness. Preventive health guidelines for adults include the following key practices.  . A routine yearly physical is a good way to check with your health care provider about your health and preventive screening. It is a chance to share any concerns and updates on your health and to receive a thorough exam.  . Visit your dentist for a routine exam and preventive care every 6 months. Brush your teeth twice a day and floss once a day. Good oral hygiene prevents tooth decay and gum disease.  . The frequency of eye exams is based on your age, health, family medical history, use  of contact lenses, and other factors. Follow your health care provider's recommendations for frequency of eye exams.  . Eat a healthy diet. Foods like vegetables, fruits, whole grains, low-fat dairy products, and lean protein foods contain the nutrients you need without too many calories. Decrease your intake of foods high in solid fats, added sugars, and salt. Eat the right amount of calories for you. Get information about a proper diet from your health  care provider, if necessary.  . Regular physical exercise is one of the most important things you can do for your health. Most adults should get at least 150 minutes of moderate-intensity exercise (any activity that increases your heart rate and causes you to sweat) each week. In addition, most adults need muscle-strengthening exercises on 2 or more days a week.  Silver Sneakers may be a benefit available to you. To determine eligibility, you may visit the website: www.silversneakers.com or contact program at 403 419 1196 Mon-Fri between 8AM-8PM.   . Maintain a healthy weight. The body mass index (BMI) is a screening tool to identify possible weight problems. It provides an estimate of body fat based on height and weight. Your health care provider can find your BMI and can help you achieve or maintain a healthy weight.   For adults 20 years and older: ? A BMI below 18.5 is considered underweight. ? A BMI of 18.5 to 24.9 is normal. ? A BMI of 25 to 29.9 is considered overweight. ? A BMI of 30 and above is considered obese.   . Maintain normal blood lipids and cholesterol levels by exercising and minimizing your intake of saturated fat. Eat a balanced diet with plenty of fruit and vegetables. Blood tests for lipids and cholesterol should begin at age 29 and be repeated every 5 years. If your lipid or cholesterol levels are high, you are over 50, or  you are at high risk for heart disease, you may need your cholesterol levels checked more frequently. Ongoing high lipid and cholesterol levels should be treated with medicines if diet and exercise are not working.  . If you smoke, find out from your health care provider how to quit. If you do not use tobacco, please do not start.  . If you choose to drink alcohol, please do not consume more than 2 drinks per day. One drink is considered to be 12 ounces (355 mL) of beer, 5 ounces (148 mL) of wine, or 1.5 ounces (44 mL) of liquor.  . If you are 38-106  years old, ask your health care provider if you should take aspirin to prevent strokes.  . Use sunscreen. Apply sunscreen liberally and repeatedly throughout the day. You should seek shade when your shadow is shorter than you. Protect yourself by wearing long sleeves, pants, a wide-brimmed hat, and sunglasses year round, whenever you are outdoors.  . Once a month, do a whole body skin exam, using a mirror to look at the skin on your back. Tell your health care provider of new moles, moles that have irregular borders, moles that are larger than a pencil eraser, or moles that have changed in shape or color.

## 2018-08-27 NOTE — Assessment & Plan Note (Signed)
S/p ablation, now off anticoagulation. Sees EP yearly.

## 2018-08-30 ENCOUNTER — Encounter: Payer: Self-pay | Admitting: Family Medicine

## 2018-08-30 ENCOUNTER — Other Ambulatory Visit: Payer: Self-pay | Admitting: Family Medicine

## 2018-08-30 DIAGNOSIS — Z8546 Personal history of malignant neoplasm of prostate: Secondary | ICD-10-CM

## 2018-10-08 DIAGNOSIS — H43813 Vitreous degeneration, bilateral: Secondary | ICD-10-CM | POA: Diagnosis not present

## 2018-10-18 NOTE — Progress Notes (Signed)
I reviewed health advisor's note, was available for consultation, and agree with documentation and plan.  

## 2018-12-02 ENCOUNTER — Ambulatory Visit (HOSPITAL_COMMUNITY)
Admission: RE | Admit: 2018-12-02 | Discharge: 2018-12-02 | Disposition: A | Discharge status: Home / Self Care | Payer: Medicare Other | Source: Ambulatory Visit | Attending: Nurse Practitioner | Admitting: Nurse Practitioner

## 2018-12-02 ENCOUNTER — Encounter (HOSPITAL_COMMUNITY): Payer: Self-pay | Admitting: Nurse Practitioner

## 2018-12-02 VITALS — BP 122/76 | HR 66 | Ht 69.5 in | Wt 178.0 lb

## 2018-12-02 DIAGNOSIS — Z801 Family history of malignant neoplasm of trachea, bronchus and lung: Secondary | ICD-10-CM | POA: Diagnosis not present

## 2018-12-02 DIAGNOSIS — Z85828 Personal history of other malignant neoplasm of skin: Secondary | ICD-10-CM | POA: Diagnosis not present

## 2018-12-02 DIAGNOSIS — I498 Other specified cardiac arrhythmias: Secondary | ICD-10-CM | POA: Diagnosis not present

## 2018-12-02 DIAGNOSIS — Z8249 Family history of ischemic heart disease and other diseases of the circulatory system: Secondary | ICD-10-CM | POA: Diagnosis not present

## 2018-12-02 DIAGNOSIS — Z803 Family history of malignant neoplasm of breast: Secondary | ICD-10-CM | POA: Diagnosis not present

## 2018-12-02 DIAGNOSIS — Z888 Allergy status to other drugs, medicaments and biological substances status: Secondary | ICD-10-CM | POA: Insufficient documentation

## 2018-12-02 DIAGNOSIS — Z8546 Personal history of malignant neoplasm of prostate: Secondary | ICD-10-CM | POA: Insufficient documentation

## 2018-12-02 DIAGNOSIS — I480 Paroxysmal atrial fibrillation: Secondary | ICD-10-CM | POA: Diagnosis not present

## 2018-12-02 DIAGNOSIS — Z87891 Personal history of nicotine dependence: Secondary | ICD-10-CM | POA: Insufficient documentation

## 2018-12-02 DIAGNOSIS — I5022 Chronic systolic (congestive) heart failure: Secondary | ICD-10-CM | POA: Diagnosis not present

## 2018-12-02 DIAGNOSIS — Z8042 Family history of malignant neoplasm of prostate: Secondary | ICD-10-CM | POA: Diagnosis not present

## 2018-12-02 DIAGNOSIS — I4892 Unspecified atrial flutter: Secondary | ICD-10-CM | POA: Insufficient documentation

## 2018-12-02 DIAGNOSIS — Z808 Family history of malignant neoplasm of other organs or systems: Secondary | ICD-10-CM | POA: Diagnosis not present

## 2018-12-02 NOTE — Progress Notes (Signed)
Primary Care Physician: [name], MD Referring Physician: Ziare Cryder is a 73 y.o. male with a h/o rheumatic fever(no structural abnormalities), and atrial fibrillation, new onset in February 2018 with v rates around 100 bpm in PCP office. Afib was in the setting of 3 root canals with infection. He was then sent to Dr. Johnsie Cancel and plans were for DCCV but he converted spontaneously. He had return of afib in March. Echo showed EF 45-50% in afib with mildly dilated root. He was placed on xarelto with a chadsvasc score of 1. He was scheduled for a ETT and event monitor. He was placed on flecainide 50 mg bid on 4/2. He felt progressively worse while taking drug, with fatigue and dizziness and took himself off drug 5/8. Monitor has revealed SR with afib. Strips from 5/13 at 5:46 am showed a slow afib in the 30's with pauses up to 3.2 seconds, this was while pt was sleeping. Another slow afib epiosde with pauses was reported on 4/21 at 5:51 am. He then had short term amiodarone until ablation 02/2017.  He is in the afib clinic for f/u. He reports no afib. He enjoys working out. He is on no meds. Feels well.  Today, he denies symptoms of palpitations, chest pain, shortness of breath, orthopnea, PND, lower extremity edema, dizziness, presyncope, syncope, or neurologic sequela. The patient is tolerating medications without difficulties and is otherwise without complaint today.   Past Medical History:  Diagnosis Date  . Basal cell carcinoma 2009   back  . Cancer of prostate ([facility])    pT2c No Mx, Gleason 3+4=7  . Chronic systolic dysfunction of left ventricle    EF 45% by echo 2/18  . Diverticulitis   . Hypogonadism male   . Kidney stones   . Left rotator cuff tear 07/10/2013  . Nephrolithiasis   . Paroxysmal atrial fibrillation ([facility])   . Rheumatic fever 1952-1953   Past Surgical History:  Procedure Laterality Date  . ABLATION OF DYSRHYTHMIC FOCUS  02/26/2017  . ATRIAL  FIBRILLATION ABLATION N/A 02/26/2017   Procedure: Atrial Fibrillation Ablation;  Surgeon: Thompson Grayer, MD;  Location: [facility];  Service: Cardiovascular;  Laterality: N/A;  . bilateral inguinal hernia repair  07/10/2012   lap BIH repairs  . CARDIOVERSION N/A 03/07/2017   Procedure: CARDIOVERSION;  Surgeon: Fay Records, MD;  Location: [facility] ENDOSCOPY;  Service: Cardiovascular;  Laterality: N/A;  . COLONOSCOPY    . HERNIA REPAIR  07/10/12   LIH/rih-umb  . KNEE SURGERY  2005   Duda- arthroscopy, partial medial menisectomy-lt  . PROSTATE SURGERY  12/19/06   robotic prostatectomy  . rotator cuff surgery  2002   Gioffre-rt  . SHOULDER ARTHROSCOPY WITH ROTATOR CUFF REPAIR AND SUBACROMIAL DECOMPRESSION Left 07/10/2013   Procedure: LEFT SHOULDER ARTHROSCOPY WITH ARTHROSCOPIC ROTATOR CUFF REPAIR AND SUBACROMIAL DECOMPRESSION, PARTIAL ACROMIOPLASTY WITH CORACROMIAL RELEASE;  Surgeon: Johnny Bridge, MD;  Location: [facility];  Service: Orthopedics;  Laterality: Left;    No current outpatient medications on file.   No current facility-administered medications for this encounter.     Allergies  Allergen Reactions  . Flecainide Other (See Comments)    Dizziness and fatigue    Social History   Socioeconomic History  . Marital status: Married    Spouse name: Not on file  . Number of children: Not on file  . Years of education: Not on file  . Highest education level: Not on file  Occupational History  . Occupation: retired Licensed conveyancer)    Employer: [employer]  . Financial resource strain: Not on file  . Food insecurity:    Worry: Not on file    Inability: Not on file  . Transportation needs:    Medical: Not on file    Non-medical: Not on file  Tobacco Use  . Smoking status: Former Smoker    Last attempt to quit: 09/25/1971    Years since quitting: 47.2  . Smokeless tobacco: Never Used  Substance and Sexual Activity  . Alcohol use:  No  . Drug use: No  . Sexual activity: Not on file  Lifestyle  . Physical activity:    Days per week: Not on file    Minutes per session: Not on file  . Stress: Not on file  Relationships  . Social connections:    Talks on phone: Not on file    Gets together: Not on file    Attends religious service: Not on file    Active member of club or organization: Not on file    Attends meetings of clubs or organizations: Not on file    Relationship status: Not on file  . Intimate partner violence:    Fear of current or ex partner: Not on file    Emotionally abused: Not on file    Physically abused: Not on file    Forced sexual activity: Not on file  Other Topics Concern  . Not on file  Social History Narrative   Regular exercise: yes - runner 5d/wk, former marathon runner   Diet: no red meat, good water, fruits/vegetables daily     Family History  Problem Relation Age of Onset  . Breast cancer Mother   . Cancer Brother 80       brain tumor  . Heart disease Unknown        fam hx  . Arthritis Other        other relative  . Prostate cancer Other        nephew  . Lung cancer Other 43       nephew  . Arrhythmia Father     ROS- All systems are reviewed and negative except as per the HPI above  Physical Exam: Vitals:   12/02/18 0838  BP: 122/76  Pulse: 66  Weight: 80.7 kg  Height: 5' 9.5" (1.765 m)   Wt Readings from Last 3 Encounters:  12/02/18 80.7 kg  08/27/18 81.9 kg  08/27/18 81.9 kg    Labs: Lab Results  Component Value Date   NA 139 08/27/2018   K 4.7 08/27/2018   CL 102 08/27/2018   CO2 30 08/27/2018   GLUCOSE 91 08/27/2018   BUN 20 08/27/2018   CREATININE 1.11 08/27/2018   CALCIUM 10.1 08/27/2018   Lab Results  Component Value Date   INR 1.1 11/16/2016   Lab Results  Component Value Date   CHOL 203 (H) 08/27/2018   HDL 60.60 08/27/2018   LDLCALC 115 (H) 08/27/2018   TRIG 140.0 08/27/2018     GEN- The patient is well appearing, alert and  oriented x 3 today.   Head- normocephalic, atraumatic Eyes-  Sclera clear, conjunctiva pink Ears- hearing intact Oropharynx- clear Neck- supple, no JVP Lymph- no cervical lymphadenopathy Lungs- Clear to ausculation bilaterally, normal work of breathing Heart- regular rate and rhythm, no murmurs, rubs or gallops, PMI not laterally displaced GI- soft, NT, ND, + BS Extremities- no clubbing,  cyanosis, or edema MS- no significant deformity or atrophy Skin- no rash or lesion Psych- euthymic mood, full affect Neuro- strength and sensation are intact  EKG- NSR at 66 bpm, pr int 132 ms, qrs int 78 ms, qtc 406 ms Epic records reviewed    Assessment and Plan:  1. afib/flutter S/p ablation 02/26/17 with ERAF Did not tolerate flecainide   Used amiodarone short term Has been maintaining SR for some time  Now off all drugs [id number] score of 1 Encouraged regular exercise   F/u with Dr. [name] 05/2019  Geroge Baseman. Bela Nyborg, [facility] [address] [place], [place] [address] [phone]

## 2018-12-04 DIAGNOSIS — J3501 Chronic tonsillitis: Secondary | ICD-10-CM | POA: Diagnosis not present

## 2019-06-10 ENCOUNTER — Telehealth: Payer: Self-pay

## 2019-06-15 ENCOUNTER — Telehealth: Payer: Self-pay | Admitting: Family Medicine

## 2019-06-15 ENCOUNTER — Telehealth: Payer: Medicare Other | Admitting: Internal Medicine

## 2019-06-15 NOTE — Telephone Encounter (Signed)
Best number [phone] Pt has cpx labs 12/14 and wanted to make sure he get labs  for PSA and labs for dr borden his urology needs.  PT  would like a call back from donna

## 2019-06-16 ENCOUNTER — Other Ambulatory Visit: Payer: Self-pay | Admitting: Family Medicine

## 2019-06-16 DIAGNOSIS — R5383 Other fatigue: Secondary | ICD-10-CM

## 2019-06-16 DIAGNOSIS — Z8546 Personal history of malignant neoplasm of prostate: Secondary | ICD-10-CM

## 2019-06-16 DIAGNOSIS — E785 Hyperlipidemia, unspecified: Secondary | ICD-10-CM

## 2019-06-16 NOTE — Telephone Encounter (Signed)
Patient notified

## 2019-06-16 NOTE — Telephone Encounter (Signed)
12/14 future labs  FLP, E78.5  hyperlipidemia  Cbc with diff, HFP, BMET: fatigue other PSA: Total with reflex to free: history of prostate cancer  Can you him know we have set this up? He wanted a call

## 2019-06-17 DIAGNOSIS — L720 Epidermal cyst: Secondary | ICD-10-CM | POA: Diagnosis not present

## 2019-06-17 DIAGNOSIS — D1801 Hemangioma of skin and subcutaneous tissue: Secondary | ICD-10-CM | POA: Diagnosis not present

## 2019-06-17 DIAGNOSIS — Z85828 Personal history of other malignant neoplasm of skin: Secondary | ICD-10-CM | POA: Diagnosis not present

## 2019-06-17 DIAGNOSIS — L821 Other seborrheic keratosis: Secondary | ICD-10-CM | POA: Diagnosis not present

## 2019-06-18 ENCOUNTER — Ambulatory Visit (INDEPENDENT_AMBULATORY_CARE_PROVIDER_SITE_OTHER): Payer: Medicare Other | Admitting: Family Medicine

## 2019-06-18 ENCOUNTER — Encounter: Payer: Self-pay | Admitting: Family Medicine

## 2019-06-18 ENCOUNTER — Other Ambulatory Visit: Payer: Self-pay

## 2019-06-18 ENCOUNTER — Ambulatory Visit (INDEPENDENT_AMBULATORY_CARE_PROVIDER_SITE_OTHER)
Admission: RE | Admit: 2019-06-18 | Discharge: 2019-06-18 | Disposition: A | Discharge status: Home / Self Care | Payer: Medicare Other | Source: Ambulatory Visit | Attending: Family Medicine | Admitting: Family Medicine

## 2019-06-18 VITALS — BP 90/60 | HR 56 | Temp 97.6°F | Ht 69.5 in | Wt 176.0 lb

## 2019-06-18 DIAGNOSIS — M7989 Other specified soft tissue disorders: Secondary | ICD-10-CM | POA: Diagnosis not present

## 2019-06-18 DIAGNOSIS — M79641 Pain in right hand: Secondary | ICD-10-CM | POA: Diagnosis not present

## 2019-06-18 DIAGNOSIS — M1811 Unilateral primary osteoarthritis of first carpometacarpal joint, right hand: Secondary | ICD-10-CM | POA: Diagnosis not present

## 2019-06-18 NOTE — Progress Notes (Signed)
Shalandra Leu T. Natallie Ravenscroft, MD Primary Care and [place] at [facility] [place], [address] Phone: [phone]  FAX: [place] - 73 y.o. male  MRN [id number]  Date of Birth: 05/20/1946  Visit Date: 06/18/2019  PCP: [name], MD  Referred by: [name], MD  No chief complaint on file.  Subjective:   Derek [name] is a 73 y.o. very pleasant male patient who presents with the following:  He was working in the yard and garden through the weekend, now is gotten some relatively diffuse swelling in his fingers and in the hand but isolated to this region on the right.  It does have some pain, but not focally, he does have some pain in the true wrist.  He does not have any pain proximal to this at all.  Past Medical History, Surgical History, Social History, Family History, Problem List, Medications, and Allergies have been reviewed and updated if relevant.  Patient Active Problem List   Diagnosis Date Noted  . Health maintenance examination 08/27/2018  . Advanced directives, counseling/discussion 08/27/2018  . History of atrial fibrillation 10/30/2016  . Left rotator cuff tear 07/10/2013  . Scrotal discoloration / purpling ?varicocele 09/08/2012  . Bilateral inguinal hernia (BIH) s/p lap repair 07/06/2012 06/24/2012  . PROSTATE CANCER, HX OF 11/10/2008  . CARCINOMA, BASAL CELL, HX OF 11/10/2008  . RHEUMATIC FEVER, HX OF 11/10/2008  . DIVERTICULITIS, HX OF 11/10/2008    Past Medical History:  Diagnosis Date  . Basal cell carcinoma 2009   back  . Cancer of prostate ([facility])    pT2c No Mx, Gleason 3+4=7  . Chronic systolic dysfunction of left ventricle    EF 45% by echo 2/18  . Diverticulitis   . Hypogonadism male   . Kidney stones   . Left rotator cuff tear 07/10/2013  . Nephrolithiasis   . Paroxysmal atrial fibrillation ([facility])   . Rheumatic fever 1952-1953    Past Surgical History:   Procedure Laterality Date  . ABLATION OF DYSRHYTHMIC FOCUS  02/26/2017  . ATRIAL FIBRILLATION ABLATION N/A 02/26/2017   Procedure: Atrial Fibrillation Ablation;  Surgeon: Thompson Grayer, MD;  Location: [facility];  Service: Cardiovascular;  Laterality: N/A;  . bilateral inguinal hernia repair  07/10/2012   lap BIH repairs  . CARDIOVERSION N/A 03/07/2017   Procedure: CARDIOVERSION;  Surgeon: Fay Records, MD;  Location: [facility] ENDOSCOPY;  Service: Cardiovascular;  Laterality: N/A;  . COLONOSCOPY    . HERNIA REPAIR  07/10/12   LIH/rih-umb  . KNEE SURGERY  2005   Duda- arthroscopy, partial medial menisectomy-lt  . PROSTATE SURGERY  12/19/06   robotic prostatectomy  . rotator cuff surgery  2002   Gioffre-rt  . SHOULDER ARTHROSCOPY WITH ROTATOR CUFF REPAIR AND SUBACROMIAL DECOMPRESSION Left 07/10/2013   Procedure: LEFT SHOULDER ARTHROSCOPY WITH ARTHROSCOPIC ROTATOR CUFF REPAIR AND SUBACROMIAL DECOMPRESSION, PARTIAL ACROMIOPLASTY WITH CORACROMIAL RELEASE;  Surgeon: Johnny Bridge, MD;  Location: [facility];  Service: Orthopedics;  Laterality: Left;    Social History   Socioeconomic History  . Marital status: Married    Spouse name: Not on file  . Number of children: Not on file  . Years of education: Not on file  . Highest education level: Not on file  Occupational History  . Occupation: retired Licensed conveyancer)    Employer: [employer]  . Financial resource strain: Not on file  .  Food insecurity    Worry: Not on file    Inability: Not on file  . Transportation needs    Medical: Not on file    Non-medical: Not on file  Tobacco Use  . Smoking status: Former Smoker    Quit date: 09/25/1971    Years since quitting: 47.7  . Smokeless tobacco: Never Used  Substance and Sexual Activity  . Alcohol use: No  . Drug use: No  . Sexual activity: Not on file  Lifestyle  . Physical activity    Days per week: Not on file    Minutes per session: Not  on file  . Stress: Not on file  Relationships  . Social Herbalist on phone: Not on file    Gets together: Not on file    Attends religious service: Not on file    Active member of club or organization: Not on file    Attends meetings of clubs or organizations: Not on file    Relationship status: Not on file  . Intimate partner violence    Fear of current or ex partner: Not on file    Emotionally abused: Not on file    Physically abused: Not on file    Forced sexual activity: Not on file  Other Topics Concern  . Not on file  Social History Narrative   Regular exercise: yes - runner 5d/wk, former marathon runner   Diet: no red meat, good water, fruits/vegetables daily     Family History  Problem Relation Age of Onset  . Breast cancer Mother   . Cancer Brother 85       brain tumor  . Heart disease Unknown        fam hx  . Arthritis Other        other relative  . Prostate cancer Other        nephew  . Lung cancer Other 8       nephew  . Arrhythmia Father     Allergies  Allergen Reactions  . Flecainide Other (See Comments)    Dizziness and fatigue    Medication list reviewed and updated in full in [facility].   GEN: No acute illnesses, no fevers, chills. GI: No n/v/d, eating normally Pulm: No SOB Interactive and getting along well at home.  Otherwise, ROS is as per the HPI.  Objective:   BP 90/60   Pulse (!) 56   Temp 97.6 F (36.4 C) (Temporal)   Ht 5' 9.5" (1.765 m)   Wt 176 lb (79.8 kg)   SpO2 99%   BMI 25.62 kg/m   GEN: WDWN, NAD, Non-toxic, A & O x 3 HEENT: Atraumatic, Normocephalic. Neck supple. No masses, No LAD. Ears and Nose: No external deformity. EXTR: No c/c/e NEURO Normal gait.  PSYCH: Normally interactive. Conversant. Not depressed or anxious appearing.  Calm demeanor.   No focal bony tenderness.  Does have some pain with palpation at the true wrist joint.  He is unable to make a full composite fist.  He has some  swelling relatively diffusely in the hand and in the fingers.  Laboratory and Imaging Data: Dg Hand Complete Right  Result Date: 06/18/2019 CLINICAL DATA:  Onset right hand swelling 4-5 days ago. No known injury. EXAM: RIGHT HAND - COMPLETE 3+ VIEW COMPARISON:  None. FINDINGS: Soft tissues appear swollen. No soft tissue gas or radiopaque foreign body. No acute or focal bony abnormality. 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 IMPRESSION: Soft tissue swelling without underlying acute bony abnormality. Negative for foreign body or soft tissue gas. 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 Electronically Signed   By: Inge Rise M.D.   On: 06/18/2019 15:01     Assessment and Plan:     ICD-10-CM   1. Right hand pain  M79.641 DG Hand Complete Right   Unclear etiology, continue icing as well as anti-inflammatories.  I suspect that this will improve with time alone.  Follow-up: No follow-ups on file.  No orders of the defined types were placed in this encounter.  Orders Placed This Encounter  Procedures  . DG Hand Complete Right    Signed,  Yuko Coventry T. Rosendo Couser, MD   No outpatient encounter medications on file as of 06/18/2019.   No facility-administered encounter medications on file as of 06/18/2019.

## 2019-07-07 ENCOUNTER — Ambulatory Visit (INDEPENDENT_AMBULATORY_CARE_PROVIDER_SITE_OTHER): Payer: Medicare Other

## 2019-07-07 DIAGNOSIS — Z23 Encounter for immunization: Secondary | ICD-10-CM | POA: Diagnosis not present

## 2019-08-05 ENCOUNTER — Telehealth: Payer: Self-pay | Admitting: Internal Medicine

## 2019-08-05 NOTE — Telephone Encounter (Signed)
History of rheumatic fever is not an indication for dental procedure prophylaxis per the American [employer] Association. If the dentist believes there is an infection than he should prescribe the patient antibiotics. Will send to Dr. [name] for his input

## 2019-08-05 NOTE — Telephone Encounter (Signed)
New Message     Pt says he is needing to get a crown replacement. He says he thinks he needs to pre med before the procedure and before scheduling the procedure he wanted to check with Dr [name]    Please call

## 2019-08-05 NOTE — Telephone Encounter (Signed)
Pt calling office in regards to upcoming dental appt.  Per Pt he is needing a crown replaced.  His dentist wants him to be premedicated d/t hx of rheumatic fever and likely infection in tooth.  Request for medication order sent to [name], she states she hasn't prescribed an antibiotic for dental work in years and requests this nurse send request to PharmD for advisement.

## 2019-08-05 NOTE — Telephone Encounter (Signed)
I agree

## 2019-08-07 ENCOUNTER — Telehealth: Payer: Self-pay

## 2019-08-07 NOTE — Telephone Encounter (Signed)
Left detailed message per DPR.  Advised no cardiac indication for prophylactic antibiotic.  Advised to call dentist and if dentist wants him to take an antibiotic he can order.  Advised to call office if this nurse needs to communicate this information with dentist.

## 2019-08-07 NOTE — Telephone Encounter (Signed)
error 

## 2019-08-31 DIAGNOSIS — M519 Unspecified thoracic, thoracolumbar and lumbosacral intervertebral disc disorder: Secondary | ICD-10-CM | POA: Diagnosis not present

## 2019-08-31 DIAGNOSIS — M542 Cervicalgia: Secondary | ICD-10-CM | POA: Diagnosis not present

## 2019-09-03 ENCOUNTER — Telehealth: Payer: Self-pay

## 2019-09-03 NOTE — Telephone Encounter (Signed)
LVM w COVID screen, front door and back lab info 12.10.2020 TLJ

## 2019-09-07 ENCOUNTER — Ambulatory Visit: Payer: Medicare Other

## 2019-09-07 ENCOUNTER — Ambulatory Visit (INDEPENDENT_AMBULATORY_CARE_PROVIDER_SITE_OTHER): Payer: Medicare Other

## 2019-09-07 ENCOUNTER — Other Ambulatory Visit (INDEPENDENT_AMBULATORY_CARE_PROVIDER_SITE_OTHER): Payer: Medicare Other

## 2019-09-07 ENCOUNTER — Other Ambulatory Visit: Payer: Self-pay

## 2019-09-07 VITALS — HR 62

## 2019-09-07 DIAGNOSIS — Z0000 Encounter for general adult medical examination without abnormal findings: Secondary | ICD-10-CM | POA: Diagnosis not present

## 2019-09-07 DIAGNOSIS — R5383 Other fatigue: Secondary | ICD-10-CM

## 2019-09-07 DIAGNOSIS — E785 Hyperlipidemia, unspecified: Secondary | ICD-10-CM | POA: Diagnosis not present

## 2019-09-07 DIAGNOSIS — Z8546 Personal history of malignant neoplasm of prostate: Secondary | ICD-10-CM

## 2019-09-07 LAB — CBC WITH DIFFERENTIAL/PLATELET
Basophils Absolute: 0.1 10*3/uL (ref 0.0–0.1)
Basophils Relative: 1 % (ref 0.0–3.0)
Eosinophils Absolute: 0.3 10*3/uL (ref 0.0–0.7)
Eosinophils Relative: 3.4 % (ref 0.0–5.0)
HCT: 43.1 % (ref 39.0–52.0)
Hemoglobin: 14.5 g/dL (ref 13.0–17.0)
Lymphocytes Relative: 41.8 % (ref 12.0–46.0)
Lymphs Abs: 3.3 10*3/uL (ref 0.7–4.0)
MCHC: 33.6 g/dL (ref 30.0–36.0)
MCV: 94.4 fl (ref 78.0–100.0)
Monocytes Absolute: 0.5 10*3/uL (ref 0.1–1.0)
Monocytes Relative: 6.5 % (ref 3.0–12.0)
Neutro Abs: 3.7 10*3/uL (ref 1.4–7.7)
Neutrophils Relative %: 47.3 % (ref 43.0–77.0)
Platelets: 270 10*3/uL (ref 150.0–400.0)
RBC: 4.56 Mil/uL (ref 4.22–5.81)
RDW: 13.7 % (ref 11.5–15.5)
WBC: 7.9 10*3/uL (ref 4.0–10.5)

## 2019-09-07 LAB — BASIC METABOLIC PANEL
BUN: 23 mg/dL (ref 6–23)
CO2: 30 mEq/L (ref 19–32)
Calcium: 9.7 mg/dL (ref 8.4–10.5)
Chloride: 105 mEq/L (ref 96–112)
Creatinine, Ser: 1.13 mg/dL (ref 0.40–1.50)
GFR: 63.16 mL/min (ref >60.00)
Glucose, Bld: 82 mg/dL (ref 70–99)
Potassium: 4.4 mEq/L (ref 3.5–5.1)
Sodium: 142 mEq/L (ref 135–145)

## 2019-09-07 LAB — LIPID PANEL
Cholesterol: 205 mg/dL — ABNORMAL HIGH (ref 0–200)
HDL: 78.2 mg/dL (ref >39.00)
LDL Cholesterol: 110 mg/dL — ABNORMAL HIGH (ref 0–99)
NonHDL: 126.36
Total CHOL/HDL Ratio: 3
Triglycerides: 83 mg/dL (ref 0.0–149.0)
VLDL: 16.6 mg/dL (ref 0.0–40.0)

## 2019-09-07 LAB — HEPATIC FUNCTION PANEL
ALT: 21 U/L (ref 0–53)
AST: 23 U/L (ref 0–37)
Albumin: 4.3 g/dL (ref 3.5–5.2)
Alkaline Phosphatase: 76 U/L (ref 39–117)
Bilirubin, Direct: 0.1 mg/dL (ref 0.0–0.3)
Total Bilirubin: 0.5 mg/dL (ref 0.2–1.2)
Total Protein: 6.6 g/dL (ref 6.0–8.3)

## 2019-09-07 NOTE — Progress Notes (Signed)
PCP notes:  Health Maintenance: No gaps    Abnormal Screenings: none   Patient concerns: none   Nurse concerns: none   Next PCP appt.: 09/09/2019 @ 8:20 am

## 2019-09-07 NOTE — Patient Instructions (Signed)
Derek [name] , Thank you for taking time to come for your Medicare Wellness Visit. I appreciate your ongoing commitment to your health goals. Please review the following plan we discussed and let me know if I can assist you in the future.   Screening recommendations/referrals: Colonoscopy: Up to date, completed 07/01/2018 Recommended yearly ophthalmology/optometry visit for glaucoma screening and checkup Recommended yearly dental visit for hygiene and checkup  Vaccinations: Influenza vaccine: Up to date, completed 07/07/2019 Pneumococcal vaccine: Completed series Tdap vaccine: Up to date, completed 12/22/2014 Shingles vaccine: Completed series    Advanced directives: Please bring a copy of your POA (Power of Attorney) and/or Living Will to your next appointment.   Conditions/risks identified: none  Next appointment: 09/09/2019 @ 8:20 am   Preventive Care 65 Years and Older, Male Preventive care refers to lifestyle choices and visits with your health care provider that can promote health and wellness. What does preventive care include?  A yearly physical exam. This is also called an annual well check.  Dental exams once or twice a year.  Routine eye exams. Ask your health care provider how often you should have your eyes checked.  Personal lifestyle choices, including:  Daily care of your teeth and gums.  Regular physical activity.  Eating a healthy diet.  Avoiding tobacco and drug use.  Limiting alcohol use.  Practicing safe sex.  Taking low doses of aspirin every day.  Taking vitamin and mineral supplements as recommended by your health care provider. What happens during an annual well check? The services and screenings done by your health care provider during your annual well check will depend on your age, overall health, lifestyle risk factors, and family history of disease. Counseling  Your health care provider may ask you questions about your:  Alcohol use.   Tobacco use.  Drug use.  Emotional well-being.  Home and relationship well-being.  Sexual activity.  Eating habits.  History of falls.  Memory and ability to understand (cognition).  Work and work Statistician. Screening  You may have the following tests or measurements:  Height, weight, and BMI.  Blood pressure.  Lipid and cholesterol levels. These may be checked every 5 years, or more frequently if you are over 1 years old.  Skin check.  Lung cancer screening. You may have this screening every year starting at age 22 if you have a 30-pack-year history of smoking and currently smoke or have quit within the past 15 years.  Fecal occult blood test (FOBT) of the stool. You may have this test every year starting at age 48.  Flexible sigmoidoscopy or colonoscopy. You may have a sigmoidoscopy every 5 years or a colonoscopy every 10 years starting at age 67.  Prostate cancer screening. Recommendations will vary depending on your family history and other risks.  Hepatitis C blood test.  Hepatitis B blood test.  Sexually transmitted disease (STD) testing.  Diabetes screening. This is done by checking your blood sugar (glucose) after you have not eaten for a while (fasting). You may have this done every 1-3 years.  Abdominal aortic aneurysm (AAA) screening. You may need this if you are a current or former smoker.  Osteoporosis. You may be screened starting at age 42 if you are at high risk. Talk with your health care provider about your test results, treatment options, and if necessary, the need for more tests. Vaccines  Your health care provider may recommend certain vaccines, such as:  Influenza vaccine. This is recommended every year.  Tetanus, diphtheria, and acellular pertussis (Tdap, Td) vaccine. You may need a Td booster every 10 years.  Zoster vaccine. You may need this after age 82.  Pneumococcal 13-valent conjugate (PCV13) vaccine. One dose is recommended  after age 74.  Pneumococcal polysaccharide (PPSV23) vaccine. One dose is recommended after age 59. Talk to your health care provider about which screenings and vaccines you need and how often you need them. This information is not intended to replace advice given to you by your health care provider. Make sure you discuss any questions you have with your health care provider. Document Released: 10/07/2015 Document Revised: 05/30/2016 Document Reviewed: 07/12/2015 Elsevier Interactive Patient Education  2017 [place] Prevention in the Home Falls can cause injuries. They can happen to people of all ages. There are many things you can do to make your home safe and to help prevent falls. What can I do on the outside of my home?  Regularly fix the edges of walkways and driveways and fix any cracks.  Remove anything that might make you trip as you walk through a door, such as a raised step or threshold.  Trim any bushes or trees on the path to your home.  Use bright outdoor lighting.  Clear any walking paths of anything that might make someone trip, such as rocks or tools.  Regularly check to see if handrails are loose or broken. Make sure that both sides of any steps have handrails.  Any raised decks and porches should have guardrails on the edges.  Have any leaves, snow, or ice cleared regularly.  Use sand or salt on walking paths during winter.  Clean up any spills in your garage right away. This includes oil or grease spills. What can I do in the bathroom?  Use night lights.  Install grab bars by the toilet and in the tub and shower. Do not use towel bars as grab bars.  Use non-skid mats or decals in the tub or shower.  If you need to sit down in the shower, use a plastic, non-slip stool.  Keep the floor dry. Clean up any water that spills on the floor as soon as it happens.  Remove soap buildup in the tub or shower regularly.  Attach bath mats securely with  double-sided non-slip rug tape.  Do not have throw rugs and other things on the floor that can make you trip. What can I do in the bedroom?  Use night lights.  Make sure that you have a light by your bed that is easy to reach.  Do not use any sheets or blankets that are too big for your bed. They should not hang down onto the floor.  Have a firm chair that has side arms. You can use this for support while you get dressed.  Do not have throw rugs and other things on the floor that can make you trip. What can I do in the kitchen?  Clean up any spills right away.  Avoid walking on wet floors.  Keep items that you use a lot in easy-to-reach places.  If you need to reach something above you, use a strong step stool that has a grab bar.  Keep electrical cords out of the way.  Do not use floor polish or wax that makes floors slippery. If you must use wax, use non-skid floor wax.  Do not have throw rugs and other things on the floor that can make you trip. What can I do  with my stairs?  Do not leave any items on the stairs.  Make sure that there are handrails on both sides of the stairs and use them. Fix handrails that are broken or loose. Make sure that handrails are as long as the stairways.  Check any carpeting to make sure that it is firmly attached to the stairs. Fix any carpet that is loose or worn.  Avoid having throw rugs at the top or bottom of the stairs. If you do have throw rugs, attach them to the floor with carpet tape.  Make sure that you have a light switch at the top of the stairs and the bottom of the stairs. If you do not have them, ask someone to add them for you. What else can I do to help prevent falls?  Wear shoes that:  Do not have high heels.  Have rubber bottoms.  Are comfortable and fit you well.  Are closed at the toe. Do not wear sandals.  If you use a stepladder:  Make sure that it is fully opened. Do not climb a closed stepladder.  Make  sure that both sides of the stepladder are locked into place.  Ask someone to hold it for you, if possible.  Clearly mark and make sure that you can see:  Any grab bars or handrails.  First and last steps.  Where the edge of each step is.  Use tools that help you move around (mobility aids) if they are needed. These include:  Canes.  Walkers.  Scooters.  Crutches.  Turn on the lights when you go into a dark area. Replace any light bulbs as soon as they burn out.  Set up your furniture so you have a clear path. Avoid moving your furniture around.  If any of your floors are uneven, fix them.  If there are any pets around you, be aware of where they are.  Review your medicines with your doctor. Some medicines can make you feel dizzy. This can increase your chance of falling. Ask your doctor what other things that you can do to help prevent falls. This information is not intended to replace advice given to you by your health care provider. Make sure you discuss any questions you have with your health care provider. Document Released: 07/07/2009 Document Revised: 02/16/2016 Document Reviewed: 10/15/2014 Elsevier Interactive Patient Education  2017 [employer].

## 2019-09-07 NOTE — Progress Notes (Signed)
Subjective:   Derek [name] is a 73 y.o. male who presents for Medicare Annual/Subsequent preventive examination.  Review of Systems: N/A   This visit is being conducted through telemedicine via telephone at the nurse health advisor's home address due to the COVID-19 pandemic. This patient has given me verbal consent via doximity to conduct this visit, patient states they are participating from their home address. Patient and myself are on the telephone call. There is no referral for this visit. Some vital signs may be absent or patient reported.    Patient identification: identified by name, DOB, and current address   Cardiac Risk Factors include: advanced age (>16men, >51 women);male gender     Objective:    Vitals: Pulse 62   SpO2 96%   There is no height or weight on file to calculate BMI.  Advanced Directives 09/07/2019 08/27/2018 07/05/2017 03/07/2017 02/26/2017 07/07/2013 06/25/2013  Does Patient Have a Medical Advance Directive? Yes Yes Yes Yes Yes Patient has advance directive, copy not in chart Patient has advance directive, copy not in chart  Type of Advance Directive [place];Living will [name];Living will [place];Living will Living will;Healthcare Power of [place];Living will Living will;Healthcare Power of Attorney  Does patient want to make changes to medical advance directive? - - - - No - Patient declined - -  Copy of [place] in Chart? No - copy requested Yes - validated most recent copy scanned in chart (See row information) No - copy requested No - copy requested No - copy requested - -    Tobacco Social History   Tobacco Use  Smoking Status Former Smoker  . Quit date: 09/25/1971  . Years since quitting: 47.9  Smokeless Tobacco Never Used     Counseling given: Not Answered   Clinical Intake:  Pre-visit preparation  completed: Yes  Pain : No/denies pain     Nutritional Risks: None Diabetes: No  How often do you need to have someone help you when you read instructions, pamphlets, or other written materials from your doctor or pharmacy?: 1 - Never What is the last grade level you completed in school?: Associate degree  Interpreter Needed?: No  Information entered by :: CJohnson, LPN  Past Medical History:  Diagnosis Date  . Basal cell carcinoma 2009   back  . Cancer of prostate ([facility])    pT2c No Mx, Gleason 3+4=7  . Chronic systolic dysfunction of left ventricle    EF 45% by echo 2/18  . Diverticulitis   . Hypogonadism male   . Kidney stones   . Left rotator cuff tear 07/10/2013  . Nephrolithiasis   . Paroxysmal atrial fibrillation ([facility])   . Rheumatic fever 1952-1953   Past Surgical History:  Procedure Laterality Date  . ABLATION OF DYSRHYTHMIC FOCUS  02/26/2017  . ATRIAL FIBRILLATION ABLATION N/A 02/26/2017   Procedure: Atrial Fibrillation Ablation;  Surgeon: Thompson Grayer, MD;  Location: [facility];  Service: Cardiovascular;  Laterality: N/A;  . bilateral inguinal hernia repair  07/10/2012   lap BIH repairs  . CARDIOVERSION N/A 03/07/2017   Procedure: CARDIOVERSION;  Surgeon: Fay Records, MD;  Location[facility] ENDOSCOPY;  Service: Cardiovascular;  Laterality: N/A;  . COLONOSCOPY    . HERNIA REPAIR  07/10/12   LIH/rih-umb  . KNEE SURGERY  2005   Duda- arthroscopy, partial medial menisectomy-lt  . PROSTATE SURGERY  12/19/06  robotic prostatectomy  . rotator cuff surgery  2002   Gioffre-rt  . SHOULDER ARTHROSCOPY WITH ROTATOR CUFF REPAIR AND SUBACROMIAL DECOMPRESSION Left 07/10/2013   Procedure: LEFT SHOULDER ARTHROSCOPY WITH ARTHROSCOPIC ROTATOR CUFF REPAIR AND SUBACROMIAL DECOMPRESSION, PARTIAL ACROMIOPLASTY WITH CORACROMIAL RELEASE;  Surgeon: Johnny Bridge, MD;  Location: [facility];  Service: Orthopedics;  Laterality: Left;   Family History  Problem  Relation Age of Onset  . Breast cancer Mother   . Cancer Brother 69       brain tumor  . Heart disease Other        fam hx  . Arthritis Other        other relative  . Prostate cancer Other        nephew  . Lung cancer Other 10       nephew  . Arrhythmia Father    Social History   Socioeconomic History  . Marital status: Married    Spouse name: Not on file  . Number of children: Not on file  . Years of education: Not on file  . Highest education level: Not on file  Occupational History  . Occupation: retired Licensed conveyancer)    Employer: [employer]  Tobacco Use  . Smoking status: Former Smoker    Quit date: 09/25/1971    Years since quitting: 47.9  . Smokeless tobacco: Never Used  Substance and Sexual Activity  . Alcohol use: No  . Drug use: No  . Sexual activity: Not on file  Other Topics Concern  . Not on file  Social History Narrative   Regular exercise: yes - runner 5d/wk, former marathon runner   Diet: no red meat, good water, fruits/vegetables daily    Social Determinants of Health   Financial Resource Strain: Low Risk   . Difficulty of Paying Living Expenses: Not very hard  Food Insecurity: No Food Insecurity  . Worried About Charity fundraiser in the Last Year: Never true  . Ran Out of Food in the Last Year: Never true  Transportation Needs: No Transportation Needs  . Lack of Transportation (Medical): No  . Lack of Transportation (Non-Medical): No  Physical Activity: Sufficiently Active  . Days of Exercise per Week: 4 days  . Minutes of Exercise per Session: 50 min  Stress: No Stress Concern Present  . Feeling of Stress : Not at all  Social Connections:   . Frequency of Communication with Friends and Family: Not on file  . Frequency of Social Gatherings with Friends and Family: Not on file  . Attends Religious Services: Not on file  . Active Member of Clubs or Organizations: Not on file  . Attends Archivist Meetings: Not on file    . Marital Status: Not on file    No outpatient encounter medications on file as of 09/07/2019.   No facility-administered encounter medications on file as of 09/07/2019.    Activities of Daily Living In your present state of health, do you have any difficulty performing the following activities: 09/07/2019  Hearing? N  Vision? N  Difficulty concentrating or making decisions? N  Walking or climbing stairs? N  Dressing or bathing? N  Doing errands, shopping? N  Preparing Food and eating ? N  Using the Toilet? N  In the past six months, have you accidently leaked urine? N  Do you have problems with loss of bowel control? N  Managing your Medications? N  Managing your Finances? N  Housekeeping or  managing your Housekeeping? N  Some recent data might be hidden    Patient Care Team: [name], MD as PCP - General Raynelle Bring, MD as Consulting Physician (Urology) Danella Sensing, MD as Consulting Physician (Dermatology) Rutherford Guys, MD as Consulting Physician (Ophthalmology) Thompson Grayer, MD as Consulting Physician (Cardiology) Javier Docker. Dalbert Mayotte., DMD as Referring Physician (Dentistry)   Assessment:   This is a routine wellness examination for Dandrew.  Exercise Activities and Dietary recommendations Current Exercise Habits: Home exercise routine, Type of exercise: strength training/weights(running), Time (Minutes): 50, Frequency (Times/Week): 4, Weekly Exercise (Minutes/Week): 200, Intensity: Intense, Exercise limited by: None identified  Goals    . Increase physical activity     Starting 08/27/2018, I will continue to exercise for at least 70 minutes 4-5 days per week.    . Patient Stated     09/07/2019, I will maintain and continue exercising.        Fall Risk Fall Risk  09/07/2019 08/27/2018 07/05/2017 07/02/2016 06/20/2015  Falls in the past year? 0 0 No No No  Number falls in past yr: 0 - - - -  Injury with Fall? 0 - - - -  Risk for fall due to :  Medication side effect - - - -  Follow up Falls evaluation completed;Falls prevention discussed - - - -   Is the patient's home free of loose throw rugs in walkways, pet beds, electrical cords, etc?   yes      Grab bars in the bathroom? no      Handrails on the stairs?   yes      Adequate lighting?   yes  Timed Get Up and Go Performed: N/A  Depression Screen PHQ 2/9 Scores 09/07/2019 08/27/2018 07/05/2017 07/02/2016  PHQ - 2 Score 0 0 0 0  PHQ- 9 Score 0 0 0 -    Cognitive Function MMSE - Mini Mental State Exam 09/07/2019 08/27/2018 07/05/2017  Orientation to time 5 5 5   Orientation to Place 5 5 5   Registration 3 3 3   Attention/ Calculation 5 0 0  Recall 3 3 3   Language- name 2 objects - 0 0  Language- repeat 1 1 1   Language- follow 3 step command - 3 3  Language- read & follow direction - 0 0  Write a sentence - 0 0  Copy design - 0 0  Total score - 20 20  Mini Cog  Mini-Cog screen was completed. Maximum score is 22. A value of 0 denotes this part of the MMSE was not completed or the patient failed this part of the Mini-Cog screening.       Immunization History  Administered Date(s) Administered  . Fluad Quad(high Dose 65+) 07/07/2019  . Influenza,inj,Quad PF,6+ Mos 06/25/2013, 06/16/2014, 07/21/2015, 07/02/2016, 07/22/2017, 08/27/2018  . Pneumococcal Conjugate-13 05/03/2015  . Pneumococcal Polysaccharide-23 08/14/2006, 06/25/2013  . Tdap 12/22/2014  . Zoster 08/14/2006  . Zoster Recombinat (Shingrix) 07/08/2018, 09/09/2018    Qualifies for Shingles Vaccine? Completed series  Screening Tests Health Maintenance  Topic Date Due  . COLONOSCOPY  07/02/2023  . TETANUS/TDAP  12/21/2024  . INFLUENZA VACCINE  Completed  . Hepatitis C Screening  Completed  . PNA vac Low Risk Adult  Completed   Cancer Screenings: Lung: Low Dose CT Chest recommended if Age 41-80 years, 30 pack-year currently smoking OR have quit w/in 15years. Patient does not qualify. Colorectal:  completed 07/01/2018  Additional Screenings:  Hepatitis C Screening: 06/25/2016      Plan:  Patient will maintain and continue medications  I have personally reviewed and noted the following in the patient's chart:   . Medical and social history . Use of alcohol, tobacco or illicit drugs  . Current medications and supplements . Functional ability and status . Nutritional status . Physical activity . Advanced directives . List of other physicians . Hospitalizations, surgeries, and ER visits in previous 12 months . Vitals . Screenings to include cognitive, depression, and falls . Referrals and appointments  In addition, I have reviewed and discussed with patient certain preventive protocols, quality metrics, and best practice recommendations. A written personalized care plan for preventive services as well as general preventive health recommendations were provided to patient.     Andrez Grime, LPN  [id number]

## 2019-09-08 LAB — PSA, TOTAL WITH REFLEX TO PSA, FREE: PSA, Total: 0.1 ng/mL (ref <=4.0)

## 2019-09-09 ENCOUNTER — Ambulatory Visit (INDEPENDENT_AMBULATORY_CARE_PROVIDER_SITE_OTHER): Payer: Medicare Other | Admitting: Family Medicine

## 2019-09-09 ENCOUNTER — Other Ambulatory Visit: Payer: Self-pay

## 2019-09-09 ENCOUNTER — Encounter: Payer: Self-pay | Admitting: Family Medicine

## 2019-09-09 VITALS — BP 100/62 | HR 56 | Temp 97.0°F | Ht 68.5 in | Wt 173.0 lb

## 2019-09-09 DIAGNOSIS — Z7182 Exercise counseling: Secondary | ICD-10-CM

## 2019-09-09 DIAGNOSIS — Z713 Dietary counseling and surveillance: Secondary | ICD-10-CM | POA: Diagnosis not present

## 2019-09-09 NOTE — Progress Notes (Signed)
[name], MD Primary Care and [place] at [facility] [place], [address] Phone: [phone]  FAX: [place] - 73 y.o. male  MRN [id number]  Date of Birth: 1946/06/11  Visit Date: 09/09/2019  PCP: [name], MD  Referred by: [name], MD  Chief Complaint  Patient presents with  . Annual Exam    Part 2    This visit occurred during the SARS-CoV-2 public health emergency.  Safety protocols were in place, including screening questions prior to the visit, additional usage of staff PPE, and extensive cleaning of exam room while observing appropriate contact time as indicated for disinfecting solutions.   Subjective:   Derek [name] is a 73 y.o. very pleasant male patient who presents with the following:   Going to the park and still running. Everything has been doing OK.  Still working out.   Oct / Nov, was putting some things in his ground and went and saw Dr. Tonita Cong and gave him a course of prednisone.   He is generally quite active and he still runs routinely.  His BMI is appropriate, his blood pressure is totally normal.  Currently not taking any medication.  Health Maintenance  Topic Date Due  . COLONOSCOPY  07/02/2023  . TETANUS/TDAP  12/21/2024  . INFLUENZA VACCINE  Completed  . Hepatitis C Screening  Completed  . PNA vac Low Risk Adult  Completed    Immunization History  Administered Date(s) Administered  . Fluad Quad(high Dose 65+) 07/07/2019  . Influenza,inj,Quad PF,6+ Mos 06/25/2013, 06/16/2014, 07/21/2015, 07/02/2016, 07/22/2017, 08/27/2018  . Pneumococcal Conjugate-13 05/03/2015  . Pneumococcal Polysaccharide-23 08/14/2006, 06/25/2013  . Tdap 12/22/2014  . Zoster 08/14/2006  . Zoster Recombinat (Shingrix) 07/08/2018, 09/09/2018     Past Medical History, Surgical History, Social History, Family History, Problem List, Medications, and Allergies have been  reviewed and updated if relevant.  Patient Active Problem List   Diagnosis Date Noted  . Advanced directives, counseling/discussion 08/27/2018  . History of atrial fibrillation 10/30/2016  . Left rotator cuff tear 07/10/2013  . Scrotal discoloration / purpling ?varicocele 09/08/2012  . Bilateral inguinal hernia (BIH) s/p lap repair 07/06/2012 06/24/2012  . PROSTATE CANCER, HX OF 11/10/2008  . CARCINOMA, BASAL CELL, HX OF 11/10/2008  . RHEUMATIC FEVER, HX OF 11/10/2008  . DIVERTICULITIS, HX OF 11/10/2008    Past Medical History:  Diagnosis Date  . Basal cell carcinoma 2009   back  . Cancer of prostate ([place])    pT2c No Mx, Gleason 3+4=7  . Chronic systolic dysfunction of left ventricle    EF 45% by echo 2/18  . Diverticulitis   . Hypogonadism male   . Kidney stones   . Left rotator cuff tear 07/10/2013  . Nephrolithiasis   . Paroxysmal atrial fibrillation (HCC)   . Rheumatic fever 1952-1953    Past Surgical History:  Procedure Laterality Date  . ABLATION OF DYSRHYTHMIC FOCUS  02/26/2017  . ATRIAL FIBRILLATION ABLATION N/A 02/26/2017   Procedure: Atrial Fibrillation Ablation;  Surgeon: Thompson Grayer, MD;  Location: [facility];  Service: Cardiovascular;  Laterality: N/A;  . bilateral inguinal hernia repair  07/10/2012   lap BIH repairs  . CARDIOVERSION N/A 03/07/2017   Procedure: CARDIOVERSION;  Surgeon: Fay Records, MD;  Location: [facility] ENDOSCOPY;  Service: Cardiovascular;  Laterality: N/A;  . COLONOSCOPY    . HERNIA REPAIR  07/10/12   LIH/rih-umb  . KNEE SURGERY  2005   Duda- arthroscopy, partial medial menisectomy-lt  . PROSTATE SURGERY  12/19/06   robotic prostatectomy  . rotator cuff surgery  2002   Gioffre-rt  . SHOULDER ARTHROSCOPY WITH ROTATOR CUFF REPAIR AND SUBACROMIAL DECOMPRESSION Left 07/10/2013   Procedure: LEFT SHOULDER ARTHROSCOPY WITH ARTHROSCOPIC ROTATOR CUFF REPAIR AND SUBACROMIAL DECOMPRESSION, PARTIAL ACROMIOPLASTY WITH CORACROMIAL RELEASE;   Surgeon: Johnny Bridge, MD;  Location: [facility];  Service: Orthopedics;  Laterality: Left;    Social History   Socioeconomic History  . Marital status: Married    Spouse name: Not on file  . Number of children: Not on file  . Years of education: Not on file  . Highest education level: Not on file  Occupational History  . Occupation: retired Licensed conveyancer)    Employer: [employer]  Tobacco Use  . Smoking status: Former Smoker    Quit date: 09/25/1971    Years since quitting: 47.9  . Smokeless tobacco: Never Used  Substance and Sexual Activity  . Alcohol use: No  . Drug use: No  . Sexual activity: Not on file  Other Topics Concern  . Not on file  Social History Narrative   Regular exercise: yes - runner 5d/wk, former marathon runner   Diet: no red meat, good water, fruits/vegetables daily    Social Determinants of Health   Financial Resource Strain: Low Risk   . Difficulty of Paying Living Expenses: Not very hard  Food Insecurity: No Food Insecurity  . Worried About Charity fundraiser in the Last Year: Never true  . Ran Out of Food in the Last Year: Never true  Transportation Needs: No Transportation Needs  . Lack of Transportation (Medical): No  . Lack of Transportation (Non-Medical): No  Physical Activity: Sufficiently Active  . Days of Exercise per Week: 4 days  . Minutes of Exercise per Session: 50 min  Stress: No Stress Concern Present  . Feeling of Stress : Not at all  Social Connections:   . Frequency of Communication with Friends and Family: Not on file  . Frequency of Social Gatherings with Friends and Family: Not on file  . Attends Religious Services: Not on file  . Active Member of Clubs or Organizations: Not on file  . Attends Archivist Meetings: Not on file  . Marital Status: Not on file  Intimate Partner Violence: Not At Risk  . Fear of Current or Ex-Partner: No  . Emotionally Abused: No  . Physically Abused: No    . Sexually Abused: No    Family History  Problem Relation Age of Onset  . Breast cancer Mother   . Cancer Brother 69       brain tumor  . Heart disease Other        fam hx  . Arthritis Other        other relative  . Prostate cancer Other        nephew  . Lung cancer Other 22       nephew  . Arrhythmia Father     Allergies  Allergen Reactions  . Flecainide Other (See Comments)    Dizziness and fatigue    Medication list reviewed and updated in full in [place].   GEN: No acute illnesses, no fevers, chills. GI: No n/v/d, eating normally Pulm: No SOB Interactive and getting along well at home.  Otherwise, ROS is as per the HPI.  Objective:   BP 100/62   Pulse Marland Kitchen)  56   Temp (!) 97 F (36.1 C) (Temporal)   Ht 5' 8.5" (1.74 m)   Wt 173 lb (78.5 kg)   SpO2 97%   BMI 25.92 kg/m   GEN: WDWN, NAD, Non-toxic, A & O x 3 HEENT: Atraumatic, Normocephalic. Neck supple. No masses, No LAD. Ears and Nose: No external deformity. CV: RRR, No M/G/R. No JVD. No thrill. No extra heart sounds. PULM: CTA B, no wheezes, crackles, rhonchi. No retractions. No resp. distress. No accessory muscle use. EXTR: No c/c/e NEURO Normal gait.  PSYCH: Normally interactive. Conversant. Not depressed or anxious appearing.  Calm demeanor.   Laboratory and Imaging Data:  Assessment and Plan:     ICD-10-CM   1. Dietary counseling  Z71.3   2. Exercise counseling  Z71.82    >15 minutes spent in face to face time with patient, >50% spent in counselling or coordination of care  He is doing really well, and I have minimal to add and reported to him that he really needs to just keep eating well and exercising like he has been doing for a very long time  Follow-up: No follow-ups on file.  No orders of the defined types were placed in this encounter.  No orders of the defined types were placed in this encounter.   Signed,  Maud Deed. Zuriah Bordas, MD   No outpatient encounter  medications on file as of 09/09/2019.   No facility-administered encounter medications on file as of 09/09/2019.

## 2019-11-23 DIAGNOSIS — H04122 Dry eye syndrome of left lacrimal gland: Secondary | ICD-10-CM | POA: Diagnosis not present

## 2019-11-23 DIAGNOSIS — Z961 Presence of intraocular lens: Secondary | ICD-10-CM | POA: Diagnosis not present

## 2020-02-09 NOTE — Progress Notes (Signed)
PCP:  [name], MD Primary Cardiologist: No primary care provider on file. Electrophysiologist: Thompson Grayer, MD   Derek [name] is a 74 y.o. male seen today for Thompson Grayer, MD for routine electrophysiology followup.  Since last being seen in our clinic the patient reports doing very well apart from one episode.  Monday morning he woke up and felt lightheaded with standing. He took his pulse with pulse ox and had intermittent readings of 40-120 bpm. He changed the batteries and by that time it had normalized in the the 60-70 range. He had been working in the yard the weekend, and took a decongestant Sunday night, which he normally does not. He also notes dark yellow urine on Monday am. His lightheadedness resolved after hydration within 1-2 hrs. No further symptoms since. Otherwise, denies chest pain, palpitations, dyspnea, PND, orthopnea, nausea, vomiting, syncope, edema, weight gain, or early satiety.  Past Medical History:  Diagnosis Date  . Basal cell carcinoma 2009   back  . Cancer of prostate ([place])    pT2c No Mx, Gleason 3+4=7  . Chronic systolic dysfunction of left ventricle    EF 45% by echo 2/18  . Diverticulitis   . Hypogonadism male   . Kidney stones   . Left rotator cuff tear 07/10/2013  . Nephrolithiasis   . Paroxysmal atrial fibrillation (HCC)   . Rheumatic fever 1952-1953   Past Surgical History:  Procedure Laterality Date  . ABLATION OF DYSRHYTHMIC FOCUS  02/26/2017  . ATRIAL FIBRILLATION ABLATION N/A 02/26/2017   Procedure: Atrial Fibrillation Ablation;  Surgeon: Thompson Grayer, MD;  Location: [facility];  Service: Cardiovascular;  Laterality: N/A;  . bilateral inguinal hernia repair  07/10/2012   lap BIH repairs  . CARDIOVERSION N/A 03/07/2017   Procedure: CARDIOVERSION;  Surgeon: Fay Records, MD;  Location: [facility] ENDOSCOPY;  Service: Cardiovascular;  Laterality: N/A;  . COLONOSCOPY    . HERNIA REPAIR  07/10/12   LIH/rih-umb  . KNEE SURGERY  2005     Duda- arthroscopy, partial medial menisectomy-lt  . PROSTATE SURGERY  12/19/06   robotic prostatectomy  . rotator cuff surgery  2002   Gioffre-rt  . SHOULDER ARTHROSCOPY WITH ROTATOR CUFF REPAIR AND SUBACROMIAL DECOMPRESSION Left 07/10/2013   Procedure: LEFT SHOULDER ARTHROSCOPY WITH ARTHROSCOPIC ROTATOR CUFF REPAIR AND SUBACROMIAL DECOMPRESSION, PARTIAL ACROMIOPLASTY WITH CORACROMIAL RELEASE;  Surgeon: Johnny Bridge, MD;  Location: [facility];  Service: Orthopedics;  Laterality: Left;    No current outpatient medications on file.   No current facility-administered medications for this visit.    Allergies  Allergen Reactions  . Flecainide Other (See Comments)    Dizziness and fatigue    Social History   Socioeconomic History  . Marital status: Married    Spouse name: Not on file  . Number of children: Not on file  . Years of education: Not on file  . Highest education level: Not on file  Occupational History  . Occupation: retired Licensed conveyancer)    Employer: [employer]  Tobacco Use  . Smoking status: Former Smoker    Quit date: 09/25/1971    Years since quitting: 48.4  . Smokeless tobacco: Never Used  Substance and Sexual Activity  . Alcohol use: No  . Drug use: No  . Sexual activity: Not on file  Other Topics Concern  . Not on file  Social History Narrative   Regular exercise: yes - runner 5d/wk, former marathon runner   Diet: no red  meat, good water, fruits/vegetables daily    Social Determinants of Health   Financial Resource Strain: Low Risk   . Difficulty of Paying Living Expenses: Not very hard  Food Insecurity: No Food Insecurity  . Worried About Charity fundraiser in the Last Year: Never true  . Ran Out of Food in the Last Year: Never true  Transportation Needs: No Transportation Needs  . Lack of Transportation (Medical): No  . Lack of Transportation (Non-Medical): No  Physical Activity: Sufficiently Active  . Days of  Exercise per Week: 4 days  . Minutes of Exercise per Session: 50 min  Stress: No Stress Concern Present  . Feeling of Stress : Not at all  Social Connections:   . Frequency of Communication with Friends and Family:   . Frequency of Social Gatherings with Friends and Family:   . Attends Religious Services:   . Active Member of Clubs or Organizations:   . Attends Archivist Meetings:   Marland Kitchen Marital Status:   Intimate Partner Violence: Not At Risk  . Fear of Current or Ex-Partner: No  . Emotionally Abused: No  . Physically Abused: No  . Sexually Abused: No     Review of Systems: General: No chills, fever, night sweats or weight changes  Cardiovascular:  No chest pain, dyspnea on exertion, edema, orthopnea, palpitations, paroxysmal nocturnal dyspnea Dermatological: No rash, lesions or masses Respiratory: No cough, dyspnea Urologic: No hematuria, dysuria Abdominal: No nausea, vomiting, diarrhea, bright red blood per rectum, melena, or hematemesis Neurologic: No visual changes, weakness, changes in mental status All other systems reviewed and are otherwise negative except as noted above.  Physical Exam: Vitals:   02/10/20 1047  BP: 102/70  Pulse: (!) 57  SpO2: 98%  Weight: 176 lb 6.4 oz (80 kg)  Height: 5' 8.5" (1.74 m)    GEN- The patient is well appearing, alert and oriented x 3 today.   HEENT: normocephalic, atraumatic; sclera clear, conjunctiva pink; hearing intact; oropharynx clear; neck supple, no JVP Lymph- no cervical lymphadenopathy Lungs- Clear to ausculation bilaterally, normal work of breathing.  No wheezes, rales, rhonchi Heart- Regular rate and rhythm, no murmurs, rubs or gallops, PMI not laterally displaced GI- soft, non-tender, non-distended, bowel sounds present, no hepatosplenomegaly Extremities- no clubbing, cyanosis, or edema; DP/PT/radial pulses 2+ bilaterally MS- no significant deformity or atrophy Skin- warm and dry, no rash or lesion Psych-  euthymic mood, full affect Neuro- strength and sensation are intact  EKG is ordered. Personal review of EKG from today shows sinus bradycardia with normal interval   Additional studies reviewed include: Previous EP office notes, Echo 08/2017 50-55%  Assessment and Plan:  1. Paroxysmal atrial fibrillation Well controlled post ablation off AAD therapy CHA2DS2VASC of 1. He is therefore not on [place] as per 2019 AHA/ACC/HRS guidelines    2. Dizziness Singular episode, seems to be related to orthostasis with working out in the yard, a component of dehydration, and taking a decongestant.  Pt knows to monitor for further symptoms and call us back.   RTC annually. Sooner with issues/symptoms.    Shirley Friar, PA-C  02/10/20 11:21 AM

## 2020-02-10 ENCOUNTER — Encounter: Payer: Self-pay | Admitting: Student

## 2020-02-10 ENCOUNTER — Ambulatory Visit (INDEPENDENT_AMBULATORY_CARE_PROVIDER_SITE_OTHER): Payer: Medicare Other | Admitting: Student

## 2020-02-10 VITALS — BP 102/70 | HR 57 | Ht 68.5 in | Wt 176.4 lb

## 2020-02-10 DIAGNOSIS — R42 Dizziness and giddiness: Secondary | ICD-10-CM | POA: Diagnosis not present

## 2020-02-10 DIAGNOSIS — Z8679 Personal history of other diseases of the circulatory system: Secondary | ICD-10-CM

## 2020-02-10 NOTE — Patient Instructions (Signed)
Medication Instructions:  none *If you need a refill on your cardiac medications before your next appointment, please call your pharmacy*   Lab Work: none If you have labs (blood work) drawn today and your tests are completely normal, you will receive your results only by: Marland Kitchen MyChart Message (if you have MyChart) OR . A paper copy in the mail If you have any lab test that is abnormal or we need to change your treatment, we will call you to review the results.   Testing/Procedures: none   Follow-Up: At [facility], you and your health needs are our priority.  As part of our continuing mission to provide you with exceptional heart care, we have created designated Provider Care Teams.  These Care Teams include your primary Cardiologist (physician) and Advanced Practice Providers (APPs -  Physician Assistants and Nurse Practitioners) who all work together to provide you with the care you need, when you need it.  We recommend signing up for the patient portal called "MyChart".  Sign up information is provided on this After Visit Summary.  MyChart is used to connect with patients for Virtual Visits (Telemedicine).  Patients are able to view lab/test results, encounter notes, upcoming appointments, etc.  Non-urgent messages can be sent to your provider as well.   To learn more about what you can do with MyChart, go to [email or web address].    Your next appointment:   1 year(s)  The format for your next appointment:   Either In Person or Virtual  Provider:   Dr [name]   Other Instructions

## 2020-03-22 NOTE — Progress Notes (Signed)
Maury Bamba T. Dwanda Tufano, MD Primary Care and [place] at [facility] [place], [address] Phone: [phone]   FAX: [place] - 74 y.o. male   MRN [id number]   Date of Birth: [birth year]  Visit Date: 03/23/2020   PCP: [name], MD   Referred by: [name], MD Chief Complaint  Patient presents with   Ear Drainage   Dizziness   Nasal Congestion   Fatigue   Virtual Visit via Video Note:  I connected with  Derek [name] on 03/23/2020  9:40 AM EDT by a video enabled telemedicine application and verified that I am speaking with the correct person using two identifiers.   Location patient: home computer, tablet, or smartphone Location provider: work or home office Consent: Verbal consent directly obtained from Oralia Manis. Persons participating in the virtual visit: patient, provider  I discussed the limitations of evaluation and management by telemedicine and the availability of in person appointments. The patient expressed understanding and agreed to proceed.  Interactive audio and video telecommunications were attempted between this provider and patient, however failed, due to patient having technical difficulties OR patient did not have access to video capability.  We continued and completed visit with audio only.    History of Present Illness:  He presents with some sinus pressure and pain  Moreover, he does have some nasal drainage, dizziness.  He also has some generalized fatigue and ear discomfort.  He has had some ear wetness and itchiness. 6-8 weeks ear drainage.  Ear pain.  No energy.  Pulse is normal ? Check bp -he has not checked this. Heart in rhythm  Itchy ear canal -   Review of Systems as above: See pertinent positives and pertinent negatives per HPI No acute distress verbally   Observations/Objective/Exam:  An attempt was made to discern vital signs over the phone  and per patient if applicable and possible.   General:    Alert, Oriented, appears well and in no acute distress  Pulmonary:     On inspection no signs of respiratory distress.  Psych / Neurological:     Pleasant and cooperative.  Results for orders placed or performed in visit on 09/07/19  Lipid panel  Result Value Ref Range   Cholesterol 205 (H) 0 - 200 mg/dL   Triglycerides 83.0 0 - 149 mg/dL   HDL 78.20 >39.00 mg/dL   VLDL 16.6 0.0 - 40.0 mg/dL   LDL Cholesterol 110 (H) 0 - 99 mg/dL   Total CHOL/HDL Ratio 3    NonHDL 126.36   Hepatic function panel  Result Value Ref Range   Total Bilirubin 0.5 0.2 - 1.2 mg/dL   Bilirubin, Direct 0.1 0.0 - 0.3 mg/dL   Alkaline Phosphatase 76 39 - 117 U/L   AST 23 0 - 37 U/L   ALT 21 0 - 53 U/L   Total Protein 6.6 6.0 - 8.3 g/dL   Albumin 4.3 3.5 - 5.2 g/dL  Basic metabolic panel  Result Value Ref Range   Sodium 142 135 - 145 mEq/L   Potassium 4.4 3.5 - 5.1 mEq/L   Chloride 105 96 - 112 mEq/L   CO2 30 19 - 32 mEq/L   Glucose, Bld 82 70 - 99 mg/dL   BUN 23 6 - 23 mg/dL   Creatinine, Ser 1.13 0.40 - 1.50 mg/dL   GFR 63.16 >60.00 mL/min   Calcium 9.7 8.4 - 10.5 mg/dL  CBC with Differential/Platelet  Result Value Ref Range   WBC 7.9 4.0 - 10.5 K/uL   RBC 4.56 4.22 - 5.81 Mil/uL   Hemoglobin 14.5 13.0 - 17.0 g/dL   HCT 43.1 39 - 52 %   MCV 94.4 78.0 - 100.0 fl   MCHC 33.6 30.0 - 36.0 g/dL   RDW 13.7 11.5 - 15.5 %   Platelets 270.0 150 - 400 K/uL   Neutrophils Relative % 47.3 43 - 77 %   Lymphocytes Relative 41.8 12 - 46 %   Monocytes Relative 6.5 3 - 12 %   Eosinophils Relative 3.4 0 - 5 %   Basophils Relative 1.0 0 - 3 %   Neutro Abs 3.7 1.4 - 7.7 K/uL   Lymphs Abs 3.3 0.7 - 4.0 K/uL   Monocytes Absolute 0.5 0 - 1 K/uL   Eosinophils Absolute 0.3 0 - 0 K/uL   Basophils Absolute 0.1 0 - 0 K/uL  PSA, Total with Reflex to PSA, Free  Result Value Ref Range   PSA, Total 0.1 < OR = 4.0 ng/mL     Assessment and Plan:     ICD-10-CM   1. Acute diffuse otitis externa of right ear  H60.311   2. Nasal congestion  R09.81   3. Other fatigue  R53.83   4. Orthostasis  I95.1    Total encounter time: 20 minutes. On the day of the patient encounter, this can include review of prior records, labs, and imaging.  Additional time can include counselling, consultation with peer MD in person or by telephone.  This also includes independent review of Radiology.  Including record review.  Challenging to assess all these with a telephone visit.  Exam would be highly helpful.  Certainly may have an otitis externa, he also could have some significant allergies.  It does sound like he is having some orthostasis with standing.  Recommended that he get a blood pressure cuff.  Fatigue can be caused by 100s of things, and this is difficult to assess over the telephone.  Start with some basic over-the-counter antihistamines like Claritin or Zyrtec  I discussed the assessment and treatment plan with the patient. The patient was provided an opportunity to ask questions and all were answered. The patient agreed with the plan and demonstrated an understanding of the instructions.   The patient was advised to call back or seek an in-person evaluation if the symptoms worsen or if the condition fails to improve as anticipated.  Follow-up: prn unless noted otherwise below No follow-ups on file.  Meds ordered this encounter  Medications   neomycin-polymyxin-hydrocortisone (CORTISPORIN) OTIC solution    Sig: Place 3 drops into the right ear 4 (four) times daily for 7 days.    Dispense:  10 mL    Refill:  0   No orders of the defined types were placed in this encounter.   Signed,  Maud Deed. Darenda Fike, MD

## 2020-03-23 ENCOUNTER — Encounter: Payer: Self-pay | Admitting: Family Medicine

## 2020-03-23 ENCOUNTER — Telehealth: Payer: Self-pay | Admitting: Family Medicine

## 2020-03-23 ENCOUNTER — Telehealth (INDEPENDENT_AMBULATORY_CARE_PROVIDER_SITE_OTHER): Payer: Medicare Other | Admitting: Family Medicine

## 2020-03-23 VITALS — HR 63 | Ht 68.5 in | Wt 176.0 lb

## 2020-03-23 DIAGNOSIS — I951 Orthostatic hypotension: Secondary | ICD-10-CM

## 2020-03-23 DIAGNOSIS — H60311 Diffuse otitis externa, right ear: Secondary | ICD-10-CM

## 2020-03-23 DIAGNOSIS — R5383 Other fatigue: Secondary | ICD-10-CM

## 2020-03-23 DIAGNOSIS — R0981 Nasal congestion: Secondary | ICD-10-CM | POA: Diagnosis not present

## 2020-03-23 MED ORDER — NEOMYCIN-POLYMYXIN-HC 3.5-10000-1 OT SOLN: 3.0000 [drp] | Freq: Four times a day (QID) | OTIC | 0 refills | Status: AC

## 2020-03-23 NOTE — Telephone Encounter (Signed)
Patient called in regards to his ear drops he picked up from the pharmacy  He stated that the instruction say to place 3 drops in right ear. He stated that he is having trouble in both ears and was confused why it is just for 1 ear.   Wanted to know if its okay to place drops in both

## 2020-03-23 NOTE — Telephone Encounter (Signed)
Immunization record updated.

## 2020-03-23 NOTE — Telephone Encounter (Signed)
Mr. Brunty notified as instructed by telephone.  Patient states understanding.

## 2020-03-23 NOTE — Telephone Encounter (Signed)
I apologize.  I think the default on the script on the computer only puts 1 ear. He can totally use them with each ear.

## 2020-03-23 NOTE — Telephone Encounter (Signed)
Patient called to update his chart   Patient has the [place] vaccine done @ [facility], [place]   1st- 10/13/2019 2nd - 11/03/2019

## 2020-06-21 DIAGNOSIS — D2272 Melanocytic nevi of left lower limb, including hip: Secondary | ICD-10-CM | POA: Diagnosis not present

## 2020-06-21 DIAGNOSIS — D1801 Hemangioma of skin and subcutaneous tissue: Secondary | ICD-10-CM | POA: Diagnosis not present

## 2020-06-21 DIAGNOSIS — L720 Epidermal cyst: Secondary | ICD-10-CM | POA: Diagnosis not present

## 2020-06-21 DIAGNOSIS — Z85828 Personal history of other malignant neoplasm of skin: Secondary | ICD-10-CM | POA: Diagnosis not present

## 2020-06-21 DIAGNOSIS — D2271 Melanocytic nevi of right lower limb, including hip: Secondary | ICD-10-CM | POA: Diagnosis not present

## 2020-06-21 DIAGNOSIS — L821 Other seborrheic keratosis: Secondary | ICD-10-CM | POA: Diagnosis not present

## 2020-06-21 DIAGNOSIS — L570 Actinic keratosis: Secondary | ICD-10-CM | POA: Diagnosis not present

## 2020-06-30 DIAGNOSIS — Z23 Encounter for immunization: Secondary | ICD-10-CM | POA: Diagnosis not present

## 2020-07-04 ENCOUNTER — Telehealth: Payer: Self-pay

## 2020-07-04 NOTE — Telephone Encounter (Signed)
[facility] - Client TELEPHONE ADVICE RECORD [facility] Patient Name: Derek [name] Gender: Male DOB: [birth year] Age: 74 Y 29 M Return Phone Number: [phone] (Primary), [phone] (Secondary) [place] [address] Client [place] [facility] - Client Client Site [place] - Day Physician Copland, Frederico Hamman - MD Contact Type Call Who Is Calling Patient / Member / Family / Caregiver Call Type Triage / Clinical Relationship To Patient Self Return Phone Number [phone] (Primary) Chief Complaint Immunization Reaction Reason for Call Symptomatic / Request for Health Information Initial Comment Caller received his booster covid booster shot on thursday and started having dizziness and had vision changes that lasted about 4 to 5 seconds where it went dark. Caller said he had never had this happen before. Caller would like to get a referral for a CT scan as precautionary. Caller said he is now feeling fine this morning but he did start taking aspirin. Caller said he takes no other medications. Caller was transferred from the office. Translation No Nurse Assessment Nurse: Ysidro Evert, RN, Levada Dy Date/Time (Eastern Time): 07/04/2020 9:16:12 AM Confirm and document reason for call. If symptomatic, describe symptoms. ---Caller states he had his booster Covid shot last Thursday and he had symptoms of dizziness and he had about a 5 second loss of vision and then it cleared. He is not having any symptoms currently. Does the patient have any new or worsening symptoms? ---No Guidelines Guideline Title Affirmed Question Affirmed Notes Nurse Date/Time (Eastern Time) COVID-19 - Vaccine Questions and Reactions COVID-19 vaccine, systemic reactions (e.g., fatigue, fever, muscle aches), questions about Earleen Reaper 07/04/2020 9:19:02 AM Disp. Time Eilene Ghazi Time) Disposition Final  User 07/04/2020 9:22:01 AM Home Care Yes Ysidro Evert, RN, Marin Shutter Disagree/Comply Comply Caller Understands Yes PLEASE NOTE: All timestamps contained within this report are represented as [place] Standard Time. CONFIDENTIALTY NOTICE: This fax transmission is intended only for the addressee. It contains information that is legally privileged, confidential or otherwise protected from use or disclosure. If you are not the intended recipient, you are strictly prohibited from reviewing, disclosing, copying using or disseminating any of this information or taking any action in reliance on or regarding this information. If you have received this fax in error, please notify us immediately by telephone so that we can arrange for its return to [place]. Phone: [phone], Toll-Free: [phone], Fax: [phone] Page: 2 of 2 Call Id: [id number] PreDisposition Did not know what to do Care Advice Given Per Guideline HOME CARE: COVID-19 VACCINE - COMMON REACTIONS: * Local pain, redness, or swelling at injection site * Feeling tired (fatigue) * Fever and chills * Headache * Muscle aches or joint pains * Symptoms usually last 1 to 2 days. CARE ADVICE given per COVID-19 - Vaccine Questions and Reactions (Adult) guideline.

## 2020-07-04 NOTE — Telephone Encounter (Signed)
Per appt notes pt already has appt with DR Tower on 07/06/20 at 8 AM.

## 2020-07-04 NOTE — Telephone Encounter (Signed)
Aware Will cc to pcp

## 2020-07-06 ENCOUNTER — Ambulatory Visit (INDEPENDENT_AMBULATORY_CARE_PROVIDER_SITE_OTHER): Payer: Medicare Other | Admitting: Family Medicine

## 2020-07-06 ENCOUNTER — Other Ambulatory Visit: Payer: Self-pay

## 2020-07-06 ENCOUNTER — Encounter: Payer: Self-pay | Admitting: Family Medicine

## 2020-07-06 VITALS — BP 116/78 | HR 64 | Temp 96.9°F | Ht 68.5 in | Wt 176.1 lb

## 2020-07-06 DIAGNOSIS — R519 Headache, unspecified: Secondary | ICD-10-CM | POA: Diagnosis not present

## 2020-07-06 DIAGNOSIS — Z13220 Encounter for screening for lipoid disorders: Secondary | ICD-10-CM | POA: Diagnosis not present

## 2020-07-06 DIAGNOSIS — H539 Unspecified visual disturbance: Secondary | ICD-10-CM | POA: Diagnosis not present

## 2020-07-06 DIAGNOSIS — R55 Syncope and collapse: Secondary | ICD-10-CM | POA: Diagnosis not present

## 2020-07-06 DIAGNOSIS — M79672 Pain in left foot: Secondary | ICD-10-CM | POA: Diagnosis not present

## 2020-07-06 LAB — LIPID PANEL
Cholesterol: 207 mg/dL — ABNORMAL HIGH (ref 0–200)
HDL: 58.9 mg/dL (ref >39.00)
LDL Cholesterol: 129 mg/dL — ABNORMAL HIGH (ref 0–99)
NonHDL: 148.44
Total CHOL/HDL Ratio: 4
Triglycerides: 96 mg/dL (ref 0.0–149.0)
VLDL: 19.2 mg/dL (ref 0.0–40.0)

## 2020-07-06 LAB — BASIC METABOLIC PANEL
BUN: 17 mg/dL (ref 6–23)
CO2: 31 mEq/L (ref 19–32)
Calcium: 9.5 mg/dL (ref 8.4–10.5)
Chloride: 103 mEq/L (ref 96–112)
Creatinine, Ser: 1.15 mg/dL (ref 0.40–1.50)
GFR: 61.29 mL/min (ref >60.00)
Glucose, Bld: 93 mg/dL (ref 70–99)
Potassium: 4.3 mEq/L (ref 3.5–5.1)
Sodium: 140 mEq/L (ref 135–145)

## 2020-07-06 LAB — SEDIMENTATION RATE: Sed Rate: 3 mm/hr (ref 0–20)

## 2020-07-06 NOTE — Progress Notes (Signed)
Subjective:    Patient ID: Derek [name], male    DOB: [birth year], 74 y.o.   MRN: [id number]  This visit occurred during the SARS-CoV-2 public health emergency.  Safety protocols were in place, including screening questions prior to the visit, additional usage of staff PPE, and extensive cleaning of exam room while observing appropriate contact time as indicated for disinfecting solutions.    HPI 74 yo pt of Dr Lorelei Pont presents with dizziness/vision change since getting covid booster   Wt Readings from Last 3 Encounters:  07/06/20 176 lb 2 oz (79.9 kg)  03/23/20 176 lb (79.8 kg)  02/10/20 176 lb 6.4 oz (80 kg)   26.39 kg/m  covid vaccines were in January and February and 06/30/20  Not a lot of side effects to the first 2   3rd shot - about 17-18 minutes after he left he felt dizzy  Then he noticed vision change (dark) - that lasted 4-5 seconds and it cleared up  Felt like a curtain went over his eyes   24-36 hours - a little achey/tired and soreness in arm- that got better   Now has a dull and uncomfortable feeling over his forehead  This am moreso over the R eye  Neck is a little stiff   He continues to exercise  Walks for 3 or more miles at a good pace   He started taking low dose aspirin 2 d after the shot low dose bid    BP Readings from Last 3 Encounters:  07/06/20 116/78  02/10/20 102/70  09/09/19 100/62   Pulse Readings from Last 3 Encounters:  07/06/20 64  03/23/20 63  02/10/20 (!) 57   Has never had covid   He does not tend to get headaches in the past  Cholesterol Lab Results  Component Value Date   CHOL 205 (H) 09/07/2019   HDL 78.20 09/07/2019   LDLCALC 110 (H) 09/07/2019   LDLDIRECT 112.7 07/02/2011   TRIG 83.0 09/07/2019   CHOLHDL 3 09/07/2019   He has had a fib in the past - ablation stopped it   No dizziness after the first day   Patient Active Problem List   Diagnosis Date Noted   Pre-syncope 07/10/2020   Encounter for  screening for lipoid disorders 07/06/2020   Vision changes 07/06/2020   Headache 07/06/2020   Advanced directives, counseling/discussion 08/27/2018   History of atrial fibrillation 10/30/2016   Left rotator cuff tear 07/10/2013   Scrotal discoloration / purpling ?varicocele 09/08/2012   Bilateral inguinal hernia (BIH) s/p lap repair 07/06/2012 06/24/2012   PROSTATE CANCER, HX OF 11/10/2008   CARCINOMA, BASAL CELL, HX OF 11/10/2008   RHEUMATIC FEVER, HX OF 11/10/2008   DIVERTICULITIS, HX OF 11/10/2008   Past Medical History:  Diagnosis Date   Basal cell carcinoma 2009   back   Cancer of prostate ([place])    pT2c No Mx, Gleason 4+5=9   Chronic systolic dysfunction of left ventricle    EF 45% by echo 2/18   Diverticulitis    Hypogonadism male    Kidney stones    Left rotator cuff tear 07/10/2013   Nephrolithiasis    Paroxysmal atrial fibrillation ([place])    Rheumatic fever 1952-1953   Past Surgical History:  Procedure Laterality Date   ABLATION OF DYSRHYTHMIC FOCUS  02/26/2017   ATRIAL FIBRILLATION ABLATION N/A 02/26/2017   Procedure: Atrial Fibrillation Ablation;  Surgeon: Thompson Grayer, MD;  Location: [facility];  Service: Cardiovascular;  Laterality:  N/A;   bilateral inguinal hernia repair  07/10/2012   lap BIH repairs   CARDIOVERSION N/A 03/07/2017   Procedure: CARDIOVERSION;  Surgeon: Fay Records, MD;  Location: [facility] ENDOSCOPY;  Service: Cardiovascular;  Laterality: N/A;   COLONOSCOPY     HERNIA REPAIR  07/10/12   LIH/rih-umb   KNEE SURGERY  2005   Duda- arthroscopy, partial medial menisectomy-lt   PROSTATE SURGERY  12/19/06   robotic prostatectomy   rotator cuff surgery  2002   Gioffre-rt   SHOULDER ARTHROSCOPY WITH ROTATOR CUFF REPAIR AND SUBACROMIAL DECOMPRESSION Left 07/10/2013   Procedure: LEFT SHOULDER ARTHROSCOPY WITH ARTHROSCOPIC ROTATOR CUFF REPAIR AND SUBACROMIAL DECOMPRESSION, PARTIAL ACROMIOPLASTY WITH CORACROMIAL RELEASE;   Surgeon: Johnny Bridge, MD;  Location: [facility];  Service: Orthopedics;  Laterality: Left;   Social History   Tobacco Use   Smoking status: Former Smoker    Quit date: 09/25/1971    Years since quitting: 48.8   Smokeless tobacco: Never Used  Vaping Use   Vaping Use: Never used  Substance Use Topics   Alcohol use: No   Drug use: No   Family History  Problem Relation Age of Onset   Breast cancer Mother    Cancer Brother 39       brain tumor   Heart disease Other        fam hx   Arthritis Other        other relative   Prostate cancer Other        nephew   Lung cancer Other 77       nephew   Arrhythmia Father    Allergies  Allergen Reactions   Flecainide Other (See Comments)    Dizziness and fatigue   No current outpatient medications on file prior to visit.   No current facility-administered medications on file prior to visit.    Review of Systems  Constitutional: Negative for activity change, appetite change, fatigue, fever and unexpected weight change.  HENT: Negative for congestion, rhinorrhea, sore throat and trouble swallowing.   Eyes: Negative for photophobia, pain, discharge, redness, itching and visual disturbance.       Vision has returned to normal  Respiratory: Negative for cough, chest tightness, shortness of breath and wheezing.   Cardiovascular: Negative for chest pain and palpitations.  Gastrointestinal: Negative for abdominal pain, blood in stool, constipation, diarrhea and nausea.  Endocrine: Negative for cold intolerance, heat intolerance, polydipsia and polyuria.  Genitourinary: Negative for difficulty urinating, dysuria, frequency and urgency.  Musculoskeletal: Negative for arthralgias, joint swelling and myalgias.  Skin: Negative for pallor and rash.  Neurological: Negative for dizziness, tremors, facial asymmetry, weakness, numbness and headaches.       Dizziness is resolved Pressure sensation in forehead      Hematological: Negative for adenopathy. Does not bruise/bleed easily.  Psychiatric/Behavioral: Negative for decreased concentration and dysphoric mood. The patient is not nervous/anxious.        Objective:   Physical Exam Constitutional:      General: He is not in acute distress.    Appearance: Normal appearance. He is well-developed and normal weight. He is not ill-appearing or diaphoretic.  HENT:     Head: Normocephalic and atraumatic.     Right Ear: Tympanic membrane, ear canal and external ear normal.     Left Ear: Tympanic membrane, ear canal and external ear normal.     Nose: Nose normal.     Mouth/Throat:     Pharynx: No oropharyngeal exudate.  Eyes:     General: No visual field deficit or scleral icterus.       Right eye: No discharge.        Left eye: No discharge.     Extraocular Movements: Extraocular movements intact.     Conjunctiva/sclera: Conjunctivae normal.     Pupils: Pupils are equal, round, and reactive to light.     Comments: No nystagmus  Neck:     Thyroid: No thyromegaly.     Vascular: No carotid bruit or JVD.     Trachea: No tracheal deviation.  Cardiovascular:     Rate and Rhythm: Normal rate and regular rhythm.     Heart sounds: Normal heart sounds. No murmur heard.   Pulmonary:     Effort: Pulmonary effort is normal. No respiratory distress.     Breath sounds: Normal breath sounds. No wheezing or rales.  Abdominal:     General: Bowel sounds are normal. There is no distension.     Palpations: Abdomen is soft. There is no mass.     Tenderness: There is no abdominal tenderness.  Musculoskeletal:        General: No tenderness.     Cervical back: Full passive range of motion without pain, normal range of motion and neck supple.     Right lower leg: No edema.     Left lower leg: No edema.  Lymphadenopathy:     Cervical: No cervical adenopathy.  Skin:    General: Skin is warm and dry.     Coloration: Skin is not pale.     Findings: No erythema  or rash.  Neurological:     Mental Status: He is alert and oriented to person, place, and time.     Cranial Nerves: No cranial nerve deficit, dysarthria or facial asymmetry.     Sensory: No sensory deficit.     Motor: No weakness, tremor, atrophy, abnormal muscle tone, seizure activity or pronator drift.     Coordination: Coordination is intact. Romberg sign negative. Coordination normal. Finger-Nose-Finger Test normal.     Gait: Gait is intact. Gait normal.     Deep Tendon Reflexes: Reflexes are normal and symmetric. Reflexes normal.     Comments: No focal cerebellar signs   Psychiatric:        Behavior: Behavior normal.        Thought Content: Thought content normal.           Assessment & Plan:   Problem List Items Addressed This Visit      Cardiovascular and Mediastinum   Pre-syncope - Primary    This occurred after his covid vaccine with dizziness and a brief vision change  Now has dull pressure in head/frontal  Nl exam entirely  Did ref for eye evaluation  inst to call/ seek care if symptoms occur and to stay well hydrated          Other   Encounter for screening for lipoid disorders    Lipids done with labs Good HDL in the past      Relevant Orders   Lipid panel (Completed)   Vision changes    This occurred shortly after his last covid vaccine  In the context of what I suspect to be pre syncope/vaso vagal  Vision went dark for seconds-now back to normal Denies palpitations or sympotms of a fib Now has a dull pressure in head  Ref to ophthalmology Nl exam and neuro exam today  inst to call if symptoms return  or change         Relevant Orders   Ambulatory referral to Ophthalmology   Headache    Pt notes uncomfortable feeling over forehead esp on the R  (perhaps pressure rather than pain)  Since there is discomfort around eye/temple - labs done with ESR  (thought temporal arteritis is not high in the differential)  Neck is also sore/stiff  No sinus  symptoms  Nl neuro exam-reassuring        Relevant Orders   Sedimentation Rate (Completed)   Basic metabolic panel (Completed)

## 2020-07-06 NOTE — Patient Instructions (Signed)
I placed a referral to your eye doctor  Our office will call you   If symptoms worsen /if loose vision again call asap or go to ER for urgent evaluation   Drink lots of fluids   Labs today

## 2020-07-07 ENCOUNTER — Telehealth: Payer: Self-pay | Admitting: *Deleted

## 2020-07-07 DIAGNOSIS — H5310 Unspecified subjective visual disturbances: Secondary | ICD-10-CM | POA: Diagnosis not present

## 2020-07-07 NOTE — Telephone Encounter (Signed)
Addressed through result notes  

## 2020-07-07 NOTE — Telephone Encounter (Signed)
Left VM requesting pt to call the office back regarding lab results  

## 2020-07-10 DIAGNOSIS — R55 Syncope and collapse: Secondary | ICD-10-CM | POA: Insufficient documentation

## 2020-07-10 NOTE — Assessment & Plan Note (Signed)
This occurred after his covid vaccine with dizziness and a brief vision change  Now has dull pressure in head/frontal  Nl exam entirely  Did ref for eye evaluation  inst to call/ seek care if symptoms occur and to stay well hydrated

## 2020-07-10 NOTE — Assessment & Plan Note (Addendum)
Pt notes uncomfortable feeling over forehead esp on the R  (perhaps pressure rather than pain)  Since there is discomfort around eye/temple - labs done with ESR  (thought temporal arteritis is not high in the differential)  Neck is also sore/stiff  No sinus symptoms  Nl neuro exam-reassuring

## 2020-07-10 NOTE — Assessment & Plan Note (Signed)
Lipids done with labs Good HDL in the past

## 2020-07-10 NOTE — Assessment & Plan Note (Signed)
This occurred shortly after his last covid vaccine  In the context of what I suspect to be pre syncope/vaso vagal  Vision went dark for seconds-now back to normal Denies palpitations or sympotms of a fib Now has a dull pressure in head  Ref to ophthalmology Nl exam and neuro exam today  inst to call if symptoms return or change

## 2020-08-22 DIAGNOSIS — Z23 Encounter for immunization: Secondary | ICD-10-CM | POA: Diagnosis not present

## 2020-08-29 DIAGNOSIS — M79672 Pain in left foot: Secondary | ICD-10-CM | POA: Diagnosis not present

## 2020-09-12 ENCOUNTER — Other Ambulatory Visit: Payer: Medicare Other

## 2020-09-12 ENCOUNTER — Ambulatory Visit: Payer: Medicare Other

## 2020-09-14 ENCOUNTER — Ambulatory Visit: Payer: Medicare Other | Admitting: Family Medicine

## 2020-11-24 DIAGNOSIS — H524 Presbyopia: Secondary | ICD-10-CM | POA: Diagnosis not present

## 2020-11-24 DIAGNOSIS — Z961 Presence of intraocular lens: Secondary | ICD-10-CM | POA: Diagnosis not present

## 2020-11-24 DIAGNOSIS — H04122 Dry eye syndrome of left lacrimal gland: Secondary | ICD-10-CM | POA: Diagnosis not present

## 2020-12-05 ENCOUNTER — Other Ambulatory Visit: Payer: Self-pay | Admitting: Family Medicine

## 2020-12-05 DIAGNOSIS — Z8546 Personal history of malignant neoplasm of prostate: Secondary | ICD-10-CM

## 2020-12-05 DIAGNOSIS — Z79899 Other long term (current) drug therapy: Secondary | ICD-10-CM

## 2020-12-05 DIAGNOSIS — E785 Hyperlipidemia, unspecified: Secondary | ICD-10-CM

## 2020-12-06 ENCOUNTER — Other Ambulatory Visit: Payer: Self-pay

## 2020-12-06 ENCOUNTER — Other Ambulatory Visit (INDEPENDENT_AMBULATORY_CARE_PROVIDER_SITE_OTHER): Payer: Medicare Other

## 2020-12-06 ENCOUNTER — Ambulatory Visit (INDEPENDENT_AMBULATORY_CARE_PROVIDER_SITE_OTHER): Payer: Medicare Other

## 2020-12-06 DIAGNOSIS — Z8546 Personal history of malignant neoplasm of prostate: Secondary | ICD-10-CM | POA: Diagnosis not present

## 2020-12-06 DIAGNOSIS — Z79899 Other long term (current) drug therapy: Secondary | ICD-10-CM | POA: Diagnosis not present

## 2020-12-06 DIAGNOSIS — Z0000 Encounter for general adult medical examination without abnormal findings: Secondary | ICD-10-CM

## 2020-12-06 DIAGNOSIS — E785 Hyperlipidemia, unspecified: Secondary | ICD-10-CM | POA: Diagnosis not present

## 2020-12-06 LAB — BASIC METABOLIC PANEL
BUN: 15 mg/dL (ref 6–23)
CO2: 30 mEq/L (ref 19–32)
Calcium: 9.4 mg/dL (ref 8.4–10.5)
Chloride: 104 mEq/L (ref 96–112)
Creatinine, Ser: 1.14 mg/dL (ref 0.40–1.50)
GFR: 62.32 mL/min (ref >60.00)
Glucose, Bld: 84 mg/dL (ref 70–99)
Potassium: 4.5 mEq/L (ref 3.5–5.1)
Sodium: 138 mEq/L (ref 135–145)

## 2020-12-06 LAB — CBC WITH DIFFERENTIAL/PLATELET
Basophils Absolute: 0.1 10*3/uL (ref 0.0–0.1)
Basophils Relative: 1.1 % (ref 0.0–3.0)
Eosinophils Absolute: 0.2 10*3/uL (ref 0.0–0.7)
Eosinophils Relative: 3 % (ref 0.0–5.0)
HCT: 41.4 % (ref 39.0–52.0)
Hemoglobin: 14.1 g/dL (ref 13.0–17.0)
Lymphocytes Relative: 28.6 % (ref 12.0–46.0)
Lymphs Abs: 1.6 10*3/uL (ref 0.7–4.0)
MCHC: 34 g/dL (ref 30.0–36.0)
MCV: 93.6 fl (ref 78.0–100.0)
Monocytes Absolute: 0.5 10*3/uL (ref 0.1–1.0)
Monocytes Relative: 8.4 % (ref 3.0–12.0)
Neutro Abs: 3.3 10*3/uL (ref 1.4–7.7)
Neutrophils Relative %: 58.9 % (ref 43.0–77.0)
Platelets: 236 10*3/uL (ref 150.0–400.0)
RBC: 4.43 Mil/uL (ref 4.22–5.81)
RDW: 13.5 % (ref 11.5–15.5)
WBC: 5.5 10*3/uL (ref 4.0–10.5)

## 2020-12-06 LAB — LIPID PANEL
Cholesterol: 186 mg/dL (ref 0–200)
HDL: 61.6 mg/dL (ref >39.00)
LDL Cholesterol: 114 mg/dL — ABNORMAL HIGH (ref 0–99)
NonHDL: 124.1
Total CHOL/HDL Ratio: 3
Triglycerides: 50 mg/dL (ref 0.0–149.0)
VLDL: 10 mg/dL (ref 0.0–40.0)

## 2020-12-06 LAB — HEPATIC FUNCTION PANEL
ALT: 16 U/L (ref 0–53)
AST: 28 U/L (ref 0–37)
Albumin: 4.1 g/dL (ref 3.5–5.2)
Alkaline Phosphatase: 72 U/L (ref 39–117)
Bilirubin, Direct: 0.1 mg/dL (ref 0.0–0.3)
Total Bilirubin: 0.7 mg/dL (ref 0.2–1.2)
Total Protein: 6.7 g/dL (ref 6.0–8.3)

## 2020-12-06 NOTE — Progress Notes (Signed)
PCP notes:  Health Maintenance: No gaps noted   Abnormal Screenings: none   Patient concerns: none   Nurse concerns: none   Next PCP appt.: 12/12/2020 @ 10:20 am

## 2020-12-06 NOTE — Patient Instructions (Signed)
Mr. Derek [name] , Thank you for taking time to come for your Medicare Wellness Visit. I appreciate your ongoing commitment to your health goals. Please review the following plan we discussed and let me know if I can assist you in the future.   Screening recommendations/referrals: Colonoscopy: Up to date, completed 07/01/2018, due 06/2023 Recommended yearly ophthalmology/optometry visit for glaucoma screening and checkup Recommended yearly dental visit for hygiene and checkup  Vaccinations: Influenza vaccine: Up to date, completed 08/22/2020, due 04/2021 Pneumococcal vaccine: Completed series Tdap vaccine: Up to date, completed 12/22/2014, due 11/2024 Shingles vaccine: Completed series   Covid-19: Completed series  Advanced directives: Please bring a copy of your POA (Power of Attorney) and/or Living Will to your next appointment.   Conditions/risks identified: none  Next appointment: Follow up in one year for your annual wellness visit.   Preventive Care 77 Years and Older, Male Preventive care refers to lifestyle choices and visits with your health care provider that can promote health and wellness. What does preventive care include?  A yearly physical exam. This is also called an annual well check.  Dental exams once or twice a year.  Routine eye exams. Ask your health care provider how often you should have your eyes checked.  Personal lifestyle choices, including:  Daily care of your teeth and gums.  Regular physical activity.  Eating a healthy diet.  Avoiding tobacco and drug use.  Limiting alcohol use.  Practicing safe sex.  Taking low doses of aspirin every day.  Taking vitamin and mineral supplements as recommended by your health care provider. What happens during an annual well check? The services and screenings done by your health care provider during your annual well check will depend on your age, overall health, lifestyle risk factors, and family history of  disease. Counseling  Your health care provider may ask you questions about your:  Alcohol use.  Tobacco use.  Drug use.  Emotional well-being.  Home and relationship well-being.  Sexual activity.  Eating habits.  History of falls.  Memory and ability to understand (cognition).  Work and work Statistician. Screening  You may have the following tests or measurements:  Height, weight, and BMI.  Blood pressure.  Lipid and cholesterol levels. These may be checked every 5 years, or more frequently if you are over 72 years old.  Skin check.  Lung cancer screening. You may have this screening every year starting at age 62 if you have a 30-pack-year history of smoking and currently smoke or have quit within the past 15 years.  Fecal occult blood test (FOBT) of the stool. You may have this test every year starting at age 24.  Flexible sigmoidoscopy or colonoscopy. You may have a sigmoidoscopy every 5 years or a colonoscopy every 10 years starting at age 50.  Prostate cancer screening. Recommendations will vary depending on your family history and other risks.  Hepatitis C blood test.  Hepatitis B blood test.  Sexually transmitted disease (STD) testing.  Diabetes screening. This is done by checking your blood sugar (glucose) after you have not eaten for a while (fasting). You may have this done every 1-3 years.  Abdominal aortic aneurysm (AAA) screening. You may need this if you are a current or former smoker.  Osteoporosis. You may be screened starting at age 39 if you are at high risk. Talk with your health care provider about your test results, treatment options, and if necessary, the need for more tests. Vaccines  Your health care provider  may recommend certain vaccines, such as:  Influenza vaccine. This is recommended every year.  Tetanus, diphtheria, and acellular pertussis (Tdap, Td) vaccine. You may need a Td booster every 10 years.  Zoster vaccine. You may  need this after age 39.  Pneumococcal 13-valent conjugate (PCV13) vaccine. One dose is recommended after age 56.  Pneumococcal polysaccharide (PPSV23) vaccine. One dose is recommended after age 45. Talk to your health care provider about which screenings and vaccines you need and how often you need them. This information is not intended to replace advice given to you by your health care provider. Make sure you discuss any questions you have with your health care provider. Document Released: 10/07/2015 Document Revised: 05/30/2016 Document Reviewed: 07/12/2015 Elsevier Interactive Patient Education  2017 [place] Prevention in the Home Falls can cause injuries. They can happen to people of all ages. There are many things you can do to make your home safe and to help prevent falls. What can I do on the outside of my home?  Regularly fix the edges of walkways and driveways and fix any cracks.  Remove anything that might make you trip as you walk through a door, such as a raised step or threshold.  Trim any bushes or trees on the path to your home.  Use bright outdoor lighting.  Clear any walking paths of anything that might make someone trip, such as rocks or tools.  Regularly check to see if handrails are loose or broken. Make sure that both sides of any steps have handrails.  Any raised decks and porches should have guardrails on the edges.  Have any leaves, snow, or ice cleared regularly.  Use sand or salt on walking paths during winter.  Clean up any spills in your garage right away. This includes oil or grease spills. What can I do in the bathroom?  Use night lights.  Install grab bars by the toilet and in the tub and shower. Do not use towel bars as grab bars.  Use non-skid mats or decals in the tub or shower.  If you need to sit down in the shower, use a plastic, non-slip stool.  Keep the floor dry. Clean up any water that spills on the floor as soon as it  happens.  Remove soap buildup in the tub or shower regularly.  Attach bath mats securely with double-sided non-slip rug tape.  Do not have throw rugs and other things on the floor that can make you trip. What can I do in the bedroom?  Use night lights.  Make sure that you have a light by your bed that is easy to reach.  Do not use any sheets or blankets that are too big for your bed. They should not hang down onto the floor.  Have a firm chair that has side arms. You can use this for support while you get dressed.  Do not have throw rugs and other things on the floor that can make you trip. What can I do in the kitchen?  Clean up any spills right away.  Avoid walking on wet floors.  Keep items that you use a lot in easy-to-reach places.  If you need to reach something above you, use a strong step stool that has a grab bar.  Keep electrical cords out of the way.  Do not use floor polish or wax that makes floors slippery. If you must use wax, use non-skid floor wax.  Do not have throw rugs  and other things on the floor that can make you trip. What can I do with my stairs?  Do not leave any items on the stairs.  Make sure that there are handrails on both sides of the stairs and use them. Fix handrails that are broken or loose. Make sure that handrails are as long as the stairways.  Check any carpeting to make sure that it is firmly attached to the stairs. Fix any carpet that is loose or worn.  Avoid having throw rugs at the top or bottom of the stairs. If you do have throw rugs, attach them to the floor with carpet tape.  Make sure that you have a light switch at the top of the stairs and the bottom of the stairs. If you do not have them, ask someone to add them for you. What else can I do to help prevent falls?  Wear shoes that:  Do not have high heels.  Have rubber bottoms.  Are comfortable and fit you well.  Are closed at the toe. Do not wear sandals.  If you  use a stepladder:  Make sure that it is fully opened. Do not climb a closed stepladder.  Make sure that both sides of the stepladder are locked into place.  Ask someone to hold it for you, if possible.  Clearly mark and make sure that you can see:  Any grab bars or handrails.  First and last steps.  Where the edge of each step is.  Use tools that help you move around (mobility aids) if they are needed. These include:  Canes.  Walkers.  Scooters.  Crutches.  Turn on the lights when you go into a dark area. Replace any light bulbs as soon as they burn out.  Set up your furniture so you have a clear path. Avoid moving your furniture around.  If any of your floors are uneven, fix them.  If there are any pets around you, be aware of where they are.  Review your medicines with your doctor. Some medicines can make you feel dizzy. This can increase your chance of falling. Ask your doctor what other things that you can do to help prevent falls. This information is not intended to replace advice given to you by your health care provider. Make sure you discuss any questions you have with your health care provider. Document Released: 07/07/2009 Document Revised: 02/16/2016 Document Reviewed: 10/15/2014 Elsevier Interactive Patient Education  2017 [employer].

## 2020-12-06 NOTE — Progress Notes (Signed)
Subjective:   Derek [name] is a 75 y.o. male who presents for Medicare Annual/Subsequent preventive examination.  Review of Systems: N/A      I connected with the patient today by telephone and verified that I am speaking with the correct person using two identifiers. Location patient: home Location nurse: work Persons participating in the telephone visit: patient, nurse.   I discussed the limitations, risks, security and privacy concerns of performing an evaluation and management service by telephone and the availability of in person appointments. I also discussed with the patient that there may be a patient responsible charge related to this service. The patient expressed understanding and verbally consented to this telephonic visit.        Cardiac Risk Factors include: advanced age (>62men, >41 women);male gender     Objective:    Today's Vitals   There is no height or weight on file to calculate BMI.  Advanced Directives 12/06/2020 09/07/2019 08/27/2018 07/05/2017 03/07/2017 02/26/2017 07/07/2013  Does Patient Have a Medical Advance Directive? Yes Yes Yes Yes Yes Yes Patient has advance directive, copy not in chart  Type of Advance Directive [place];Living will [place];Living will [place];Living will [place];Living will Living will;Healthcare Power of [place];Living will  Does patient want to make changes to medical advance directive? - - - - - No - Patient declined -  Copy of [place] in Chart? No - copy requested No - copy requested Yes - validated most recent copy scanned in chart (See row information) No - copy requested No - copy requested No - copy requested -    Current Medications (verified) No outpatient encounter medications on file as of 12/06/2020.   No facility-administered encounter medications on file as  of 12/06/2020.    Allergies (verified) Flecainide   History: Past Medical History:  Diagnosis Date  . Basal cell carcinoma 2009   back  . Cancer of prostate ([place])    pT2c No Mx, Gleason 3+4=7  . Chronic systolic dysfunction of left ventricle    EF 45% by echo 2/18  . Diverticulitis   . Hypogonadism male   . Kidney stones   . Left rotator cuff tear 07/10/2013  . Nephrolithiasis   . Paroxysmal atrial fibrillation (HCC)   . Rheumatic fever 1952-1953   Past Surgical History:  Procedure Laterality Date  . ABLATION OF DYSRHYTHMIC FOCUS  02/26/2017  . ATRIAL FIBRILLATION ABLATION N/A 02/26/2017   Procedure: Atrial Fibrillation Ablation;  Surgeon: Thompson Grayer, MD;  Location: [facility];  Service: Cardiovascular;  Laterality: N/A;  . bilateral inguinal hernia repair  07/10/2012   lap BIH repairs  . CARDIOVERSION N/A 03/07/2017   Procedure: CARDIOVERSION;  Surgeon: Fay Records, MD;  Location: [facility];  Service: Cardiovascular;  Laterality: N/A;  . COLONOSCOPY    . HERNIA REPAIR  07/10/12   LIH/rih-umb  . KNEE SURGERY  2005   Duda- arthroscopy, partial medial menisectomy-lt  . PROSTATE SURGERY  12/19/06   robotic prostatectomy  . rotator cuff surgery  2002   Gioffre-rt  . SHOULDER ARTHROSCOPY WITH ROTATOR CUFF REPAIR AND SUBACROMIAL DECOMPRESSION Left 07/10/2013   Procedure: LEFT SHOULDER ARTHROSCOPY WITH ARTHROSCOPIC ROTATOR CUFF REPAIR AND SUBACROMIAL DECOMPRESSION, PARTIAL ACROMIOPLASTY WITH CORACROMIAL RELEASE;  Surgeon: Johnny Bridge, MD;  Location: [place] [facility];  Service: Orthopedics;  Laterality: Left;   Family History  Problem  Relation Age of Onset  . Breast cancer Mother   . Cancer Brother 51       brain tumor  . Heart disease Other        fam hx  . Arthritis Other        other relative  . Prostate cancer Other        nephew  . Lung cancer Other 31       nephew  . Arrhythmia Father    Social History   Socioeconomic History  .  Marital status: Married    Spouse name: Not on file  . Number of children: Not on file  . Years of education: Not on file  . Highest education level: Not on file  Occupational History  . Occupation: retired Licensed conveyancer)    Employer: [employer]  Tobacco Use  . Smoking status: Former Smoker    Quit date: 09/25/1971    Years since quitting: 49.2  . Smokeless tobacco: Never Used  Vaping Use  . Vaping Use: Never used  Substance and Sexual Activity  . Alcohol use: No  . Drug use: No  . Sexual activity: Not on file  Other Topics Concern  . Not on file  Social History Narrative   Regular exercise: yes - runner 5d/wk, former marathon runner   Diet: no red meat, good water, fruits/vegetables daily    Social Determinants of Health   Financial Resource Strain: Not on file  Food Insecurity: Not on file  Transportation Needs: Not on file  Physical Activity: Not on file  Stress: Not on file  Social Connections: Not on file    Tobacco Counseling Counseling given: Not Answered   Clinical Intake:  Pre-visit preparation completed: Yes  Pain : No/denies pain     Nutritional Risks: None Diabetes: No  How often do you need to have someone help you when you read instructions, pamphlets, or other written materials from your doctor or pharmacy?: 1 - Never What is the last grade level you completed in school?: 3 years of college  Diabetic: N/A Nutrition Risk Assessment:  Has the patient had any N/V/D within the last 2 months?  No  Does the patient have any non-healing wounds?  No  Has the patient had any unintentional weight loss or weight gain?  No   Diabetes:  Is the patient diabetic?  No  If diabetic, was a CBG obtained today?  N/A Did the patient bring in their glucometer from home?  N/A How often do you monitor your CBG's? N/A.   Financial Strains and Diabetes Management:  Are you having any financial strains with the device, your supplies or your  medication? N/A.  Does the patient want to be seen by Chronic Care Management for management of their diabetes?  N/A Would the patient like to be referred to a Nutritionist or for Diabetic Management?  N/A   Interpreter Needed?: No  Information entered by :: CJohnson, LPN   Activities of Daily Living In your present state of health, do you have any difficulty performing the following activities: 12/06/2020  Hearing? N  Vision? N  Difficulty concentrating or making decisions? N  Walking or climbing stairs? N  Dressing or bathing? N  Doing errands, shopping? N  Preparing Food and eating ? N  Using the Toilet? N  In the past six months, have you accidently leaked urine? N  Do you have problems with loss of bowel control? N  Managing your Medications? N  Managing your Finances? N  Housekeeping or managing your Housekeeping? N  Some recent data might be hidden    Patient Care Team: [name], MD as PCP - Bethena Roys, MD as PCP - Electrophysiology (Cardiology) Raynelle Bring, MD as Consulting Physician (Urology) Danella Sensing, MD as Consulting Physician (Dermatology) Rutherford Guys, MD as Consulting Physician (Ophthalmology) Thompson Grayer, MD as Consulting Physician (Cardiology) Javier Docker. Dalbert Mayotte., DMD as Referring Physician (Dentistry)  Indicate any recent Medical Services you may have received from other than [facility] providers in the past year (date may be approximate).     Assessment:   This is a routine wellness examination for Star[place].  Hearing/Vision screen  Hearing Screening   125Hz  250Hz  500Hz  1000Hz  2000Hz  3000Hz  4000Hz  6000Hz  8000Hz   Right ear:           Left ear:           Vision Screening Comments: Patient gets annual eye exams   Dietary issues and exercise activities discussed: Current Exercise Habits: Home exercise routine, Type of exercise: walking, Time (Minutes): 60, Frequency (Times/Week): 3, Weekly Exercise (Minutes/Week): 180, Intensity:  Moderate, Exercise limited by: None identified  Goals    . Increase physical activity     Starting 08/27/2018, I will continue to exercise for at least 70 minutes 4-5 days per week.    . Patient Stated     09/07/2019, I will maintain and continue exercising.     . Patient Stated     12/06/2020, I will continue to walk 3 days a week for 3 miles.       Depression Screen PHQ 2/9 Scores 12/06/2020 09/07/2019 08/27/2018 07/05/2017 07/02/2016 06/20/2015 06/16/2014  PHQ - 2 Score 0 0 0 0 0 0 0  PHQ- 9 Score 0 0 0 0 - - -    Fall Risk Fall Risk  12/06/2020 09/07/2019 08/27/2018 07/05/2017 07/02/2016  Falls in the past year? 0 0 0 No No  Number falls in past yr: 0 0 - - -  Injury with Fall? 0 0 - - -  Risk for fall due to : No Fall Risks Medication side effect - - -  Follow up Falls evaluation completed;Falls prevention discussed Falls evaluation completed;Falls prevention discussed - - -    FALL RISK PREVENTION PERTAINING TO THE HOME:  Any stairs in or around the home? Yes  If so, are there any without handrails? No  Home free of loose throw rugs in walkways, pet beds, electrical cords, etc? Yes  Adequate lighting in your home to reduce risk of falls? Yes   ASSISTIVE DEVICES UTILIZED TO PREVENT FALLS:  Life alert? No  Use of a cane, walker or w/c? No  Grab bars in the bathroom? No  Shower chair or bench in shower? No  Elevated toilet seat or a handicapped toilet? No   TIMED UP AND GO:  Was the test performed? N/A telephone visit .   Cognitive Function: MMSE - Mini Mental State Exam 12/06/2020 09/07/2019 08/27/2018 07/05/2017  Not completed: Refused - - -  Orientation to time - 5 5 5   Orientation to Place - 5 5 5   Registration - 3 3 3   Attention/ Calculation - 5 0 0  Recall - 3 3 3   Language- name 2 objects - - 0 0  Language- repeat - 1 1 1   Language- follow 3 step command - - 3 3  Language- read & follow direction - - 0 0  Write a sentence - -  0 0  Copy design - - 0 0  Total  score - - 20 20  Mini Cog  Mini-Cog screen was not completed. Patient did not feel the need to do this. Maximum score is 22. A value of 0 denotes this part of the MMSE was not completed or the patient failed this part of the Mini-Cog screening.       Immunizations Immunization History  Administered Date(s) Administered  . Fluad Quad(high Dose 65+) 07/07/2019  . Influenza,inj,Quad PF,6+ Mos 06/25/2013, 06/16/2014, 07/21/2015, 07/02/2016, 07/22/2017, 08/27/2018  . Influenza-Unspecified 08/22/2020  . PFIZER(Purple Top)SARS-COV-2 Vaccination 10/13/2019, 11/03/2019, 06/30/2020  . Pneumococcal Conjugate-13 05/03/2015  . Pneumococcal Polysaccharide-23 08/14/2006, 06/25/2013  . Tdap 12/22/2014  . Zoster 08/14/2006  . Zoster Recombinat (Shingrix) 07/08/2018, 09/09/2018    TDAP status: Up to date  Flu Vaccine status: Up to date  Pneumococcal vaccine status: Up to date  Covid-19 vaccine status: Completed vaccines  Qualifies for Shingles Vaccine? Yes   Zostavax completed Yes   Shingrix Completed?: Yes  Screening Tests Health Maintenance  Topic Date Due  . COLONOSCOPY (Pts 45-37yrs Insurance coverage will need to be confirmed)  07/02/2023  . TETANUS/TDAP  12/21/2024  . INFLUENZA VACCINE  Completed  . COVID-19 Vaccine  Completed  . Hepatitis C Screening  Completed  . PNA vac Low Risk Adult  Completed  . HPV VACCINES  Aged Out    Health Maintenance  There are no preventive care reminders to display for this patient.  Colorectal cancer screening: Type of screening: Colonoscopy. Completed 07/01/2018. Repeat every 5 years  Lung Cancer Screening: (Low Dose CT Chest recommended if Age 82-80 years, 30 pack-year currently smoking OR have quit w/in 15years.) does not qualify.   Additional Screening:  Hepatitis C Screening: does qualify; Completed 06/25/2016  Vision Screening: Recommended annual ophthalmology exams for early detection of glaucoma and other disorders of the eye. Is  the patient up to date with their annual eye exam?  Yes  Who is the provider or what is the name of the office in which the patient attends annual eye exams? Dr. Prudencio Burly If pt is not established with a provider, would they like to be referred to a provider to establish care? No .   Dental Screening: Recommended annual dental exams for proper oral hygiene  Community Resource Referral / Chronic Care Management: CRR required this visit?  No   CCM required this visit?  No      Plan:     I have personally reviewed and noted the following in the patient's chart:   . Medical and social history . Use of alcohol, tobacco or illicit drugs  . Current medications and supplements . Functional ability and status . Nutritional status . Physical activity . Advanced directives . List of other physicians . Hospitalizations, surgeries, and ER visits in previous 12 months . Vitals . Screenings to include cognitive, depression, and falls . Referrals and appointments  In addition, I have reviewed and discussed with patient certain preventive protocols, quality metrics, and best practice recommendations. A written personalized care plan for preventive services as well as general preventive health recommendations were provided to patient.   Due to this being a telephonic visit, the after visit summary with patients personalized plan was offered to patient via office or my-chart. Patient preferred to pick up at office at next visit or via mychart.   Andrez Grime, LPN   [id number]

## 2020-12-07 LAB — PSA, TOTAL WITH REFLEX TO PSA, FREE: PSA, Total: <0.1 ng/mL (ref <=4.0)

## 2020-12-09 ENCOUNTER — Other Ambulatory Visit: Payer: Self-pay

## 2020-12-10 NOTE — Progress Notes (Signed)
Derek Shuping T. Derek Bonsall, MD, [name], [address]  Phone: [phone]  FAX: [name] - 75 y.o. male  MRN [id number]  Date of Birth: Jul 29, 1946  Date: 12/12/2020  PCP: [name], MD  Referral: [name], MD  Chief Complaint  Patient presents with  . Annual Exam    Part 2    This visit occurred during the SARS-CoV-2 public health emergency.  Safety protocols were in place, including screening questions prior to the visit, additional usage of staff PPE, and extensive cleaning of exam room while observing appropriate contact time as indicated for disinfecting solutions.   Patient Care Team: [name], MD as PCP - Bethena Roys, MD as PCP - Electrophysiology (Cardiology) Raynelle Bring, MD as Consulting Physician (Urology) Danella Sensing, MD as Consulting Physician (Dermatology) Rutherford Guys, MD as Consulting Physician (Ophthalmology) Thompson Grayer, MD as Consulting Physician (Cardiology) Javier Docker. Dalbert Mayotte., DMD as Referring Physician (Dentistry) Subjective:   Derek [name] is a 75 y.o. pleasant patient who presents with the following:  History of A. fib, status post ablation, no symptoms and no medication currently.  Tobacco History Reviewed. Alcohol: No concerns, no excessive use Exercise Habits: Some activity, rec at least 30 mins 5 times a week -decreased compared to his extremely active prior self.  He is working outside and walking quite a bit right now. STD concerns: no risk or activity to increase risk Drug Use: None  Exercise has decreased some. With covid  Wt Readings from Last 3 Encounters:  12/12/20 179 lb 8 oz (81.4 kg)  07/06/20 176 lb 2 oz (79.9 kg)  03/23/20 176 lb (79.8 kg)    Blood pressure stable Ideal body weight approaching as BMI 27  New onset ED with association of decreased  exercise.  Health Maintenance  Topic Date Due  . COLONOSCOPY (Pts 45-54yrs Insurance coverage will need to be confirmed)  07/02/2023  . TETANUS/TDAP  12/21/2024  . INFLUENZA VACCINE  Completed  . COVID-19 Vaccine  Completed  . Hepatitis C Screening  Completed  . PNA vac Low Risk Adult  Completed  . HPV VACCINES  Aged Out   Immunization History  Administered Date(s) Administered  . Fluad Quad(high Dose 65+) 07/07/2019  . Influenza,inj,Quad PF,6+ Mos 06/25/2013, 06/16/2014, 07/21/2015, 07/02/2016, 07/22/2017, 08/27/2018  . Influenza-Unspecified 08/22/2020  . PFIZER(Purple Top)SARS-COV-2 Vaccination 10/13/2019, 11/03/2019, 06/30/2020  . Pneumococcal Conjugate-13 05/03/2015  . Pneumococcal Polysaccharide-23 08/14/2006, 06/25/2013  . Tdap 12/22/2014  . Zoster 08/14/2006  . Zoster Recombinat (Shingrix) 07/08/2018, 09/09/2018   Patient Active Problem List   Diagnosis Date Noted  . Advanced directives, counseling/discussion 08/27/2018  . History of atrial fibrillation 10/30/2016  . Bilateral inguinal hernia (BIH) s/p lap repair 07/06/2012 06/24/2012  . PROSTATE CANCER, HX OF 11/10/2008  . CARCINOMA, BASAL CELL, HX OF 11/10/2008  . RHEUMATIC FEVER, HX OF 11/10/2008  . DIVERTICULITIS, HX OF 11/10/2008    Past Medical History:  Diagnosis Date  . Basal cell carcinoma 2009   back  . Cancer of prostate ([place])    pT2c No Mx, Gleason 3+4=7  . Chronic systolic dysfunction of left ventricle    EF 45% by echo 2/18  . Diverticulitis   . Kidney stones   . Left rotator cuff tear 07/10/2013  . Nephrolithiasis   . Paroxysmal atrial fibrillation (HCC)   . Rheumatic fever 1952-1953    Past Surgical  History:  Procedure Laterality Date  . ATRIAL FIBRILLATION ABLATION N/A 02/26/2017   Procedure: Atrial Fibrillation Ablation;  Surgeon: Thompson Grayer, MD;  Location: [facility];  Service: Cardiovascular;  Laterality: N/A;  . bilateral inguinal hernia repair  07/10/2012   lap BIH repairs   . CARDIOVERSION N/A 03/07/2017   Procedure: CARDIOVERSION;  Surgeon: Fay Records, MD;  Location: [facility] ENDOSCOPY;  Service: Cardiovascular;  Laterality: N/A;  . COLONOSCOPY    . KNEE ARTHROSCOPY W/ PARTIAL MEDIAL MENISCECTOMY  2005   Duda- arthroscopy, partial medial menisectomy-lt  . ROBOT ASSISTED LAPAROSCOPIC RADICAL PROSTATECTOMY  12/19/06   robotic prostatectomy  . rotator cuff surgery  2002   Gioffre-rt  . SHOULDER ARTHROSCOPY WITH ROTATOR CUFF REPAIR AND SUBACROMIAL DECOMPRESSION Left 07/10/2013   Procedure: LEFT SHOULDER ARTHROSCOPY WITH ARTHROSCOPIC ROTATOR CUFF REPAIR AND SUBACROMIAL DECOMPRESSION, PARTIAL ACROMIOPLASTY WITH CORACROMIAL RELEASE;  Surgeon: Johnny Bridge, MD;  Location: [facility];  Service: Orthopedics;  Laterality: Left;    Family History  Problem Relation Age of Onset  . Breast cancer Mother   . Cancer Brother 32       brain tumor  . Heart disease Other        fam hx  . Arthritis Other        other relative  . Prostate cancer Other        nephew  . Lung cancer Other 44       nephew  . Arrhythmia Father     Past Medical History, Surgical History, Social History, Family History, Problem List, Medications, and Allergies have been reviewed and updated if relevant.  Review of Systems: Pertinent positives are listed above.  Otherwise, a full 14 point review of systems has been done in full and it is negative except where it is noted positive.  Objective:   BP 90/60   Pulse 64   Temp 97.9 F (36.6 C) (Temporal)   Ht 5' 8.5" (1.74 m)   Wt 179 lb 8 oz (81.4 kg)   SpO2 99%   BMI 26.90 kg/m  Ideal Body Weight: Weight in (lb) to have BMI = 25: 166.5  Ideal Body Weight: Weight in (lb) to have BMI = 25: 166.5 No exam data present Depression screen [facility] 2/9 12/06/2020 09/07/2019 08/27/2018 07/05/2017 07/02/2016  Decreased Interest 0 0 0 0 0  Down, Depressed, Hopeless 0 0 0 0 0  PHQ - 2 Score 0 0 0 0 0  Altered sleeping 0 0 0 0 -  Tired,  decreased energy 0 0 0 0 -  Change in appetite 0 0 0 0 -  Feeling bad or failure about yourself  0 0 0 0 -  Trouble concentrating 0 0 0 0 -  Moving slowly or fidgety/restless 0 0 0 0 -  Suicidal thoughts 0 0 0 0 -  PHQ-9 Score 0 0 0 0 -  Difficult doing work/chores Not difficult at all Not difficult at all Not difficult at all Not difficult at all -     GEN: well developed, well nourished, no acute distress Eyes: conjunctiva and lids normal, PERRLA, EOMI ENT: TM clear, nares clear, oral exam WNL Neck: supple, no lymphadenopathy, no thyromegaly, no JVD Pulm: clear to auscultation and percussion, respiratory effort normal CV: regular rate and rhythm, S1-S2, no murmur, rub or gallop, no bruits, peripheral pulses normal and symmetric, no cyanosis, clubbing, edema or varicosities GI: soft, non-tender; no hepatosplenomegaly, masses; active bowel sounds all quadrants GU: deferred Lymph:  no cervical, axillary or inguinal adenopathy MSK: gait normal, muscle tone and strength WNL, no joint swelling, effusions, discoloration, crepitus  SKIN: clear, good turgor, color WNL, no rashes, lesions, or ulcerations Neuro: normal mental status, normal strength, sensation, and motion Psych: alert; oriented to person, place and time, normally interactive and not anxious or depressed in appearance.  All labs reviewed with patient. Results for orders placed or performed in visit on 12/06/20  PSA, Total with Reflex to PSA, Free  Result Value Ref Range   PSA, Total <0.1 < OR = 4.0 ng/mL  CBC with Differential/Platelet  Result Value Ref Range   WBC 5.5 4.0 - 10.5 K/uL   RBC 4.43 4.22 - 5.81 Mil/uL   Hemoglobin 14.1 13.0 - 17.0 g/dL   HCT 41.4 39.0 - 52.0 %   MCV 93.6 78.0 - 100.0 fl   MCHC 34.0 30.0 - 36.0 g/dL   RDW 13.5 11.5 - 15.5 %   Platelets 236.0 150.0 - 400.0 K/uL   Neutrophils Relative % 58.9 43.0 - 77.0 %   Lymphocytes Relative 28.6 12.0 - 46.0 %   Monocytes Relative 8.4 3.0 - 12.0 %    Eosinophils Relative 3.0 0.0 - 5.0 %   Basophils Relative 1.1 0.0 - 3.0 %   Neutro Abs 3.3 1.4 - 7.7 K/uL   Lymphs Abs 1.6 0.7 - 4.0 K/uL   Monocytes Absolute 0.5 0.1 - 1.0 K/uL   Eosinophils Absolute 0.2 0.0 - 0.7 K/uL   Basophils Absolute 0.1 0.0 - 0.1 K/uL  Basic metabolic panel  Result Value Ref Range   Sodium 138 135 - 145 mEq/L   Potassium 4.5 3.5 - 5.1 mEq/L   Chloride 104 96 - 112 mEq/L   CO2 30 19 - 32 mEq/L   Glucose, Bld 84 70 - 99 mg/dL   BUN 15 6 - 23 mg/dL   Creatinine, Ser 1.14 0.40 - 1.50 mg/dL   GFR 62.32 >60.00 mL/min   Calcium 9.4 8.4 - 10.5 mg/dL  Hepatic function panel  Result Value Ref Range   Total Bilirubin 0.7 0.2 - 1.2 mg/dL   Bilirubin, Direct 0.1 0.0 - 0.3 mg/dL   Alkaline Phosphatase 72 39 - 117 U/L   AST 28 0 - 37 U/L   ALT 16 0 - 53 U/L   Total Protein 6.7 6.0 - 8.3 g/dL   Albumin 4.1 3.5 - 5.2 g/dL  Lipid panel  Result Value Ref Range   Cholesterol 186 0 - 200 mg/dL   Triglycerides 50.0 0.0 - 149.0 mg/dL   HDL 61.60 >39.00 mg/dL   VLDL 10.0 0.0 - 40.0 mg/dL   LDL Cholesterol 114 (H) 0 - 99 mg/dL   Total CHOL/HDL Ratio 3    NonHDL 124.10     Assessment and Plan:     ICD-10-CM   1. Decreased exercise tolerance  R68.89   2. Organic impotence  N52.9   3. History of atrial fibrillation  Z86.79    He continues to do very well.  I encouraged him to eat well and increase his exercise as the weather improves.  I honestly have minimal to talk about today since he is doing so well.   Meds ordered this encounter  Medications  . sildenafil (VIAGRA) 100 MG tablet    Sig: Take 0.5-1 tablets (50-100 mg total) by mouth daily as needed for erectile dysfunction.    Dispense:  5 tablet    Refill:  11   There are no discontinued medications. No  orders of the defined types were placed in this encounter.   Signed,  Maud Deed. Zhana Jeangilles, MD   Allergies as of 12/12/2020      Reactions   Flecainide Other (See Comments)   Dizziness and fatigue       Medication List       Accurate as of December 12, 2020  1:57 PM. If you have any questions, ask your nurse or doctor.        sildenafil 100 MG tablet Commonly known as: Viagra Take 0.5-1 tablets (50-100 mg total) by mouth daily as needed for erectile dysfunction. Started by: [name], MD

## 2020-12-12 ENCOUNTER — Encounter: Payer: Self-pay | Admitting: Family Medicine

## 2020-12-12 ENCOUNTER — Other Ambulatory Visit: Payer: Self-pay

## 2020-12-12 ENCOUNTER — Ambulatory Visit (INDEPENDENT_AMBULATORY_CARE_PROVIDER_SITE_OTHER): Payer: Medicare Other | Admitting: Family Medicine

## 2020-12-12 VITALS — BP 90/60 | HR 64 | Temp 97.9°F | Ht 68.5 in | Wt 179.5 lb

## 2020-12-12 DIAGNOSIS — N529 Male erectile dysfunction, unspecified: Secondary | ICD-10-CM

## 2020-12-12 DIAGNOSIS — Z8679 Personal history of other diseases of the circulatory system: Secondary | ICD-10-CM

## 2020-12-12 DIAGNOSIS — R6889 Other general symptoms and signs: Secondary | ICD-10-CM | POA: Diagnosis not present

## 2020-12-12 DIAGNOSIS — Z0000 Encounter for general adult medical examination without abnormal findings: Secondary | ICD-10-CM

## 2020-12-12 MED ORDER — SILDENAFIL CITRATE 100 MG PO TABS: 50.0000 mg | ORAL_TABLET | Freq: Every day | ORAL | 11 refills | Status: DC | PRN

## 2021-01-04 ENCOUNTER — Encounter: Payer: Self-pay | Admitting: Primary Care

## 2021-01-04 ENCOUNTER — Ambulatory Visit (INDEPENDENT_AMBULATORY_CARE_PROVIDER_SITE_OTHER): Payer: Medicare Other | Admitting: Primary Care

## 2021-01-04 ENCOUNTER — Telehealth: Payer: Self-pay | Admitting: Family Medicine

## 2021-01-04 ENCOUNTER — Other Ambulatory Visit: Payer: Self-pay

## 2021-01-04 VITALS — BP 100/58 | HR 109 | Temp 98.2°F | Ht 68.5 in | Wt 178.0 lb

## 2021-01-04 DIAGNOSIS — I4891 Unspecified atrial fibrillation: Secondary | ICD-10-CM

## 2021-01-04 DIAGNOSIS — R000 Tachycardia, unspecified: Secondary | ICD-10-CM | POA: Diagnosis not present

## 2021-01-04 MED ORDER — RIVAROXABAN 20 MG PO TABS: 20.0000 mg | ORAL_TABLET | Freq: Every day | ORAL | 0 refills | Status: DC

## 2021-01-04 MED ORDER — METOPROLOL TARTRATE 25 MG PO TABS: 12.5000 mg | ORAL_TABLET | Freq: Two times a day (BID) | ORAL | 0 refills | Status: DC

## 2021-01-04 NOTE — Telephone Encounter (Signed)
Please call and triage patient.  

## 2021-01-04 NOTE — Telephone Encounter (Signed)
Patient has an appointment today with Allie Bossier, NP at 2:20.

## 2021-01-04 NOTE — Progress Notes (Signed)
Subjective:    Patient ID: Derek [name], male    DOB: Jan 30, 1946, 75 y.o.   MRN: [id number]  HPI  Derek [name] is a very pleasant 75 y.o. male patient of Dr. Lorelei Pont with a history of prostate cancer, atrial fibrillation s/p ablation, rheumatic fever, diverticulitis who presents today with a chief complaint of tachycardia.  Evaluated by his PCP on 12/12/20 for annual wellness visit, no symptoms noted. History of atrial fibrillation with cardiac ablation in 2018. Not taking beta blockers or anticoagulant.   He woke this morning, was walking around his home and noticed shortness of breath. This prompted him to check his pulse rate and noticed it jumping around from 50 to 104 which has been continuous throughout the day today. Just prior to his appointment he's noticed the rate increasing as high as 116. This morning he proceeded to the dentist (after symptoms began) to have a cap fixed. He's not had any shortness of breath since this morning.   He also denies fatigue, dizziness, chest pain, palpitations, new supplements, changes in his activity level, changes in diet. He leads an active life, hasn't done anything out of the usual. He walked 4 miles several days ago and had no issues.   Follows with cardiology, last visit in May 2021, no changes at this visit.   BP Readings from Last 3 Encounters:  01/04/21 (!) 100/58  12/12/20 90/60  07/06/20 116/78      Review of Systems  Constitutional: Negative for fatigue.  Respiratory: Negative for shortness of breath.   Cardiovascular: Negative for chest pain and palpitations.  Neurological: Negative for dizziness and headaches.         Past Medical History:  Diagnosis Date  . Basal cell carcinoma 2009   back  . Cancer of prostate ([place])    pT2c No Mx, Gleason 3+4=7  . Chronic systolic dysfunction of left ventricle    EF 45% by echo 2/18  . Diverticulitis   . Kidney stones   . Left rotator cuff tear 07/10/2013  . Nephrolithiasis    . Paroxysmal atrial fibrillation (HCC)   . Rheumatic fever 1952-1953    Social History   Socioeconomic History  . Marital status: Married    Spouse name: Not on file  . Number of children: Not on file  . Years of education: Not on file  . Highest education level: Not on file  Occupational History  . Occupation: retired Licensed conveyancer)    Employer: [employer]  Tobacco Use  . Smoking status: Former Smoker    Quit date: 09/25/1971    Years since quitting: 49.3  . Smokeless tobacco: Never Used  Vaping Use  . Vaping Use: Never used  Substance and Sexual Activity  . Alcohol use: No  . Drug use: No  . Sexual activity: Not on file  Other Topics Concern  . Not on file  Social History Narrative   Regular exercise: yes - runner 5d/wk, former marathon runner   Diet: no red meat, good water, fruits/vegetables daily    Social Determinants of Health   Financial Resource Strain: Not on file  Food Insecurity: Not on file  Transportation Needs: Not on file  Physical Activity: Not on file  Stress: Not on file  Social Connections: Not on file  Intimate Partner Violence: Not on file    Past Surgical History:  Procedure Laterality Date  . ATRIAL FIBRILLATION ABLATION N/A 02/26/2017   Procedure: Atrial Fibrillation Ablation;  Surgeon: Allred,  Jeneen Rinks, MD;  Location: [place] [facility];  Service: Cardiovascular;  Laterality: N/A;  . bilateral inguinal hernia repair  07/10/2012   lap BIH repairs  . CARDIOVERSION N/A 03/07/2017   Procedure: CARDIOVERSION;  Surgeon: Fay Records, MD;  [facility]: [facility] ENDOSCOPY;  Service: Cardiovascular;  Laterality: N/A;  . COLONOSCOPY    . KNEE ARTHROSCOPY W/ PARTIAL MEDIAL MENISCECTOMY  2005   Duda- arthroscopy, partial medial menisectomy-lt  . ROBOT ASSISTED LAPAROSCOPIC RADICAL PROSTATECTOMY  12/19/06   robotic prostatectomy  . rotator cuff surgery  2002   Gioffre-rt  . SHOULDER ARTHROSCOPY WITH ROTATOR CUFF REPAIR AND SUBACROMIAL DECOMPRESSION  Left 07/10/2013   Procedure: LEFT SHOULDER ARTHROSCOPY WITH ARTHROSCOPIC ROTATOR CUFF REPAIR AND SUBACROMIAL DECOMPRESSION, PARTIAL ACROMIOPLASTY WITH CORACROMIAL RELEASE;  Surgeon: Johnny Bridge, MD;  Location: [place];  Service: Orthopedics;  Laterality: Left;    Family History  Problem Relation Age of Onset  . Breast cancer Mother   . Cancer Brother 65       brain tumor  . Heart disease Other        fam hx  . Arthritis Other        other relative  . Prostate cancer Other        nephew  . Lung cancer Other 14       nephew  . Arrhythmia Father     Allergies  Allergen Reactions  . Flecainide Other (See Comments)    Dizziness and fatigue    Current Outpatient Medications on File Prior to Visit  Medication Sig Dispense Refill  . sildenafil (VIAGRA) 100 MG tablet Take 0.5-1 tablets (50-100 mg total) by mouth daily as needed for erectile dysfunction. 5 tablet 11   No current facility-administered medications on file prior to visit.    BP (!) 100/58   Pulse (!) 109 Comment: readings in office 82-109  Temp 98.2 F (36.8 C) (Temporal)   Ht 5' 8.5" (1.74 m)   Wt 178 lb (80.7 kg)   SpO2 97%   BMI 26.67 kg/m  Objective:   Physical Exam Cardiovascular:     Rate and Rhythm: Tachycardia present. Rhythm irregular.  Pulmonary:     Effort: Pulmonary effort is normal.     Breath sounds: Normal breath sounds. No wheezing or rales.  Musculoskeletal:     Cervical back: Neck supple.  Skin:    General: Skin is warm and dry.  Neurological:     Mental Status: He is alert and oriented to person, place, and time.           Assessment & Plan:      This visit occurred during the SARS-CoV-2 public health emergency.  Safety protocols were in place, including screening questions prior to the visit, additional usage of staff PPE, and extensive cleaning of exam room while observing appropriate contact time as indicated for disinfecting solutions.

## 2021-01-04 NOTE — Telephone Encounter (Signed)
Derek [name] called in stating that he was checking his heart rate and it seems like it is out rhythm its running  60-104

## 2021-01-04 NOTE — Progress Notes (Signed)
PCP:  [name], MD Primary Cardiologist: No primary care provider on file. Electrophysiologist: Thompson Grayer, MD   Derek [name] is a 75 y.o. male seen today for Thompson Grayer, MD for acute visit due to recurrent atrial fibrillation.  Since last being seen in our clinic the patient reports doing overall OK. He really only feels like he went out of rhythm yesterday am, but can't be sure. He noted mild palpitations, DOE with moderate exertion and mild fatigue. he denies chest pain,  PND, orthopnea, nausea, vomiting, dizziness, syncope, edema, weight gain, or early satiety.  Past Medical History:  Diagnosis Date  . Basal cell carcinoma 2009   back  . Cancer of prostate ([place])    pT2c No Mx, Gleason 3+4=7  . Chronic systolic dysfunction of left ventricle    EF 45% by echo 2/18  . Diverticulitis   . Kidney stones   . Left rotator cuff tear 07/10/2013  . Nephrolithiasis   . Paroxysmal atrial fibrillation (HCC)   . Rheumatic fever 1952-1953   Past Surgical History:  Procedure Laterality Date  . ATRIAL FIBRILLATION ABLATION N/A 02/26/2017   Procedure: Atrial Fibrillation Ablation;  Surgeon: [place] Grayer, MD;  Location: [place] [facility];  Service: Cardiovascular;  Laterality: N/A;  . bilateral inguinal hernia repair  07/10/2012   lap BIH repairs  . CARDIOVERSION N/A 03/07/2017   Procedure: CARDIOVERSION;  Surgeon: Fay Records, MD;  Location: [facility] ENDOSCOPY;  Service: Cardiovascular;  Laterality: N/A;  . COLONOSCOPY    . KNEE ARTHROSCOPY W/ PARTIAL MEDIAL MENISCECTOMY  2005   Duda- arthroscopy, partial medial menisectomy-lt  . ROBOT ASSISTED LAPAROSCOPIC RADICAL PROSTATECTOMY  12/19/06   robotic prostatectomy  . rotator cuff surgery  2002   Gioffre-rt  . SHOULDER ARTHROSCOPY WITH ROTATOR CUFF REPAIR AND SUBACROMIAL DECOMPRESSION Left 07/10/2013   Procedure: LEFT SHOULDER ARTHROSCOPY WITH ARTHROSCOPIC ROTATOR CUFF REPAIR AND SUBACROMIAL DECOMPRESSION, PARTIAL ACROMIOPLASTY WITH  CORACROMIAL RELEASE;  Surgeon: Johnny Bridge, MD;  Location: [place];  Service: Orthopedics;  Laterality: Left;    Current Outpatient Medications  Medication Sig Dispense Refill  . metoprolol tartrate (LOPRESSOR) 25 MG tablet Take 0.5 tablets (12.5 mg total) by mouth 2 (two) times daily. For heart rate. 90 tablet 0  . rivaroxaban (XARELTO) 20 MG TABS tablet Take 1 tablet (20 mg total) by mouth daily with supper. 90 tablet 0  . sildenafil (VIAGRA) 100 MG tablet Take 0.5-1 tablets (50-100 mg total) by mouth daily as needed for erectile dysfunction. 5 tablet 11   No current facility-administered medications for this visit.    Allergies  Allergen Reactions  . Flecainide Other (See Comments)    Dizziness and fatigue    Social History   Socioeconomic History  . Marital status: Married    Spouse name: Not on file  . Number of children: Not on file  . Years of education: Not on file  . Highest education level: Not on file  Occupational History  . Occupation: retired Licensed conveyancer)    Employer: [employer]  Tobacco Use  . Smoking status: Former Smoker    Quit date: 09/25/1971    Years since quitting: 49.3  . Smokeless tobacco: Never Used  Vaping Use  . Vaping Use: Never used  Substance and Sexual Activity  . Alcohol use: No  . Drug use: No  . Sexual activity: Not on file  Other Topics Concern  . Not on file  Social History Narrative   Regular exercise:  yes - runner 5d/wk, former marathon runner   Diet: no red meat, good water, fruits/vegetables daily    Social Determinants of Health   Financial Resource Strain: Not on file  Food Insecurity: Not on file  Transportation Needs: Not on file  Physical Activity: Not on file  Stress: Not on file  Social Connections: Not on file  Intimate Partner Violence: Not on file     Review of Systems: General: No chills, fever, night sweats or weight changes  Cardiovascular:  No chest pain, dyspnea on  exertion, edema, orthopnea, palpitations, paroxysmal nocturnal dyspnea Dermatological: No rash, lesions or masses Respiratory: No cough, dyspnea Urologic: No hematuria, dysuria Abdominal: No nausea, vomiting, diarrhea, bright red blood per rectum, melena, or hematemesis Neurologic: No visual changes, weakness, changes in mental status All other systems reviewed and are otherwise negative except as noted above.  Physical Exam: Vitals:   01/05/21 0823  BP: 96/62  Pulse: 83  SpO2: 96%  Weight: 179 lb 3.2 oz (81.3 kg)  Height: 5\' 10"  (1.778 m)    GEN- The patient is well appearing, alert and oriented x 3 today.   HEENT: normocephalic, atraumatic; sclera clear, conjunctiva pink; hearing intact; oropharynx clear; neck supple, no JVP Lymph- no cervical lymphadenopathy Lungs- Clear to ausculation bilaterally, normal work of breathing.  No wheezes, rales, rhonchi Heart- Irregularly irregular rate and rhythm, no murmurs, rubs or gallops, PMI not laterally displaced GI- soft, non-tender, non-distended, bowel sounds present, no hepatosplenomegaly Extremities- no clubbing, cyanosis, or edema; DP/PT/radial pulses 2+ bilaterally MS- no significant deformity or atrophy Skin- warm and dry, no rash or lesion Psych- euthymic mood, full affect Neuro- strength and sensation are intact  EKG is not ordered. Personal review of EKG from 01/04/2021 shows AF with RVR at 102 bpm  Additional studies reviewed include: Previous EP office notes and PCP notes.  Assessment and Plan:  1. Persistent Atrial Fibrillation s/p Ablation 02/2017 He is now back in AF with variable rate control Has previously failed flecainide Has previously tolerated short term amiodarone. Will avoid for now in the event he is candidate for re-do ablation. He is also rate controlled currently.  We discussed tikosyn, but he would first like to pursue [facility] and determine if he is a candidate for re-do ablation.  He preferred to avoid TEE so  we will plan for this in 3 weeks after being on Xarelto for that time.  Will also update Echo to help further clarify candidacy.  CHA2DS2VASC of at least 2 due to age.    Continue lopressor 12.5 mg BID for rate control.   Shirley Friar, PA-C  01/05/21 8:30 AM

## 2021-01-04 NOTE — Telephone Encounter (Signed)
SENT PT TO ACCESS NURSE

## 2021-01-04 NOTE — Patient Instructions (Addendum)
Start Xarelto 20 mg once daily to prevent clotting in the blood.  Start metoprolol tartrate 25 mg, take 1/2 tablet (12.5 mg) twice daily for heart rate control.  Please meet with Anastasiya to get scheduled with Dr. [name].   It was a pleasure meeting you!

## 2021-01-04 NOTE — Telephone Encounter (Signed)
Patient was sent to Access Nurse will wait for note from them.

## 2021-01-04 NOTE — Assessment & Plan Note (Signed)
S/P cardiac ablation in 2018. Today he is clearly in atrial fibrillation, ECG today confirms. Rate of 102.  ChadsVasc score of 2 given age, was on Xarelto previously and did well. Rx for 20 mg dose provided. Rx for metoprolol tartrate 12.5 mg BID provided for rate control.   We will work to get him in with Dr. [name] or his group for follow up.

## 2021-01-04 NOTE — Telephone Encounter (Signed)
[facility] - Client TELEPHONE ADVICE RECORD AccessNurse Patient Name: Derek [name] [place] Gender: Male DOB: 09/20/1946 Age: 75 Y 42 M 2 D Return Phone Number: [phone] (Primary) Address: City/ State/ Zip: [place]  [address] Client [facility] - Client Client Site [facility] - Day Physician Copland, Frederico Hamman - MD Contact Type Call Who Is Calling Patient / Member / Family / Caregiver Call Type Triage / Clinical Relationship To Patient Self Return Phone Number [phone] (Primary) Chief Complaint Heart palpitations or irregular heartbeat Reason for Call Symptomatic / Request for [place] said his heart rate is ranging from 58 to 104 and maybe out of rhythm again. Caller said it just started this morning and felt light headed. Caller said he took a couple of tylenols last night before bed for stiffness in shoulder. Caller said he took a low dose of aspirin. Caller said he is concerned since he has had several procedures in the past for this issue. Translation No Nurse Assessment Nurse: Ysidro Evert, RN, Levada Dy Date/Time Eilene Ghazi Time): 01/04/2021 12:28:01 PM Confirm and document reason for call. If symptomatic, describe symptoms. ---Caller states his heart rate has been fluctuating from 50s to 100s. He states he felt a little short of breath this morning and noticed it. He denies any symptoms now. He was diagnosed with a-fib a few years ago but it resolved after ablation Does the patient have any new or worsening symptoms? ---Yes Will a triage be completed? ---Yes Related visit to physician within the last 2 weeks? ---No Does the PT have any chronic conditions? (i.e. diabetes, asthma, this includes High risk factors for pregnancy, etc.) ---No Is this a behavioral health or substance abuse call? ---No Guidelines Guideline Title Affirmed Question Affirmed Notes  Nurse Date/Time (Eastern Time) Heart Rate and Heartbeat Questions History of heart disease (i.e., heart attack, bypass surgery, angina, angioplasty, CHF) Ysidro Evert, RN, Levada Dy 01/04/2021 12:32:40 PM PLEASE NOTE: All timestamps contained within this report are represented as [place] Standard Time. CONFIDENTIALTY NOTICE: This fax transmission is intended only for the addressee. It contains information that is legally privileged, confidential or otherwise protected from use or disclosure. If you are not the intended recipient, you are strictly prohibited from reviewing, disclosing, copying using or disseminating any of this information or taking any action in reliance on or regarding this information. If you have received this fax in error, please notify us immediately by telephone so that we can arrange for its return to [place]. Phone: [phone], Toll-Free: [phone], Fax: [phone] Page: 2 of 2 Call Id: [id number] Guidelines Guideline Title Affirmed Question Affirmed Notes Nurse Date/Time Eilene Ghazi Time) (Exception: brief heartbeat symptoms that went away and now feels well) Disp. Time Eilene Ghazi Time) Disposition Final User 01/04/2021 12:40:40 PM See HCP within 4 Hours (or PCP triage) Yes Ysidro Evert, RN, Marin Shutter Disagree/Comply Comply Caller Understands Yes PreDisposition Did not know what to do Care Advice Given Per Guideline SEE HCP (OR PCP TRIAGE) WITHIN 4 HOURS: CARE ADVICE given per Heart Rate and Heartbeat Questions (Adult) guideline. CALL BACK IF: * You become worse Referrals Warm transfer to backline

## 2021-01-05 ENCOUNTER — Encounter: Payer: Self-pay | Admitting: Student

## 2021-01-05 ENCOUNTER — Ambulatory Visit (INDEPENDENT_AMBULATORY_CARE_PROVIDER_SITE_OTHER): Payer: Medicare Other | Admitting: Student

## 2021-01-05 ENCOUNTER — Other Ambulatory Visit: Payer: Self-pay | Admitting: Student

## 2021-01-05 VITALS — BP 96/62 | HR 83 | Ht 70.0 in | Wt 179.2 lb

## 2021-01-05 DIAGNOSIS — I4819 Other persistent atrial fibrillation: Secondary | ICD-10-CM

## 2021-01-05 DIAGNOSIS — Z8679 Personal history of other diseases of the circulatory system: Secondary | ICD-10-CM

## 2021-01-05 NOTE — Patient Instructions (Addendum)
You are scheduled for a Cardioversion on 01/26/2021 with Dr. Marlou Porch.  Please arrive at the [facility] (Main Entrance A) at [facility]: [address] [place], [place] [address] at 7:30 am.  DIET: Nothing to eat or drink after midnight except a sip of water with medications (see medication instructions below)  Medication Instructions:  Continue your anticoagulant: Xarelto You will need to continue your anticoagulant after your procedure until you are told by your  Provider that it is safe to stop   Labs: 01/24/2021  Come to the lab at [facility] between the hours of 7:30 am and 4:30 pm. You do not have to be fasting.  You must have a responsible person to drive you home and stay in the waiting area during your procedure. Failure to do so could result in cancellation.  Bring your insurance cards.  *Special Note: Every effort is made to have your procedure done on time. Occasionally there are emergencies that occur at the hospital that may cause delays. Please be patient if a delay does occur.   ------------------------------------------------------------------------------------------------------------------------------------  Due to recent COVID-19 restrictions implemented by our local and state authorities and in an effort to keep both patients and staff as safe as possible, our hospital system requires COVID-19 testing prior to certain scheduled hospital procedures.  Please go to [place]. [place], [place] [address] on 01/24/2021 at 9:00  .  This is a drive up testing site.  You will not need to exit your vehicle.  You will not be billed at the time of testing but may receive a bill later depending on your insurance. You must agree to self-quarantine from the time of your testing until the procedure date on 01/26/2021.  This should included staying home with ONLY the people you live with.  Avoid take-out, grocery store shopping or leaving the house for any non-emergent  reason.  Failure to have your COVID-19 test done on the date and time you have been scheduled will result in cancellation of your procedure.  Please call our office at [phone] if you have any questions.

## 2021-01-09 ENCOUNTER — Telehealth: Payer: Self-pay | Admitting: Internal Medicine

## 2021-01-09 NOTE — Telephone Encounter (Signed)
Patient states he has not been in afib since 01/04/21 and is no longer having issues.  He would like to know if he still needs to have cardioversion on 01/26/21. Please advise.

## 2021-01-09 NOTE — Telephone Encounter (Signed)
Advised to continue current medication and give the office a call back the week of his procedure IF he still feels like he is in NSR. For now advised to leave DCCV as scheduled because he could go back into Afib before 01/26/21. Patient in agreement and verbalized understanding.

## 2021-01-23 ENCOUNTER — Telehealth: Payer: Self-pay

## 2021-01-23 ENCOUNTER — Ambulatory Visit (INDEPENDENT_AMBULATORY_CARE_PROVIDER_SITE_OTHER): Payer: Medicare Other

## 2021-01-23 VITALS — BP 118/79 | HR 53 | Wt 177.6 lb

## 2021-01-23 DIAGNOSIS — I480 Paroxysmal atrial fibrillation: Secondary | ICD-10-CM | POA: Diagnosis not present

## 2021-01-23 NOTE — Progress Notes (Signed)
Pt presents today at the request of Oda Kilts for an EKG as pt states he is no longer in Afib since starting Metoprolol 12.5mg .  Pt reports he is now taking 1/4 tablet of Metoprolol 25mg  d/t HR being in the 30's on 12.5mg .  Pt states he believes he has been in NSR since 04/16. Pt reports he is feeling well and would like to cancel his DCCV and wait for his appointment with Dr [name] scheduled for 02/03/2021.  EKG completed and taken to Dr Ross,DOD.  EKG reviewed and signed by Dr Harrington Challenger.  EKG currently show sinus brady at 53bpm.  She asks that information be sent to Oda Kilts, PA-C for review.  Will forward to [place] and await his recommendation. Pt verbalizes understanding and agrees with current plan.

## 2021-01-23 NOTE — Patient Instructions (Addendum)
Medication Instructions:  Your physician recommends that you continue on your current medications as directed. Please refer to the Current Medication list given to you today.  *If you need a refill on your cardiac medications before your next appointment, please call your pharmacy*   Lab Work: None  If you have labs (blood work) drawn today and your tests are completely normal, you will receive your results only by: Marland Kitchen MyChart Message (if you have MyChart) OR . A paper copy in the mail If you have any lab test that is abnormal or we need to change your treatment, we will call you to review the results.   Testing/Procedures: EKG today   Follow-Up: At [facility], you and your health needs are our priority.  As part of our continuing mission to provide you with exceptional heart care, we have created designated Provider Care Teams.  These Care Teams include your primary Cardiologist (physician) and Advanced Practice Providers (APPs -  Physician Assistants and Nurse Practitioners) who all work together to provide you with the care you need, when you need it.  We recommend signing up for the patient portal called "MyChart".  Sign up information is provided on this After Visit Summary.  MyChart is used to connect with patients for Virtual Visits (Telemedicine).  Patients are able to view lab/test results, encounter notes, upcoming appointments, etc.  Non-urgent messages can be sent to your provider as well.   To learn more about what you can do with MyChart, go to [email or web address].    Your next appointment:   As scheduled

## 2021-01-23 NOTE — Telephone Encounter (Signed)
Attempted phone call to pt.  Per Epic ok to leave detailed voicemail.  Pt advised will cancel DCCV, lab and covid screening appointments.  Please keep appointments as scheduled for echo and Dr [name].  Call office at [phone] for any further questions.

## 2021-01-24 ENCOUNTER — Other Ambulatory Visit: Payer: Medicare Other

## 2021-01-24 ENCOUNTER — Other Ambulatory Visit (HOSPITAL_COMMUNITY): Payer: Medicare Other

## 2021-01-26 ENCOUNTER — Ambulatory Visit (HOSPITAL_COMMUNITY): Admission: RE | Admit: 2021-01-26 | Payer: Medicare Other | Source: Home / Self Care | Admitting: Cardiology

## 2021-01-26 ENCOUNTER — Encounter (HOSPITAL_COMMUNITY): Admission: RE | Payer: Self-pay | Source: Home / Self Care

## 2021-01-26 SURGERY — CARDIOVERSION
Anesthesia: General

## 2021-02-01 ENCOUNTER — Ambulatory Visit (HOSPITAL_COMMUNITY): Payer: Medicare Other | Attending: Cardiology

## 2021-02-01 ENCOUNTER — Other Ambulatory Visit: Payer: Self-pay

## 2021-02-01 DIAGNOSIS — I4819 Other persistent atrial fibrillation: Secondary | ICD-10-CM | POA: Diagnosis not present

## 2021-02-01 LAB — ECHOCARDIOGRAM COMPLETE
Area-P 1/2: 4.1 cm2
P 1/2 time: 627 msec
S' Lateral: 3.5 cm

## 2021-02-02 NOTE — Telephone Encounter (Signed)
Spoke to the patient who is asymptomatic.  HR 94-103. Not able to give BP. Appointment in place already for tomorrow. Give patient ED precautions. Verbalized understanding.

## 2021-02-03 ENCOUNTER — Encounter: Payer: Self-pay | Admitting: *Deleted

## 2021-02-03 ENCOUNTER — Ambulatory Visit (INDEPENDENT_AMBULATORY_CARE_PROVIDER_SITE_OTHER): Payer: Medicare Other | Admitting: Internal Medicine

## 2021-02-03 ENCOUNTER — Other Ambulatory Visit: Payer: Self-pay

## 2021-02-03 ENCOUNTER — Encounter: Payer: Self-pay | Admitting: Internal Medicine

## 2021-02-03 VITALS — BP 100/66 | HR 86 | Ht 70.0 in | Wt 177.4 lb

## 2021-02-03 DIAGNOSIS — D6869 Other thrombophilia: Secondary | ICD-10-CM

## 2021-02-03 DIAGNOSIS — I4891 Unspecified atrial fibrillation: Secondary | ICD-10-CM | POA: Diagnosis not present

## 2021-02-03 DIAGNOSIS — I480 Paroxysmal atrial fibrillation: Secondary | ICD-10-CM

## 2021-02-03 DIAGNOSIS — I4819 Other persistent atrial fibrillation: Secondary | ICD-10-CM | POA: Diagnosis not present

## 2021-02-03 NOTE — Patient Instructions (Signed)
Medication Instructions:  Your physician recommends that you continue on your current medications as directed. Please refer to the Current Medication list given to you today.  Labwork: None ordered.  Testing/Procedures: Your physician has requested that you have cardiac CT. Cardiac computed tomography (CT) is a painless test that uses an x-ray machine to take clear, detailed pictures of your heart. For further information please visit www.cardiosmart.org. Please follow instruction sheet as given.  Your physician has recommended that you have an ablation. Catheter ablation is a medical procedure used to treat some cardiac arrhythmias (irregular heartbeats). During catheter ablation, a long, thin, flexible tube is put into a blood vessel in your groin (upper thigh), or neck. This tube is called an ablation catheter. It is then guided to your heart through the blood vessel. Radio frequency waves destroy small areas of heart tissue where abnormal heartbeats may cause an arrhythmia to start. Please see the instruction sheet given to you today.    Any Other Special Instructions Will Be Listed Below (If Applicable).  If you need a refill on your cardiac medications before your next appointment, please call your pharmacy.   . Cardiac Ablation Cardiac ablation is a procedure to destroy (ablate) some heart tissue that is sending bad signals. These bad signals cause problems in heart rhythm. The heart has many areas that make these signals. If there are problems in these areas, they can make the heart beat in a way that is not normal. Destroying some tissues can help make the heart rhythm normal. Tell your doctor about:  Any allergies you have.  All medicines you are taking. These include vitamins, herbs, eye drops, creams, and over-the-counter medicines.  Any problems you or family members have had with medicines that make you fall asleep (anesthetics).  Any blood disorders you have.  Any  surgeries you have had.  Any medical conditions you have, such as kidney failure.  Whether you are pregnant or may be pregnant. What are the risks? This is a safe procedure. But problems may occur, including:  Infection.  Bruising and bleeding.  Bleeding into the chest.  Stroke or blood clots.  Damage to nearby areas of your body.  Allergies to medicines or dyes.  The need for a pacemaker if the normal system is damaged.  Failure of the procedure to treat the problem. What happens before the procedure? Medicines Ask your doctor about:  Changing or stopping your normal medicines. This is important.  Taking aspirin and ibuprofen. Do not take these medicines unless your doctor tells you to take them.  Taking other medicines, vitamins, herbs, and supplements. General instructions  Follow instructions from your doctor about what you cannot eat or drink.  Plan to have someone take you home from the hospital or clinic.  If you will be going home right after the procedure, plan to have someone with you for 24 hours.  Ask your doctor what steps will be taken to prevent infection. What happens during the procedure?  An IV tube will be put into one of your veins.  You will be given a medicine to help you relax.  The skin on your neck or groin will be numbed.  A cut (incision) will be made in your neck or groin. A needle will be put through your cut and into a large vein.  A tube (catheter) will be put into the needle. The tube will be moved to your heart.  Dye may be put through the tube. This helps   your doctor see your heart.  Small devices (electrodes) on the tube will send out signals.  A type of energy will be used to destroy some heart tissue.  The tube will be taken out.  Pressure will be held on your cut. This helps stop bleeding.  A bandage will be put over your cut. The exact procedure may vary among doctors and hospitals.   What happens after the  procedure?  You will be watched until you leave the hospital or clinic. This includes checking your heart rate, breathing rate, oxygen, and blood pressure.  Your cut will be watched for bleeding. You will need to lie still for a few hours.  Do not drive for 24 hours or as long as your doctor tells you. Summary  Cardiac ablation is a procedure to destroy some heart tissue. This is done to treat heart rhythm problems.  Tell your doctor about any medical conditions you may have. Tell him or her about all medicines you are taking to treat them.  This is a safe procedure. But problems may occur. These include infection, bruising, bleeding, and damage to nearby areas of your body.  Follow what your doctor tells you about food and drink. You may also be told to change or stop some of your medicines.  After the procedure, do not drive for 24 hours or as long as your doctor tells you. This information is not intended to replace advice given to you by your health care provider. Make sure you discuss any questions you have with your health care provider. Document Revised: 08/13/2019 Document Reviewed: 08/13/2019 Elsevier Patient Education  2021 Elsevier Inc.        

## 2021-02-03 NOTE — Progress Notes (Signed)
Pt has been made aware of normal result and verbalized understanding.  jw

## 2021-02-03 NOTE — Progress Notes (Signed)
PCP: [name], MD   Primary EP: Dr Jinny Sanders is a 75 y.o. male who presents today for routine electrophysiology followup.  Since last being seen in our clinic, the patient reports doing very well.  He has done well since his ablation 02/2017 until 4/22.  He has returned to persistent afib.  + fatigue and decreased exercise tolerance.  Today, he denies symptoms of palpitations, chest pain, shortness of breath,  lower extremity edema, dizziness, presyncope, or syncope.  The patient is otherwise without complaint today.   Past Medical History:  Diagnosis Date  . Basal cell carcinoma 2009   back  . Cancer of prostate ([place])    pT2c No Mx, Gleason 3+4=7  . Chronic systolic dysfunction of left ventricle    EF 45% by echo 2/18  . Diverticulitis   . Kidney stones   . Left rotator cuff tear 07/10/2013  . Nephrolithiasis   . Paroxysmal atrial fibrillation ([facility])   . Rheumatic fever 1952-1953   Past Surgical History:  Procedure Laterality Date  . ATRIAL FIBRILLATION ABLATION N/A 02/26/2017   Procedure: Atrial Fibrillation Ablation;  Surgeon: Thompson Grayer, MD;  Location: [facility];  Service: Cardiovascular;  Laterality: N/A;  . bilateral inguinal hernia repair  07/10/2012   lap BIH repairs  . CARDIOVERSION N/A 03/07/2017   Procedure: CARDIOVERSION;  Surgeon: Fay Records, MD;  Location[facility];  Service: Cardiovascular;  Laterality: N/A;  . COLONOSCOPY    . KNEE ARTHROSCOPY W/ PARTIAL MEDIAL MENISCECTOMY  2005   Duda- arthroscopy, partial medial menisectomy-lt  . ROBOT ASSISTED LAPAROSCOPIC RADICAL PROSTATECTOMY  12/19/06   robotic prostatectomy  . rotator cuff surgery  2002   Gioffre-rt  . SHOULDER ARTHROSCOPY WITH ROTATOR CUFF REPAIR AND SUBACROMIAL DECOMPRESSION Left 07/10/2013   Procedure: LEFT SHOULDER ARTHROSCOPY WITH ARTHROSCOPIC ROTATOR CUFF REPAIR AND SUBACROMIAL DECOMPRESSION, PARTIAL ACROMIOPLASTY WITH CORACROMIAL RELEASE;  Surgeon: Johnny Bridge, MD;  Location: [place];  Service: Orthopedics;  Laterality: Left;    ROS- all systems are reviewed and negatives except as per HPI above  Current Outpatient Medications  Medication Sig Dispense Refill  . Cholecalciferol (VITAMIN D) 50 MCG (2000 UT) tablet Take 2,000 Units by mouth daily.    . metoprolol tartrate (LOPRESSOR) 25 MG tablet Take 0.5 tablets (12.5 mg total) by mouth 2 (two) times daily. For heart rate. 90 tablet 0  . rivaroxaban (XARELTO) 20 MG TABS tablet Take 1 tablet (20 mg total) by mouth daily with supper. 90 tablet 0  . Zinc Sulfate (ZINC 15 PO) Take 15 mg by mouth daily.     No current facility-administered medications for this visit.    Physical Exam: Vitals:   02/03/21 1011  BP: 100/66  Pulse: 86  SpO2: 96%  Weight: 177 lb 6.4 oz (80.5 kg)  Height: 5\' 10"  (1.778 m)    GEN- The patient is well appearing, alert and oriented x 3 today.   Head- normocephalic, atraumatic Eyes-  Sclera clear, conjunctiva pink Ears- hearing intact Oropharynx- clear Lungs-   normal work of breathing Heart- irregular rate and rhythm  GI- soft  Extremities- no clubbing, cyanosis, or edema  Wt Readings from Last 3 Encounters:  02/03/21 177 lb 6.4 oz (80.5 kg)  01/23/21 177 lb 9.6 oz (80.6 kg)  01/05/21 179 lb 3.2 oz (81.3 kg)    EKG tracing ordered today is personally reviewed and shows afib  Echo 02/01/21- EF 50-55%, mild to moderate AI  Assessment and Plan:  1. Paroxysmal atrial fibrillation Has progressed to persistent The patient has symptomatic, recurrent atrial fibrillation. he has failed medical therapy with flecainide.  He underwent ablation by me 02/2017 and did very well for several years. Chads2vasc score is at least 2.  he is anticoagulated with xarelto . Therapeutic strategies for afib including medicine (tikosyn, amiodarone) and ablation were discussed in detail with the patient today. Risk, benefits, and alternatives to EP study and  radiofrequency ablation for afib were also discussed in detail today. These risks include but are not limited to stroke, bleeding, vascular damage, tamponade, perforation, damage to the esophagus, lungs, and other structures, pulmonary vein stenosis, worsening renal function, and death. The patient understands these risk and wishes to proceed.  We will therefore proceed with catheter ablation at the next available time.  Carto, ICE, anesthesia are requested for the procedure.  Will also obtain cardiac prior to the procedure to exclude LAA thrombus and further evaluate atrial anatomy.  Given prior ablation, he requires cardiac CT rather than TEE.   Thompson Grayer MD, [facility] 02/03/2021 10:31 AM

## 2021-02-17 ENCOUNTER — Telehealth: Payer: Self-pay | Admitting: Internal Medicine

## 2021-02-17 NOTE — Telephone Encounter (Signed)
Patient is calling to inform Dr. [name] that his heart has been back in rhythm. He would like to know if he needs to cancel his ablation scheduled for 03/17/21. Please advise.

## 2021-02-17 NOTE — Telephone Encounter (Signed)
Spoke to the patient and advised that he does not need to be in afib during the procedure to have the ablation done. He verbalized understanding and thankful for the call back.

## 2021-02-24 ENCOUNTER — Other Ambulatory Visit: Payer: Self-pay

## 2021-02-24 ENCOUNTER — Other Ambulatory Visit: Payer: Medicare Other | Admitting: *Deleted

## 2021-02-24 DIAGNOSIS — I4819 Other persistent atrial fibrillation: Secondary | ICD-10-CM | POA: Diagnosis not present

## 2021-02-24 DIAGNOSIS — D6869 Other thrombophilia: Secondary | ICD-10-CM | POA: Diagnosis not present

## 2021-02-24 DIAGNOSIS — I4891 Unspecified atrial fibrillation: Secondary | ICD-10-CM | POA: Diagnosis not present

## 2021-02-24 LAB — BASIC METABOLIC PANEL
BUN/Creatinine Ratio: 12 (ref 10–24)
BUN: 13 mg/dL (ref 8–27)
CO2: 24 mmol/L (ref 20–29)
Calcium: 9.6 mg/dL (ref 8.6–10.2)
Chloride: 103 mmol/L (ref 96–106)
Creatinine, Ser: 1.13 mg/dL (ref 0.76–1.27)
Glucose: 87 mg/dL (ref 65–99)
Potassium: 4.2 mmol/L (ref 3.5–5.2)
Sodium: 141 mmol/L (ref 134–144)
eGFR: 67 mL/min/{1.73_m2} (ref >59)

## 2021-02-24 LAB — CBC WITH DIFFERENTIAL/PLATELET
Basophils Absolute: 0 10*3/uL (ref 0.0–0.2)
Basos: 1 %
EOS (ABSOLUTE): 0.2 10*3/uL (ref 0.0–0.4)
Eos: 4 %
Hematocrit: 41.4 % (ref 37.5–51.0)
Hemoglobin: 14.5 g/dL (ref 13.0–17.7)
Immature Grans (Abs): 0 10*3/uL (ref 0.0–0.1)
Immature Granulocytes: 0 %
Lymphocytes Absolute: 1.5 10*3/uL (ref 0.7–3.1)
Lymphs: 30 %
MCH: 31.9 pg (ref 26.6–33.0)
MCHC: 35 g/dL (ref 31.5–35.7)
MCV: 91 fL (ref 79–97)
Monocytes Absolute: 0.4 10*3/uL (ref 0.1–0.9)
Monocytes: 8 %
Neutrophils Absolute: 2.9 10*3/uL (ref 1.4–7.0)
Neutrophils: 57 %
Platelets: 243 10*3/uL (ref 150–450)
RBC: 4.54 x10E6/uL (ref 4.14–5.80)
RDW: 12.3 % (ref 11.6–15.4)
WBC: 5.1 10*3/uL (ref 3.4–10.8)

## 2021-03-03 DIAGNOSIS — H04121 Dry eye syndrome of right lacrimal gland: Secondary | ICD-10-CM | POA: Diagnosis not present

## 2021-03-03 DIAGNOSIS — H43811 Vitreous degeneration, right eye: Secondary | ICD-10-CM | POA: Diagnosis not present

## 2021-03-08 ENCOUNTER — Telehealth (HOSPITAL_COMMUNITY): Payer: Self-pay | Admitting: *Deleted

## 2021-03-08 NOTE — Telephone Encounter (Signed)
Reaching out to patient to offer assistance regarding upcoming cardiac imaging study; pt verbalizes understanding of appt date/time, parking situation and where to check in, pre-test NPO status  and verified current allergies; name and call back number provided for further questions should they arise ? ?Rabecca Birge RN Navigator Cardiac Imaging ?[facility] ?[phone] office ?[phone] cell ? ?

## 2021-03-10 ENCOUNTER — Ambulatory Visit (HOSPITAL_COMMUNITY)
Admission: RE | Admit: 2021-03-10 | Discharge: 2021-03-10 | Disposition: A | Discharge status: Home / Self Care | Payer: Medicare Other | Source: Ambulatory Visit | Attending: Internal Medicine | Admitting: Internal Medicine

## 2021-03-10 ENCOUNTER — Other Ambulatory Visit: Payer: Self-pay

## 2021-03-10 ENCOUNTER — Ambulatory Visit (HOSPITAL_COMMUNITY): Payer: Medicare Other

## 2021-03-10 DIAGNOSIS — I4891 Unspecified atrial fibrillation: Secondary | ICD-10-CM | POA: Diagnosis not present

## 2021-03-10 MED ORDER — IOHEXOL 350 MG/ML SOLN
80.0000 mL | Freq: Once | INTRAVENOUS | Status: AC | PRN
  Administered 2021-03-10: 80 mL via INTRAVENOUS

## 2021-03-15 ENCOUNTER — Other Ambulatory Visit (HOSPITAL_COMMUNITY): Payer: Medicare Other

## 2021-03-16 ENCOUNTER — Other Ambulatory Visit: Payer: Self-pay | Admitting: Primary Care

## 2021-03-16 ENCOUNTER — Telehealth: Payer: Self-pay | Admitting: Internal Medicine

## 2021-03-16 DIAGNOSIS — I4891 Unspecified atrial fibrillation: Secondary | ICD-10-CM

## 2021-03-16 NOTE — Telephone Encounter (Signed)
Pt is calling to verify what medications he can take before his procedure tomorrow

## 2021-03-16 NOTE — Pre-Procedure Instructions (Signed)
Instructed patient on the following items: Arrival time 0530 Nothing to eat or drink after midnight No meds AM of procedure Responsible person to drive you home and stay with you for 24 hrs  Have you missed any doses of anti-coagulant Xarelto- hasn't missed any doses    

## 2021-03-16 NOTE — Telephone Encounter (Signed)
According to the instructions given to pt 02/03/2021 by Saddie Benders, RN  4.   On the morning of your procedure do NOT take any medication.    Do not miss any doses of your blood thinner prior to the morning of your procedure or your procedure will need to be rescheduled.  Reviewed these instructions with pt who states understanding and had no further questions/concerns.

## 2021-03-17 ENCOUNTER — Encounter (HOSPITAL_COMMUNITY): Payer: Self-pay | Admitting: Internal Medicine

## 2021-03-17 ENCOUNTER — Other Ambulatory Visit: Payer: Self-pay

## 2021-03-17 ENCOUNTER — Ambulatory Visit (HOSPITAL_COMMUNITY)
Admission: RE | Admit: 2021-03-17 | Discharge: 2021-03-17 | Disposition: A | Discharge status: Home / Self Care | Payer: Medicare Other | Attending: Internal Medicine | Admitting: Internal Medicine

## 2021-03-17 ENCOUNTER — Ambulatory Visit (HOSPITAL_COMMUNITY): Payer: Medicare Other | Admitting: Certified Registered Nurse Anesthetist

## 2021-03-17 ENCOUNTER — Encounter (HOSPITAL_COMMUNITY)
Admission: RE | Disposition: A | Discharge status: Home / Self Care | Payer: Self-pay | Source: Home / Self Care | Attending: Internal Medicine

## 2021-03-17 DIAGNOSIS — Z8719 Personal history of other diseases of the digestive system: Secondary | ICD-10-CM | POA: Diagnosis not present

## 2021-03-17 DIAGNOSIS — Z7901 Long term (current) use of anticoagulants: Secondary | ICD-10-CM | POA: Diagnosis not present

## 2021-03-17 DIAGNOSIS — Z79899 Other long term (current) drug therapy: Secondary | ICD-10-CM | POA: Diagnosis not present

## 2021-03-17 DIAGNOSIS — N200 Calculus of kidney: Secondary | ICD-10-CM | POA: Diagnosis not present

## 2021-03-17 DIAGNOSIS — Z8546 Personal history of malignant neoplasm of prostate: Secondary | ICD-10-CM | POA: Insufficient documentation

## 2021-03-17 DIAGNOSIS — I480 Paroxysmal atrial fibrillation: Secondary | ICD-10-CM | POA: Diagnosis not present

## 2021-03-17 DIAGNOSIS — Z85828 Personal history of other malignant neoplasm of skin: Secondary | ICD-10-CM | POA: Insufficient documentation

## 2021-03-17 DIAGNOSIS — Z9079 Acquired absence of other genital organ(s): Secondary | ICD-10-CM | POA: Insufficient documentation

## 2021-03-17 HISTORY — PX: ATRIAL FIBRILLATION ABLATION: EP1191

## 2021-03-17 LAB — POCT ACTIVATED CLOTTING TIME: Activated Clotting Time: 370 seconds

## 2021-03-17 SURGERY — ATRIAL FIBRILLATION ABLATION
Anesthesia: General

## 2021-03-17 MED ORDER — HEPARIN (PORCINE) IN NACL 1000-0.9 UT/500ML-% IV SOLN
INTRAVENOUS | Status: AC
  Filled 2021-03-17: qty 500

## 2021-03-17 MED ORDER — OXYCODONE HCL 5 MG PO TABS
5.0000 mg | ORAL_TABLET | Freq: Once | ORAL | Status: DC | PRN
Start: 2021-03-17 — End: 2021-03-17

## 2021-03-17 MED ORDER — LIDOCAINE 2% (20 MG/ML) 5 ML SYRINGE
INTRAMUSCULAR | Status: DC | PRN
  Administered 2021-03-17: 60 mg via INTRAVENOUS

## 2021-03-17 MED ORDER — OXYCODONE HCL 5 MG/5ML PO SOLN
5.0000 mg | Freq: Once | ORAL | Status: DC | PRN
Start: 2021-03-17 — End: 2021-03-17

## 2021-03-17 MED ORDER — ONDANSETRON HCL 4 MG/2ML IJ SOLN: 4.0000 mg | Freq: Once | INTRAMUSCULAR | Status: DC | PRN

## 2021-03-17 MED ORDER — HYDROCODONE-ACETAMINOPHEN 5-325 MG PO TABS: 1.0000 | ORAL_TABLET | ORAL | Status: DC | PRN

## 2021-03-17 MED ORDER — DEXAMETHASONE SODIUM PHOSPHATE 10 MG/ML IJ SOLN
INTRAMUSCULAR | Status: DC | PRN
  Administered 2021-03-17: 10 mg via INTRAVENOUS

## 2021-03-17 MED ORDER — RIVAROXABAN 20 MG PO TABS: 20.0000 mg | ORAL_TABLET | Freq: Every day | ORAL | 6 refills | Status: DC

## 2021-03-17 MED ORDER — PROPOFOL 10 MG/ML IV BOLUS
INTRAVENOUS | Status: DC | PRN
  Administered 2021-03-17: 160 mg via INTRAVENOUS
  Administered 2021-03-17: 30 mg via INTRAVENOUS

## 2021-03-17 MED ORDER — HEPARIN SODIUM (PORCINE) 1000 UNIT/ML IJ SOLN
INTRAMUSCULAR | Status: AC
  Filled 2021-03-17: qty 2

## 2021-03-17 MED ORDER — ISOPROTERENOL HCL 0.2 MG/ML IJ SOLN
INTRAVENOUS | Status: DC | PRN
  Administered 2021-03-17: 20 ug/min via INTRAVENOUS

## 2021-03-17 MED ORDER — PHENYLEPHRINE HCL-NACL 10-0.9 MG/250ML-% IV SOLN
INTRAVENOUS | Status: DC | PRN
  Administered 2021-03-17: 25 ug/min via INTRAVENOUS

## 2021-03-17 MED ORDER — FENTANYL CITRATE (PF) 100 MCG/2ML IJ SOLN
INTRAMUSCULAR | Status: DC | PRN
  Administered 2021-03-17 (×2): 50 ug via INTRAVENOUS

## 2021-03-17 MED ORDER — ONDANSETRON HCL 4 MG/2ML IJ SOLN
INTRAMUSCULAR | Status: DC | PRN
  Administered 2021-03-17: 4 mg via INTRAVENOUS

## 2021-03-17 MED ORDER — ROCURONIUM BROMIDE 10 MG/ML (PF) SYRINGE
PREFILLED_SYRINGE | INTRAVENOUS | Status: DC | PRN
  Administered 2021-03-17: 20 mg via INTRAVENOUS
  Administered 2021-03-17: 60 mg via INTRAVENOUS

## 2021-03-17 MED ORDER — ISOPROTERENOL HCL 0.2 MG/ML IJ SOLN
INTRAMUSCULAR | Status: AC
  Filled 2021-03-17: qty 5

## 2021-03-17 MED ORDER — ONDANSETRON HCL 4 MG/2ML IJ SOLN: 4.0000 mg | Freq: Four times a day (QID) | INTRAMUSCULAR | Status: DC | PRN

## 2021-03-17 MED ORDER — SUGAMMADEX SODIUM 200 MG/2ML IV SOLN
INTRAVENOUS | Status: DC | PRN
  Administered 2021-03-17: 200 mg via INTRAVENOUS

## 2021-03-17 MED ORDER — HEPARIN (PORCINE) IN NACL 1000-0.9 UT/500ML-% IV SOLN
INTRAVENOUS | Status: DC | PRN
  Administered 2021-03-17: 500 mL

## 2021-03-17 MED ORDER — HEPARIN SODIUM (PORCINE) 1000 UNIT/ML IJ SOLN
INTRAMUSCULAR | Status: DC | PRN
  Administered 2021-03-17: 15000 [IU] via INTRAVENOUS
  Administered 2021-03-17: 1000 [IU] via INTRAVENOUS

## 2021-03-17 MED ORDER — SODIUM CHLORIDE 0.9 % IV SOLN: INTRAVENOUS | Status: DC

## 2021-03-17 MED ORDER — SODIUM CHLORIDE 0.9% FLUSH: 3.0000 mL | INTRAVENOUS | Status: DC | PRN

## 2021-03-17 MED ORDER — FENTANYL CITRATE (PF) 100 MCG/2ML IJ SOLN: 25.0000 ug | INTRAMUSCULAR | Status: DC | PRN

## 2021-03-17 MED ORDER — PROTAMINE SULFATE 10 MG/ML IV SOLN
INTRAVENOUS | Status: DC | PRN
  Administered 2021-03-17: 40 mg via INTRAVENOUS

## 2021-03-17 MED ORDER — ACETAMINOPHEN 325 MG PO TABS: 650.0000 mg | ORAL_TABLET | ORAL | Status: DC | PRN

## 2021-03-17 MED ORDER — FENTANYL CITRATE (PF) 250 MCG/5ML IJ SOLN: INTRAMUSCULAR | Status: DC | PRN

## 2021-03-17 MED ORDER — PANTOPRAZOLE SODIUM 40 MG PO TBEC: 40.0000 mg | DELAYED_RELEASE_TABLET | Freq: Every day | ORAL | 0 refills | Status: DC

## 2021-03-17 MED ORDER — SODIUM CHLORIDE 0.9% FLUSH: 3.0000 mL | Freq: Two times a day (BID) | INTRAVENOUS | Status: DC

## 2021-03-17 MED ORDER — SODIUM CHLORIDE 0.9 % IV SOLN: 250.0000 mL | INTRAVENOUS | Status: DC | PRN

## 2021-03-17 SURGICAL SUPPLY — 19 items
BLANKET WARM UNDERBOD FULL ACC (MISCELLANEOUS) ×2 IMPLANT
CATH MAPPNG PENTARAY F 2-6-2MM (CATHETERS) IMPLANT
CATH SMTCH THERMOCOOL SF DF (CATHETERS) ×1 IMPLANT
CATH SOUNDSTAR ECO 8FR (CATHETERS) ×1 IMPLANT
CATH WEBSTER BI DIR CS D-F CRV (CATHETERS) ×1 IMPLANT
CLOSURE PERCLOSE PROSTYLE (VASCULAR PRODUCTS) ×3 IMPLANT
COVER SWIFTLINK CONNECTOR (BAG) ×2 IMPLANT
NDL BAYLIS TRANSSEPTAL 71CM (NEEDLE) IMPLANT
NEEDLE BAYLIS TRANSSEPTAL 71CM (NEEDLE) ×2 IMPLANT
PACK EP LATEX FREE (CUSTOM PROCEDURE TRAY) ×2
PACK EP LF (CUSTOM PROCEDURE TRAY) ×1 IMPLANT
PAD PRO RADIOLUCENT 2001M-C (PAD) ×2 IMPLANT
PATCH CARTO3 (PAD) ×1 IMPLANT
PENTARAY F 2-6-2MM (CATHETERS) ×2
SHEATH PINNACLE 7F 10CM (SHEATH) ×3 IMPLANT
SHEATH PINNACLE 9F 10CM (SHEATH) ×2 IMPLANT
SHEATH PROBE COVER 6X72 (BAG) ×1 IMPLANT
SHEATH SWARTZ TS SL2 63CM 8.5F (SHEATH) ×1 IMPLANT
TUBING SMART ABLATE COOLFLOW (TUBING) ×1 IMPLANT

## 2021-03-17 NOTE — Anesthesia Postprocedure Evaluation (Signed)
Anesthesia Post Note  Patient: Derek [name]  Procedure(s) Performed: ATRIAL FIBRILLATION ABLATION     Patient location during evaluation: PACU Anesthesia Type: General Level of consciousness: awake and alert Pain management: pain level controlled Vital Signs Assessment: post-procedure vital signs reviewed and stable Respiratory status: spontaneous breathing, nonlabored ventilation, respiratory function stable and patient connected to nasal cannula oxygen Cardiovascular status: blood pressure returned to baseline and stable Postop Assessment: no apparent nausea or vomiting Anesthetic complications: no   No notable events documented.  Last Vitals:  Vitals:   03/17/21 1015 03/17/21 1030  BP: (!) 105/57 96/69  Pulse: (!) 51 (!) 55  Resp: 11 15  Temp:    SpO2: 95% 96%    Last Pain:  Vitals:   03/17/21 1010  TempSrc:   PainSc: 0-No pain                 Alfonso Carden COKER

## 2021-03-17 NOTE — Progress Notes (Signed)
Discharge instructions reviewed with pt and his wife both voice understanding.

## 2021-03-17 NOTE — Anesthesia Preprocedure Evaluation (Signed)
Anesthesia Evaluation  Patient identified by MRN, date of birth, ID band Patient awake    Reviewed: Allergy & Precautions, NPO status , Patient's Chart, lab work & pertinent test results  Airway Mallampati: II  TM Distance: >3 FB Neck ROM: Full    Dental  (+) Teeth Intact, Dental Advisory Given   Pulmonary former smoker,    breath sounds clear to auscultation       Cardiovascular  Rhythm:Regular Rate:Normal     Neuro/Psych    GI/Hepatic   Endo/Other    Renal/GU      Musculoskeletal   Abdominal   Peds  Hematology   Anesthesia Other Findings   Reproductive/Obstetrics                             Anesthesia Physical Anesthesia Plan  ASA: 3  Anesthesia Plan: General   Post-op Pain Management:    Induction: Intravenous  PONV Risk Score and Plan: Ondansetron and Dexamethasone  Airway Management Planned: Oral ETT  Additional Equipment:   Intra-op Plan:   Post-operative Plan: Extubation in OR  Informed Consent: I have reviewed the patients History and Physical, chart, labs and discussed the procedure including the risks, benefits and alternatives for the proposed anesthesia with the patient or authorized representative who has indicated his/her understanding and acceptance.     Dental advisory given  Plan Discussed with: CRNA and Anesthesiologist  Anesthesia Plan Comments:         Anesthesia Quick Evaluation

## 2021-03-17 NOTE — Progress Notes (Signed)
Dr [name] to see pt.

## 2021-03-17 NOTE — Discharge Instructions (Addendum)
Post procedure care instructions No driving for 4 days. No lifting over 5 lbs for 1 week. No vigorous or sexual activity for 1 week. You may return to work/your usual activities on 03/25/21. Keep procedure site clean & dry. If you notice increased pain, swelling, bleeding or pus, call/return!  You may shower after 24 hours, but no soaking in baths/hot tubs/pools for 1 week.    You have an appointment set up with the [facility].  Multiple studies have shown that being followed by a dedicated atrial fibrillation clinic in addition to the standard care you receive from your other physicians improves health. We believe that enrollment in the atrial fibrillation clinic will allow [place] to better care for you.   The phone number to the [facility] is [phone]. The clinic is staffed Monday through Friday from 8:30am to 5pm.  Parking Directions: The clinic is located in the Heart and Vascular Building connected to [facility]. 1)From [address] turn on to [address] and go to the 3rd entrance  (Heart and Vascular entrance) on the right. 2)Look to the right for Heart &Vascular Parking Garage. 3)A code for the entrance is required, for July is 3342.   4)Take the elevators to the 1st floor. Registration is in the room with the glass walls at the end of the hallway.  If you have any trouble parking or locating the clinic, please don't hesitate to call [phone]. Cardiac Ablation, Care After  This sheet gives you information about how to care for yourself after your procedure. Your health care provider may also give you more specific instructions. If you have problems or questions, contact your health care provider. What can I expect after the procedure? After the procedure, it is common to have: Bruising around your puncture site. Tenderness around your puncture site. Skipped heartbeats. Tiredness (fatigue).  Follow these instructions at home: Puncture  site care  Follow instructions from your health care provider about how to take care of your puncture site. Make sure you: If present, leave stitches (sutures), skin glue, or adhesive strips in place. These skin closures may need to stay in place for up to 2 weeks. If adhesive strip edges start to loosen and curl up, you may trim the loose edges. Do not remove adhesive strips completely unless your health care provider tells you to do that. If a large square bandage is present, this may be removed 24 hours after surgery.  Check your puncture site every day for signs of infection. Check for: Redness, swelling, or pain. Fluid or blood. If your puncture site starts to bleed, lie down on your back, apply firm pressure to the area, and contact your health care provider. Warmth. Pus or a bad smell. Driving Do not drive for at least 4 days after your procedure or however long your health care provider recommends. (Do not resume driving if you have previously been instructed not to drive for other health reasons.) Do not drive or use heavy machinery while taking prescription pain medicine. Activity Avoid activities that take a lot of effort for at least 7 days after your procedure. Do not lift anything that is heavier than 5 lb (4.5 kg) for one week.  No sexual activity for 1 week.  Return to your normal activities as told by your health care provider. Ask your health care provider what activities are safe for you. General instructions Take over-the-counter and prescription medicines only as told by your health care provider. Do  not use any products that contain nicotine or tobacco, such as cigarettes and e-cigarettes. If you need help quitting, ask your health care provider. You may shower after 24 hours, but Do not take baths, swim, or use a hot tub for 1 week.  Do not drink alcohol for 24 hours after your procedure. Keep all follow-up visits as told by your health care provider. This is  important. Contact a health care provider if: You have redness, mild swelling, or pain around your puncture site. You have fluid or blood coming from your puncture site that stops after applying firm pressure to the area. Your puncture site feels warm to the touch. You have pus or a bad smell coming from your puncture site. You have a fever. You have chest pain or discomfort that spreads to your neck, jaw, or arm. You are sweating a lot. You feel nauseous. You have a fast or irregular heartbeat. You have shortness of breath. You are dizzy or light-headed and feel the need to lie down. You have pain or numbness in the arm or leg closest to your puncture site. Get help right away if: Your puncture site suddenly swells. Your puncture site is bleeding and the bleeding does not stop after applying firm pressure to the area. These symptoms may represent a serious problem that is an emergency. Do not wait to see if the symptoms will go away. Get medical help right away. Call your local emergency services (911 in the [place] Do not drive yourself to the hospital. Summary After the procedure, it is normal to have bruising and tenderness at the puncture site in your groin, neck, or forearm. Check your puncture site every day for signs of infection. Get help right away if your puncture site is bleeding and the bleeding does not stop after applying firm pressure to the area. This is a medical emergency. This information is not intended to replace advice given to you by your health care provider. Make sure you discuss any questions you have with your health care provider.

## 2021-03-17 NOTE — Progress Notes (Signed)
Ambulated in hallway tol well no bleeding noted before or after ambulation  

## 2021-03-17 NOTE — Anesthesia Procedure Notes (Signed)
Procedure Name: Intubation Date/Time: 03/17/2021 7:45 AM Performed by: Valda Favia, CRNA Pre-anesthesia Checklist: Patient identified, Emergency Drugs available, Suction available and Patient being monitored Patient Re-evaluated:Patient Re-evaluated prior to induction Oxygen Delivery Method: Circle System Utilized Preoxygenation: Pre-oxygenation with 100% oxygen Induction Type: IV induction Ventilation: Mask ventilation without difficulty Laryngoscope Size: Mac and 4 Grade View: Grade I Tube type: Oral Tube size: 7.5 mm Number of attempts: 1 Airway Equipment and Method: Stylet and Oral airway Placement Confirmation: ETT inserted through vocal cords under direct vision, positive ETCO2 and breath sounds checked- equal and bilateral Secured at: 21 cm Tube secured with: Tape Dental Injury: Teeth and Oropharynx as per pre-operative assessment

## 2021-03-17 NOTE — Transfer of Care (Signed)
Immediate Anesthesia Transfer of Care Note  Patient: Derek [name]  Procedure(s) Performed: ATRIAL FIBRILLATION ABLATION  Patient Location: Cath Lab  Anesthesia Type:General  Level of Consciousness: awake, alert  and oriented  Airway & Oxygen Therapy: Patient Spontanous Breathing and Patient connected to nasal cannula oxygen  Post-op Assessment: Report given to RN and Post -op Vital signs reviewed and stable  Post vital signs: Reviewed and stable  Last Vitals:  Vitals Value Taken Time  BP 97/57 03/17/21 0928  Temp 36.4 C 03/17/21 0927  Pulse 57 03/17/21 0930  Resp 16 03/17/21 0930  SpO2 97 % 03/17/21 0930  Vitals shown include unvalidated device data.  Last Pain:  Vitals:   03/17/21 0927  TempSrc: Temporal  PainSc: 0-No pain         Complications: No notable events documented.

## 2021-03-17 NOTE — H&P (Signed)
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 male who presents today for routine electrophysiology study and ablation for atrial fibrillation.  Since last being seen in our clinic, the patient reports doing very well.  He has done well since his ablation 02/2017 until 4/22.  He has returned to persistent afib.  + fatigue and decreased exercise tolerance.  Today, he denies symptoms of palpitations, chest pain, shortness of breath,  lower extremity edema, dizziness, presyncope, or syncope.  The patient is otherwise without complaint today.       Past Medical History:  Diagnosis Date   Basal cell carcinoma 2009    back   Cancer of prostate ([place])      pT2c No Mx, Gleason 3+7=1   Chronic systolic dysfunction of left ventricle      EF 45% by echo 2/18   Diverticulitis     Kidney stones     Left rotator cuff tear 07/10/2013   Nephrolithiasis     Paroxysmal atrial fibrillation ([place])     Rheumatic fever 1952-1953         Past Surgical History:  Procedure Laterality Date   ATRIAL FIBRILLATION ABLATION N/A 02/26/2017    Procedure: Atrial Fibrillation Ablation;  Surgeon: Thompson Grayer, MD;  Location: [place] [facility];  Service: Cardiovascular;  Laterality: N/A;   bilateral inguinal hernia repair   07/10/2012    lap BIH repairs   CARDIOVERSION N/A 03/07/2017    Procedure: CARDIOVERSION;  Surgeon: Fay Records, MD;  Location: [facility];  Service: Cardiovascular;  Laterality: N/A;   COLONOSCOPY       KNEE ARTHROSCOPY W/ PARTIAL MEDIAL MENISCECTOMY   2005    Duda- arthroscopy, partial medial menisectomy-lt   ROBOT ASSISTED LAPAROSCOPIC RADICAL PROSTATECTOMY   12/19/06    robotic prostatectomy   rotator cuff surgery   2002    Gioffre-rt   SHOULDER ARTHROSCOPY WITH ROTATOR CUFF REPAIR AND SUBACROMIAL DECOMPRESSION Left 07/10/2013    Procedure: LEFT SHOULDER ARTHROSCOPY WITH ARTHROSCOPIC ROTATOR CUFF REPAIR AND SUBACROMIAL DECOMPRESSION, PARTIAL ACROMIOPLASTY WITH CORACROMIAL RELEASE;   Surgeon: Johnny Bridge, MD;  Location: [place];  Service: Orthopedics;  Laterality: Left;      ROS- all systems are reviewed and negatives except as per HPI above         Current Outpatient Medications  Medication Sig Dispense Refill   Cholecalciferol (VITAMIN D) 50 MCG (2000 UT) tablet Take 2,000 Units by mouth daily.       metoprolol tartrate (LOPRESSOR) 25 MG tablet Take 0.5 tablets (12.5 mg total) by mouth 2 (two) times daily. For heart rate. 90 tablet 0   rivaroxaban (XARELTO) 20 MG TABS tablet Take 1 tablet (20 mg total) by mouth daily with supper. 90 tablet 0   Zinc Sulfate (ZINC 15 PO) Take 15 mg by mouth daily.        No current facility-administered medications for this visit.      Physical Exam: Vitals:   03/17/21 0543  BP: 107/75  Pulse: (!) 46  Resp: 16  Temp: 98.3 F (36.8 C)  SpO2: 99%     GEN- The patient is well appearing, alert and oriented x 3 today.   Head- normocephalic, atraumatic Eyes-  Sclera clear, conjunctiva pink Ears- hearing intact Oropharynx- clear Lungs-   normal work of breathing Heart- regular rate and rhythm GI- soft Extremities- no clubbing, cyanosis, or edema      Wt Readings from Last 3 Encounters:  02/03/21 177 lb 6.4 oz (80.5 kg)  01/23/21 177 lb 9.6 oz (80.6 kg)  01/05/21 179 lb 3.2 oz (81.3 kg)       Assessment and Plan:   1. Paroxysmal atrial fibrillation Has progressed to persistent The patient has symptomatic, recurrent atrial fibrillation. he has failed medical therapy with flecainide.  He underwent ablation by me 02/2017 and did very well for several years. Chads2vasc score is at least 2.  he is anticoagulated with xarelto .   Risk, benefits, and alternatives to EP study and radiofrequency ablation for afib were again discussed in detail today. These risks include but are not limited to stroke, bleeding, vascular damage, tamponade, perforation, damage to the esophagus, lungs, and other structures,  pulmonary vein stenosis, worsening renal function, and death. The patient understands these risk and wishes to proceed.    Cardiac CT reviewed at length with the patient today.  he reports compliance with [place] without interruption.  Thompson Grayer MD, [facility] 03/17/2021 7:22 AM

## 2021-04-01 ENCOUNTER — Other Ambulatory Visit: Payer: Self-pay | Admitting: Primary Care

## 2021-04-01 DIAGNOSIS — I4891 Unspecified atrial fibrillation: Secondary | ICD-10-CM

## 2021-04-02 NOTE — Telephone Encounter (Signed)
Can you confirm with him, but if Dr. [name] stopped after his ablation, he should not need it.  Ok to refuse/decline med unless he has any questions.

## 2021-04-03 ENCOUNTER — Emergency Department (HOSPITAL_COMMUNITY)
Admission: EM | Admit: 2021-04-03 | Discharge: 2021-04-03 | Disposition: A | Discharge status: Home / Self Care | Payer: Medicare Other | Attending: Emergency Medicine | Admitting: Emergency Medicine

## 2021-04-03 ENCOUNTER — Encounter (HOSPITAL_COMMUNITY): Payer: Self-pay

## 2021-04-03 ENCOUNTER — Emergency Department (HOSPITAL_COMMUNITY): Payer: Medicare Other

## 2021-04-03 ENCOUNTER — Other Ambulatory Visit: Payer: Self-pay

## 2021-04-03 DIAGNOSIS — Z87891 Personal history of nicotine dependence: Secondary | ICD-10-CM | POA: Insufficient documentation

## 2021-04-03 DIAGNOSIS — Z7901 Long term (current) use of anticoagulants: Secondary | ICD-10-CM | POA: Insufficient documentation

## 2021-04-03 DIAGNOSIS — I4891 Unspecified atrial fibrillation: Secondary | ICD-10-CM | POA: Diagnosis not present

## 2021-04-03 DIAGNOSIS — Z8546 Personal history of malignant neoplasm of prostate: Secondary | ICD-10-CM | POA: Insufficient documentation

## 2021-04-03 DIAGNOSIS — S61215A Laceration without foreign body of left ring finger without damage to nail, initial encounter: Secondary | ICD-10-CM | POA: Insufficient documentation

## 2021-04-03 DIAGNOSIS — S61211A Laceration without foreign body of left index finger without damage to nail, initial encounter: Secondary | ICD-10-CM | POA: Insufficient documentation

## 2021-04-03 DIAGNOSIS — W293XXA Contact with powered garden and outdoor hand tools and machinery, initial encounter: Secondary | ICD-10-CM | POA: Insufficient documentation

## 2021-04-03 DIAGNOSIS — Y93H2 Activity, gardening and landscaping: Secondary | ICD-10-CM | POA: Insufficient documentation

## 2021-04-03 DIAGNOSIS — R58 Hemorrhage, not elsewhere classified: Secondary | ICD-10-CM | POA: Diagnosis not present

## 2021-04-03 DIAGNOSIS — Z85828 Personal history of other malignant neoplasm of skin: Secondary | ICD-10-CM | POA: Diagnosis not present

## 2021-04-03 DIAGNOSIS — M7989 Other specified soft tissue disorders: Secondary | ICD-10-CM | POA: Diagnosis not present

## 2021-04-03 DIAGNOSIS — S61219A Laceration without foreign body of unspecified finger without damage to nail, initial encounter: Secondary | ICD-10-CM

## 2021-04-03 DIAGNOSIS — R61 Generalized hyperhidrosis: Secondary | ICD-10-CM | POA: Diagnosis not present

## 2021-04-03 MED ORDER — LIDOCAINE HCL (PF) 1 % IJ SOLN
20.0000 mL | Freq: Once | INTRAMUSCULAR | Status: AC
  Administered 2021-04-03: 20 mL
  Filled 2021-04-03: qty 20

## 2021-04-03 MED ORDER — OXYCODONE-ACETAMINOPHEN 5-325 MG PO TABS
1.0000 | ORAL_TABLET | Freq: Once | ORAL | Status: AC
  Administered 2021-04-03: 1 via ORAL
  Filled 2021-04-03: qty 1

## 2021-04-03 NOTE — ED Triage Notes (Signed)
Pt bib [facility] from home d/t L hands laceration resulted from a gas hedge trimmer. Pt states he accidentally slipped his L hand from the handle down to the blade. Bleeding is controled with EMS. Pt on Xarelto.  EMS vitals:  124/80 98% RA

## 2021-04-03 NOTE — ED Provider Notes (Signed)
[facility] EMERGENCY DEPARTMENT Provider Note   CSN: [id number] Arrival date & time: 04/03/21  2051     History Chief Complaint  Patient presents with   Extremity Laceration    Derek [name] is a 75 y.o. male.  Presented to ER with concern for finger laceration.  Trimming shrubs with a gas powered trimmer when something slipped and he struck his left hand.  Has had significant bleeding from his fingers.  Injured all 4 fingers, denies injury to thumb.  Takes Xarelto, most recently took his evening dose around dinnertime.  Has history of A. fib.  Retired Engineer, structural.  HPI     Past Medical History:  Diagnosis Date   Basal cell carcinoma 2009   back   Cancer of prostate ([place])    pT2c No Mx, Gleason 9+6=2   Chronic systolic dysfunction of left ventricle    EF 45% by echo 2/18   Diverticulitis    Kidney stones    Left rotator cuff tear 07/10/2013   Nephrolithiasis    Paroxysmal atrial fibrillation ([place])    Rheumatic fever 1952-1953    Patient Active Problem List   Diagnosis Date Noted   Advanced directives, counseling/discussion 08/27/2018   Atrial fibrillation ([place]) 02/26/2017   History of atrial fibrillation 10/30/2016   Bilateral inguinal hernia (BIH) s/p lap repair 07/06/2012 06/24/2012   PROSTATE CANCER, HX OF 11/10/2008   CARCINOMA, BASAL CELL, HX OF 11/10/2008   RHEUMATIC FEVER, HX OF 11/10/2008   DIVERTICULITIS, HX OF 11/10/2008    Past Surgical History:  Procedure Laterality Date   ATRIAL FIBRILLATION ABLATION N/A 02/26/2017   Procedure: Atrial Fibrillation Ablation;  Surgeon: Thompson Grayer, MD;  Location: [place] [facility];  Service: Cardiovascular;  Laterality: N/A;   ATRIAL FIBRILLATION ABLATION N/A 03/17/2021   Procedure: ATRIAL FIBRILLATION ABLATION;  Surgeon: Thompson Grayer, MD;  Location: [place] [facility];  Service: Cardiovascular;  Laterality: N/A;   bilateral inguinal hernia repair  07/10/2012   lap BIH repairs   CARDIOVERSION  N/A 03/07/2017   Procedure: CARDIOVERSION;  Surgeon: Fay Records, MD;  Location: [facility] ENDOSCOPY;  Service: Cardiovascular;  Laterality: N/A;   COLONOSCOPY     KNEE ARTHROSCOPY W/ PARTIAL MEDIAL MENISCECTOMY  2005   Duda- arthroscopy, partial medial menisectomy-lt   ROBOT ASSISTED LAPAROSCOPIC RADICAL PROSTATECTOMY  12/19/06   robotic prostatectomy   rotator cuff surgery  2002   Gioffre-rt   SHOULDER ARTHROSCOPY WITH ROTATOR CUFF REPAIR AND SUBACROMIAL DECOMPRESSION Left 07/10/2013   Procedure: LEFT SHOULDER ARTHROSCOPY WITH ARTHROSCOPIC ROTATOR CUFF REPAIR AND SUBACROMIAL DECOMPRESSION, PARTIAL ACROMIOPLASTY WITH CORACROMIAL RELEASE;  Surgeon: Johnny Bridge, MD;  Location: [place];  Service: Orthopedics;  Laterality: Left;       Family History  Problem Relation Age of Onset   Breast cancer Mother    Cancer Brother 16       brain tumor   Heart disease Other        fam hx   Arthritis Other        other relative   Prostate cancer Other        nephew   Lung cancer Other 47       nephew   Arrhythmia Father     Social History   Tobacco Use   Smoking status: Former    Pack years: 0.00    Types: Cigarettes    Quit date: 09/25/1971    Years since quitting: 49.5   Smokeless tobacco: Never  Vaping Use   Vaping Use: Never used  Substance Use Topics   Alcohol use: No   Drug use: No    Home Medications Prior to Admission medications   Medication Sig Start Date End Date Taking? Authorizing Provider  Artificial Tear Solution (GENTEAL TEARS) 0.1-0.2-0.3 % SOLN Place 1-2 drops into both eyes 3 (three) times daily as needed (dry/irritated eyes).    [provider]  Cholecalciferol (VITAMIN D) 50 MCG (2000 UT) tablet Take 2,000 Units by mouth in the morning.    [provider]  pantoprazole (PROTONIX) 40 MG tablet Take 1 tablet (40 mg total) by mouth daily. 03/17/21 05/01/21  Allred, Jeneen Rinks, MD  rivaroxaban (XARELTO) 20 MG TABS tablet Take 1 tablet (20  mg total) by mouth daily with supper. 03/17/21   Baldwin Jamaica, PA-C  Zinc Sulfate (ZINC 15 PO) Take 15 mg by mouth every evening.    [provider]    Allergies    Flecainide  Review of Systems   Review of Systems  All other systems reviewed and are negative.  Physical Exam Updated Vital Signs BP 129/75   Pulse 63   Temp 97.9 F (36.6 C) (Oral)   Resp 19   SpO2 96%   Physical Exam Constitutional:      Appearance: Normal appearance.  HENT:     Head: Normocephalic and atraumatic.     Nose: Nose normal.  Eyes:     Extraocular Movements: Extraocular movements intact.     Pupils: Pupils are equal, round, and reactive to light.  Cardiovascular:     Rate and Rhythm: Normal rate.     Pulses: Normal pulses.  Pulmonary:     Effort: Pulmonary effort is normal. No respiratory distress.  Musculoskeletal:     Cervical back: Neck supple. No rigidity.     Comments: L hand: Thumb - no injury Index finger -1 cm laceration over the distal volar aspect of finger, no nailbed involvement, small venous ooze noted, distal sensation intact, normal flexion and extension Middle finger - small abrasion to distal volar aspect, no active bleed Fourth finger - 3 cm laceration  Skin:    Comments: Lacerations as above  Neurological:     General: No focal deficit present.     Mental Status: He is alert.     Comments: L hand: Sensation intact in all fingers, motor intact in all fingers    ED Results / Procedures / Treatments   Labs (all labs ordered are listed, but only abnormal results are displayed) Labs Reviewed - No data to display  EKG None  Radiology DG Hand Complete Left  Result Date: 04/03/2021 CLINICAL DATA:  Multiple finger lacerations EXAM: LEFT HAND - COMPLETE 3+ VIEW COMPARISON:  None. FINDINGS: Soft tissue swelling and cutaneous skin defects involving the second and fourth digits, no radiopaque foreign body. There is no evidence of fracture or dislocation. There  is no evidence of arthropathy or other focal bone abnormality. IMPRESSION: Soft tissue swelling and cutaneous skin defects involving the second and fourth digits without acute fracture or radiopaque foreign body visualized. Electronically Signed   By: Dahlia Bailiff MD   On: 04/03/2021 22:05    Procedures .Marland KitchenLaceration Repair  Date/Time: 04/04/2021 3:04 PM Performed by: Lucrezia Starch, MD Authorized by: Lucrezia Starch, MD   Consent:    Consent obtained:  Verbal   Consent given by:  Patient   Risks, benefits, and alternatives were discussed: yes     Risks discussed:  Infection, need for additional repair, nerve damage, poor wound healing, poor cosmetic result, pain, retained foreign body, tendon damage and vascular damage   Alternatives discussed:  No treatment Universal protocol:    Immediately prior to procedure, a time out was called: yes     Patient identity confirmed:  Verbally with patient Anesthesia:    Anesthesia method:  Nerve block   Block location:  Index finger, ring finger   Block needle gauge:  25 G   Block anesthetic:  Lidocaine 1% w/o epi   Block technique:  Digital block   Block injection procedure:  Anatomic landmarks identified, introduced needle, anatomic landmarks palpated and negative aspiration for blood   Block outcome:  Anesthesia achieved Laceration details:    Location: left index finger and ring finger.   Length (cm):  4 (1cm on index and 3cm on ring) Pre-procedure details:    Preparation:  Patient was prepped and draped in usual sterile fashion Exploration:    Hemostasis achieved with:  Direct pressure   Contaminated: no   Treatment:    Area cleansed with:  Povidone-iodine and saline   Amount of cleaning:  Extensive   Irrigation solution:  Sterile saline   Irrigation method:  Syringe Skin repair:    Repair method:  Sutures   Suture size:  5-0   Suture material:  Nylon   Suture technique:  Simple interrupted   Number of sutures:   7 Approximation:    Approximation:  Close Repair type:    Repair type:  Simple Post-procedure details:    Dressing: xeroform gauze and tube roll gauze.   Procedure completion:  Tolerated well, no immediate complications   Medications Ordered in ED Medications  oxyCODONE-acetaminophen (PERCOCET/ROXICET) 5-325 MG per tablet 1 tablet (1 tablet Oral Given 04/03/21 2111)  lidocaine (PF) (XYLOCAINE) 1 % injection 20 mL (20 mLs Infiltration Given 04/03/21 2252)    ED Course  I have reviewed the triage vital signs and the nursing notes.  Pertinent labs & imaging results that were available during my care of the patient were reviewed by me and considered in my medical decision making (see chart for details).    MDM Rules/Calculators/A&P                          75 year old male presenting to ER with concern for multiple finger lacerations.  On careful inspection of his fingers, the laceration to the third and fifth digit were relatively superficial, not requiring repair.  The ring finger had a large approximately 3 cm U-shaped laceration, no nailbed involvement.  Index finger had a proximately 1 cm laceration, no nailbed involvement.  All fingers were neurovascularly intact, tendon function intact.  Performed thorough irrigation with dilute Betadine and sterile saline.  Closed wounds.  Recommended follow-up with hand outpatient for wound recheck and eventual suture removal.  Counseled on wound care.   After the discussed management above, the patient was determined to be safe for discharge.  The patient was in agreement with this plan and all questions regarding their care were answered.  ED return precautions were discussed and the patient will return to the ED with any significant worsening of condition.  Final Clinical Impression(s) / ED Diagnoses Final diagnoses:  Laceration of finger of left hand without damage to nail, foreign body presence unspecified, unspecified finger, initial encounter     Rx / DC Orders ED Discharge Orders     None  Lucrezia Starch, MD 04/04/21 [phone]

## 2021-04-03 NOTE — Discharge Instructions (Addendum)
Please call Dr. Noralyn Pick office tomorrow morning to request close follow-up appointment for wound recheck.  You will need your sutures in place for likely 10 to 14 days but I would recommend seeing the hand specialist sooner for wound recheck.  Recommend changing dressing daily or every other day.  Use base of petroleum or Xeroform gauze followed by dry tube, roll gauze.  Keep clean and dry.  Recommend limiting weightbearing on this hand while you are lacerations heal.  Come back to ER if you develop uncontrolled bleeding, redness, purulence, fever or other new concerning symptom.

## 2021-04-10 DIAGNOSIS — S61211A Laceration without foreign body of left index finger without damage to nail, initial encounter: Secondary | ICD-10-CM | POA: Diagnosis not present

## 2021-04-10 DIAGNOSIS — S61215A Laceration without foreign body of left ring finger without damage to nail, initial encounter: Secondary | ICD-10-CM | POA: Diagnosis not present

## 2021-04-13 ENCOUNTER — Ambulatory Visit (HOSPITAL_COMMUNITY)
Admission: RE | Admit: 2021-04-13 | Discharge: 2021-04-13 | Disposition: A | Discharge status: Home / Self Care | Payer: Medicare Other | Source: Ambulatory Visit | Attending: Nurse Practitioner | Admitting: Nurse Practitioner

## 2021-04-13 ENCOUNTER — Other Ambulatory Visit: Payer: Self-pay

## 2021-04-13 ENCOUNTER — Encounter (HOSPITAL_COMMUNITY): Payer: Self-pay | Admitting: Nurse Practitioner

## 2021-04-13 VITALS — BP 114/64 | HR 63 | Ht 70.0 in | Wt 173.4 lb

## 2021-04-13 DIAGNOSIS — Z79899 Other long term (current) drug therapy: Secondary | ICD-10-CM | POA: Insufficient documentation

## 2021-04-13 DIAGNOSIS — Z87891 Personal history of nicotine dependence: Secondary | ICD-10-CM | POA: Diagnosis not present

## 2021-04-13 DIAGNOSIS — D6869 Other thrombophilia: Secondary | ICD-10-CM | POA: Diagnosis not present

## 2021-04-13 DIAGNOSIS — Z8249 Family history of ischemic heart disease and other diseases of the circulatory system: Secondary | ICD-10-CM | POA: Diagnosis not present

## 2021-04-13 DIAGNOSIS — I480 Paroxysmal atrial fibrillation: Secondary | ICD-10-CM | POA: Diagnosis not present

## 2021-04-13 DIAGNOSIS — Z7901 Long term (current) use of anticoagulants: Secondary | ICD-10-CM | POA: Diagnosis not present

## 2021-04-13 DIAGNOSIS — I4891 Unspecified atrial fibrillation: Secondary | ICD-10-CM | POA: Diagnosis not present

## 2021-04-13 NOTE — Progress Notes (Signed)
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 He had a chainsaw accident where he cut the tips pf his 3 rt finger tips and because of bleeding proceeded to the ER. Strips form EMS read as afib but it appears to me to be a wandering pacemaker going between a junctional and SR on the strips. EKGtoday shows SR with PAC's. It was a regular rhythm. His fingertips are healing nicely and he has not noted any afib. No swallowing or groin issues. Continues on xarelto with a CHA2DS2VASc  score of 2.   Today, he denies symptoms of palpitations, chest pain, shortness of breath, orthopnea, PND, lower extremity edema, dizziness, presyncope, syncope, or neurologic sequela. The patient is tolerating medications without difficulties and is otherwise without complaint today.   Past Medical History:  Diagnosis Date   Basal cell carcinoma 2009   back   Cancer of prostate ([place])    pT2c No Mx, Gleason 7+8=4   Chronic systolic dysfunction of left ventricle    EF 45% by echo 2/18   Diverticulitis    Kidney stones    Left rotator cuff tear 07/10/2013   Nephrolithiasis    Paroxysmal atrial fibrillation ([place])    Rheumatic fever 1952-1953   Past Surgical History:  Procedure Laterality Date   ATRIAL FIBRILLATION ABLATION N/A 02/26/2017   Procedure: Atrial Fibrillation Ablation;  Surgeon: Thompson Grayer, MD;  Location: [place] [facility];  Service: Cardiovascular;  Laterality: N/A;   ATRIAL FIBRILLATION ABLATION N/A 03/17/2021   Procedure: ATRIAL FIBRILLATION ABLATION;  Surgeon: Thompson Grayer, MD;  Location: [place] [facility];  Service: Cardiovascular;  Laterality: N/A;   bilateral inguinal hernia repair  07/10/2012   lap BIH repairs   CARDIOVERSION N/A 03/07/2017   Procedure: CARDIOVERSION;  Surgeon: Fay Records, MD;  Location: [facility] END[facility]COPY;  Service: Cardiovascular;  Laterality: N/A;    COLONOSCOPY     KNEE ARTHROSCOPY W/ PARTIAL MEDIAL MENISCECTOMY  2005   Duda- arthroscopy, partial medial menisectomy-lt   ROBOT ASSISTED LAPAROSCOPIC RADICAL PROSTATECTOMY  12/19/06   robotic prostatectomy   rotator cuff surgery  2002   Gioffre-rt   SHOULDER ARTHROSCOPY WITH ROTATOR CUFF REPAIR AND SUBACROMIAL DECOMPRESSION Left 07/10/2013   Procedure: LEFT SHOULDER ARTHROSCOPY WITH ARTHROSCOPIC ROTATOR CUFF REPAIR AND SUBACROMIAL DECOMPRESSION, PARTIAL ACROMIOPLASTY WITH CORACROMIAL RELEASE;  Surgeon: Johnny Bridge, MD;  Location: [place];  Service: Orthopedics;  Laterality: Left;    Current Outpatient Medications  Medication Sig Dispense Refill   Artificial Tear Solution (GENTEAL TEARS) 0.1-0.2-0.3 % SOLN Place 1-2 drops into both eyes 3 (three) times daily as needed (dry/irritated eyes).     Cholecalciferol (VITAMIN D) 50 MCG (2000 UT) tablet Take 2,000 Units by mouth in the morning.     pantoprazole (PROTONIX) 40 MG tablet Take 1 tablet (40 mg total) by mouth daily. 45 tablet 0   rivaroxaban (XARELTO) 20 MG TABS tablet Take 1 tablet (20 mg total) by mouth daily with supper. 30 tablet 6   Zinc Sulfate (ZINC 15 PO) Take 15 mg by mouth every evening.     cephALEXin (KEFLEX) 500 MG capsule Take 500 mg by mouth 4 (four) times daily.     No current facility-administered medications for this encounter.    Allergies  Allergen Reactions   Flecainide Other (See Comments)    Dizziness and fatigue  Social History   Socioeconomic History   Marital status: Married    Spouse name: Not on file   Number of children: Not on file   Years of education: Not on file   Highest education level: Not on file  Occupational History   Occupation: retired Licensed conveyancer)    Employer: [employer]  Tobacco Use   Smoking status: Former    Types: Cigarettes    Quit date: 09/25/1971    Years since quitting: 49.5   Smokeless tobacco: Never  Vaping Use   Vaping Use: Never  used  Substance and Sexual Activity   Alcohol use: No   Drug use: No   Sexual activity: Not on file  Other Topics Concern   Not on file  Social History Narrative   Regular exercise: yes - runner 5d/wk, former marathon runner   Diet: no red meat, good water, fruits/vegetables daily    Social Determinants of Health   Financial Resource Strain: Not on file  Food Insecurity: Not on file  Transportation Needs: Not on file  Physical Activity: Not on file  Stress: Not on file  Social Connections: Not on file  Intimate Partner Violence: Not on file    Family History  Problem Relation Age of Onset   Breast cancer Mother    Cancer Brother 21       brain tumor   Heart disease Other        fam hx   Arthritis Other        other relative   Prostate cancer Other        nephew   Lung cancer Other 5       nephew   Arrhythmia Father     ROS- All systems are reviewed and negative except as per the HPI above  Physical Exam: Vitals:   04/13/21 0830  BP: 114/64  Pulse: 63  Weight: 78.7 kg  Height: 5\' 10"  (1.778 m)   Wt Readings from Last 3 Encounters:  04/13/21 78.7 kg  03/17/21 79.4 kg  02/03/21 80.5 kg    Labs: Lab Results  Component Value Date   NA 141 02/24/2021   K 4.2 02/24/2021   CL 103 02/24/2021   CO2 24 02/24/2021   GLUCOSE 87 02/24/2021   BUN 13 02/24/2021   CREATININE 1.13 02/24/2021   CALCIUM 9.6 02/24/2021   Lab Results  Component Value Date   INR 1.1 11/16/2016   Lab Results  Component Value Date   CHOL 186 12/06/2020   HDL 61.60 12/06/2020   LDLCALC 114 (H) 12/06/2020   TRIG 50.0 12/06/2020     GEN- The patient is well appearing, alert and oriented x 3 today.   Head- normocephalic, atraumatic Eyes-  Sclera clear, conjunctiva pink Ears- hearing intact Oropharynx- clear Neck- supple, no JVP Lymph- no cervical lymphadenopathy Lungs- Clear to ausculation bilaterally, normal work of breathing Heart- Regular rate and rhythm, no murmurs, rubs  or gallops, PMI not laterally displaced GI- soft, NT, ND, + BS Extremities- no clubbing, cyanosis, or edema MS- no significant deformity or atrophy Skin- no rash or lesion Psych- euthymic mood, full affect Neuro- strength and sensation are intact  EKG-SR with PAC's at 63 bpm, pr int 150 ms, qrs int 74 ms, qtc 423 ms     Assessment and Plan:  1. Afib  One month s/p 2nd ablation Maintaining  SR  Is not on any rhythm or rate control meds  2. CHA2DS2VASc  score of 2(age)  Continue xarelto 20 mg daily   F/u with Dr. [name] 9/21  Geroge Baseman. Mafalda Mcginniss, [facility] [address] [place], [place] [address] [phone]

## 2021-04-17 DIAGNOSIS — S61211D Laceration without foreign body of left index finger without damage to nail, subsequent encounter: Secondary | ICD-10-CM | POA: Diagnosis not present

## 2021-04-17 DIAGNOSIS — S61215D Laceration without foreign body of left ring finger without damage to nail, subsequent encounter: Secondary | ICD-10-CM | POA: Diagnosis not present

## 2021-06-14 ENCOUNTER — Ambulatory Visit (INDEPENDENT_AMBULATORY_CARE_PROVIDER_SITE_OTHER): Payer: Medicare Other | Admitting: Internal Medicine

## 2021-06-14 ENCOUNTER — Other Ambulatory Visit: Payer: Self-pay

## 2021-06-14 VITALS — BP 100/62 | HR 64 | Ht 71.0 in | Wt 173.2 lb

## 2021-06-14 DIAGNOSIS — I480 Paroxysmal atrial fibrillation: Secondary | ICD-10-CM

## 2021-06-14 MED ORDER — RIVAROXABAN 20 MG PO TABS: 20.0000 mg | ORAL_TABLET | Freq: Every day | ORAL | 6 refills | Status: DC

## 2021-06-14 NOTE — Patient Instructions (Addendum)
Medication Instructions:  Your physician recommends that you continue on your current medications as directed. Please refer to the Current Medication list given to you today.  Labwork: None ordered.  Testing/Procedures: None ordered.  Follow-Up: Your physician wants you to follow-up in: 6 months with the Afib Clinic. They will contact you to schedule.    Any Other Special Instructions Will Be Listed Below (If Applicable).  If you need a refill on your cardiac medications before your next appointment, please call your pharmacy.

## 2021-06-14 NOTE — Progress Notes (Signed)
PCP: [name], MD    Derek [name] is a 75 y.o. male who presents today for routine electrophysiology followup.  Since his recent afib ablation, the patient reports doing very well.  he denies procedure related complications and is pleased with the results of the procedure.  Today, he denies symptoms of palpitations, chest pain, shortness of breath,  lower extremity edema, dizziness, presyncope, or syncope.  The patient is otherwise without complaint today.   Past Medical History:  Diagnosis Date   Basal cell carcinoma 2009   back   Cancer of prostate ([place])    pT2c No Mx, Gleason 6+3=8   Chronic systolic dysfunction of left ventricle    EF 45% by echo 2/18   Diverticulitis    Kidney stones    Left rotator cuff tear 07/10/2013   Nephrolithiasis    Paroxysmal atrial fibrillation ([place])    Rheumatic fever 1952-1953   Past Surgical History:  Procedure Laterality Date   ATRIAL FIBRILLATION ABLATION N/A 02/26/2017   Procedure: Atrial Fibrillation Ablation;  Surgeon: Thompson Grayer, MD;  Location: [facility];  Service: Cardiovascular;  Laterality: N/A;   ATRIAL FIBRILLATION ABLATION N/A 03/17/2021   Procedure: ATRIAL FIBRILLATION ABLATION;  Surgeon: Thompson Grayer, MD;  Location: [facility];  Service: Cardiovascular;  Laterality: N/A;   bilateral inguinal hernia repair  07/10/2012   lap BIH repairs   CARDIOVERSION N/A 03/07/2017   Procedure: CARDIOVERSION;  Surgeon: Fay Records, MD;  Location: [facility];  Service: Cardiovascular;  Laterality: N/A;   COLONOSCOPY     KNEE ARTHROSCOPY W/ PARTIAL MEDIAL MENISCECTOMY  2005   Duda- arthroscopy, partial medial menisectomy-lt   ROBOT ASSISTED LAPAROSCOPIC RADICAL PROSTATECTOMY  12/19/06   robotic prostatectomy   rotator cuff surgery  2002   Gioffre-rt   SHOULDER ARTHROSCOPY WITH ROTATOR CUFF REPAIR AND SUBACROMIAL DECOMPRESSION Left 07/10/2013   Procedure: LEFT SHOULDER ARTHROSCOPY WITH ARTHROSCOPIC ROTATOR CUFF  REPAIR AND SUBACROMIAL DECOMPRESSION, PARTIAL ACROMIOPLASTY WITH CORACROMIAL RELEASE;  Surgeon: Johnny Bridge, MD;  Location: [facility];  Service: Orthopedics;  Laterality: Left;    ROS- all systems are personally reviewed and negatives except as per HPI above  Current Outpatient Medications  Medication Sig Dispense Refill   Artificial Tear Solution (GENTEAL TEARS) 0.1-0.2-0.3 % SOLN Place 1-2 drops into both eyes 3 (three) times daily as needed (dry/irritated eyes).     cephALEXin (KEFLEX) 500 MG capsule Take 500 mg by mouth 4 (four) times daily.     Cholecalciferol (VITAMIN D) 50 MCG (2000 UT) tablet Take 2,000 Units by mouth in the morning.     pantoprazole (PROTONIX) 40 MG tablet Take 1 tablet (40 mg total) by mouth daily. 45 tablet 0   rivaroxaban (XARELTO) 20 MG TABS tablet Take 1 tablet (20 mg total) by mouth daily with supper. 30 tablet 6   Zinc Sulfate (ZINC 15 PO) Take 15 mg by mouth every evening.     No current facility-administered medications for this visit.    Physical Exam: Vitals:   06/14/21 0944  BP: 100/62  Pulse: 64  SpO2: 96%  Weight: 173 lb 3.2 oz (78.6 kg)  Height: 5\' 11"  (1.803 m)    GEN- The patient is well appearing, alert and oriented x 3 today.   Head- normocephalic, atraumatic Eyes-  Sclera clear, conjunctiva pink Ears- hearing intact Oropharynx- clear Lungs- Clear to ausculation bilaterally, normal work of breathing Heart- Regular rate and rhythm, no murmurs, rubs or gallops, PMI not  laterally displaced GI- soft, NT, ND, + BS Extremities- no clubbing, cyanosis, or edema  EKG tracing ordered today is personally reviewed and shows sinus with PACs  Assessment and Plan:  1. Paroxysmal atrial fibrillation Doing well s/p ablation chads2vasc score is 2.  continue xarelto He would be a good candidate for REACT AF trial if this becomes an option    Return to afib clinic in 6 months  Thompson Grayer MD, [facility] 06/14/2021 10:19  AM

## 2021-06-19 ENCOUNTER — Other Ambulatory Visit (HOSPITAL_COMMUNITY): Payer: Self-pay | Admitting: *Deleted

## 2021-06-19 MED ORDER — METOPROLOL TARTRATE 25 MG PO TABS: ORAL_TABLET | ORAL | 1 refills | Status: DC

## 2021-06-21 DIAGNOSIS — Z85828 Personal history of other malignant neoplasm of skin: Secondary | ICD-10-CM | POA: Diagnosis not present

## 2021-06-21 DIAGNOSIS — L821 Other seborrheic keratosis: Secondary | ICD-10-CM | POA: Diagnosis not present

## 2021-06-21 DIAGNOSIS — L720 Epidermal cyst: Secondary | ICD-10-CM | POA: Diagnosis not present

## 2021-06-21 DIAGNOSIS — D2262 Melanocytic nevi of left upper limb, including shoulder: Secondary | ICD-10-CM | POA: Diagnosis not present

## 2021-06-21 DIAGNOSIS — D1801 Hemangioma of skin and subcutaneous tissue: Secondary | ICD-10-CM | POA: Diagnosis not present

## 2021-06-30 ENCOUNTER — Ambulatory Visit: Payer: Medicare Other

## 2021-07-20 ENCOUNTER — Telehealth: Payer: Self-pay | Admitting: Family Medicine

## 2021-07-20 DIAGNOSIS — U071 COVID-19: Secondary | ICD-10-CM | POA: Diagnosis not present

## 2021-07-20 MED ORDER — MOLNUPIRAVIR EUA 200MG CAPSULE: 4.0000 | ORAL_CAPSULE | Freq: Two times a day (BID) | ORAL | 0 refills | Status: AC

## 2021-07-20 NOTE — Telephone Encounter (Addendum)
Spoke with Mr. Gartrell.  He states he and his wife had a positive home test today.  Symptoms started Monday afternoon with mild sinus headache and cough with occasional wheeze. Patient has been taking Zinc and Vitamin D3 since Covid started and he states he hasn't missed a day.  Since starting symptoms, patient has been taking Dayquil and Cold-Eze.  Will forward to Dr. Lorelei Pont to see if he will send in Rx for antiviral or if patient needs a virtual visit.  [facility] [place].  Please advise.

## 2021-07-20 NOTE — Telephone Encounter (Signed)
Mr. Brunty notified as instructed by telephone.  Patient states understanding.

## 2021-07-20 NOTE — Telephone Encounter (Signed)
Pt called in stated he tested positive for Covid . Cough,headache runny nose . Would like a call back as of next step for medication [phone]

## 2021-07-20 NOTE — Telephone Encounter (Signed)
I know [place] really well.  I am comfortable sending in an anti-viral.    ICD-10-CM   1. COVID-19  U07.1 molnupiravir EUA (LAGEVRIO) 200 mg CAPS capsule      Meds ordered this encounter  Medications   molnupiravir EUA (LAGEVRIO) 200 mg CAPS capsule    Sig: Take 4 capsules (800 mg total) by mouth 2 (two) times daily for 5 days.    Dispense:  40 capsule    Refill:  0

## 2021-08-02 NOTE — Telephone Encounter (Signed)
Mr. Vanessen notified as instructed by telephone.  Patient states understanding.

## 2021-08-02 NOTE — Telephone Encounter (Signed)
Left message for Mr. Derek [name] to return my call.

## 2021-08-02 NOTE — Telephone Encounter (Signed)
[facility] - Client TELEPHONE ADVICE RECORD AccessNurse Patient Name: Derek [name] [place] Gender: Male DOB: 07-15-1946 Age: 75 Y 56 M 29 D Return Phone Number: [phone] (Primary) Address: City/ State/ Zip: [place]  [address] Client [facility] - Client Client Site [facility] - Day Physician Copland, Frederico Hamman - MD Contact Type Call Who Is Calling Patient / Member / Family / Caregiver Call Type Triage / Clinical Relationship To Patient Self Return Phone Number [phone] (Primary) Chief Complaint Cough Reason for Call Symptomatic / Request for [name] states he tested positive for covid 07/20/21 was given antiviral medication to help. States the congestion is in his lungs and chest. No cough but when he tries to expel the congestion in his lungs it is thick and he states it is yellow. Is concerned about pneumonia. When he coughs it is raspy. Translation No Nurse Assessment Nurse: Raphael Gibney, RN, Vanita Ingles Date/Time (Eastern Time): 08/02/2021 11:47:17 AM Confirm and document reason for call. If symptomatic, describe symptoms. ---Caller states he tested positive for COVID on 10/27 and he was prescribed antiviral which he completed. No chills, fever. Still has a lot of sinus congestion and has been taking decongestant. Mucus is in chest and when he tries to cough it up, it is clear and thick and has occasional yellow mucus. No cough. Does the patient have any new or worsening symptoms? ---Yes Will a triage be completed? ---Yes Related visit to physician within the last 2 weeks? ---Yes Does the PT have any chronic conditions? (i.e. diabetes, asthma, this includes High risk factors for pregnancy, etc.) ---Yes List chronic conditions. ---cardiac ablation Is this a behavioral health or substance abuse call? ---No Guidelines Guideline Title Affirmed Question Affirmed  Notes Nurse Date/Time ([place] Time) COVID-19 - Diagnosed or Suspected [1] COVID-19 diagnosed by positive lab test (e.g., PCR, rapid Raphael Gibney, RN, Vera 08/02/2021 11:52:34 AM PLEASE NOTE: All timestamps contained within this report are represented as [place] Standard Time. CONFIDENTIALTY NOTICE: This fax transmission is intended only for the addressee. It contains information that is legally privileged, confidential or otherwise protected from use or disclosure. If you are not the intended recipient, you are strictly prohibited from reviewing, disclosing, copying using or disseminating any of this information or taking any action in reliance on or regarding this information. If you have received this fax in error, please notify us immediately by telephone so that we can arrange for its return to [place]. Phone: [phone], Toll-Free: [phone], Fax: [phone] Page: 2 of 2 Call Id: [id number] Guidelines Guideline Title Affirmed Question Affirmed Notes Nurse Date/Time Eilene Ghazi Time) self-test kit) AND [2] mild symptoms (e.g., cough, fever, others) AND [3] no complications or SOB Disp. Time Eilene Ghazi Time) Disposition Final User 08/02/2021 12:00:29 PM Call PCP within 24 Hours Yes Raphael Gibney, RN, Vanita Ingles Disposition Overriden: Home Care Override Reason: Patient's symptoms need a higher level of care Caller Disagree/Comply Disagree Caller Understands Yes PreDisposition Call Doctor Care Advice Given Per Guideline GENERAL CARE ADVICE FOR COVID-19 SYMPTOMS: CALL BACK IF: * You become worse CARE ADVICE given per COVID-19 - DIAGNOSED OR SUSPECTED (Adult) guideline. CALL PCP WITHIN 24 HOURS: * You need to discuss this with your doctor (or NP/PA) within the next 24 hours. Comments User: Dannielle Burn, RN Date/Time Eilene Ghazi Time): 08/02/2021 11:59:12 AM pt does not want to make appt but would like call back regarding if there any medication that can be prescribed to help his congestion. please  call pt back User: Dannielle Burn, RN Date/Time Eilene Ghazi Time): 08/02/2021 12:00:23 PM triage outcome upgraded to call physician within 24 hrs as pt has a lot of mucus in his chest

## 2021-08-02 NOTE — Telephone Encounter (Signed)
See Dr Lillie Fragmin note below this access nurse note and Butch Penny CMA spoke with pt since access nurse note done. Sending to [facility] CMA.

## 2021-08-02 NOTE — Telephone Encounter (Addendum)
Pt called in stating congestion has gotten into chest and lungs and is there something that can be prescribed to break up congestion in chest. Pt is requesting a call back

## 2021-08-02 NOTE — Telephone Encounter (Signed)
This is tough with Covid, and some people cough for weeks to months.  If not better next week, I want to recheck him in the office face-to-face.   Take Guaifenesin (400mg ), - not extended  take 1 1/2 tabs by mouth AM, NOON, and evening.  This is above the dose on the packaging.   Get GUAIFENESIN by  going to any of the local pharmacies.   DO NOT GET MUCINEX (Timed Release Guaifenesin)

## 2021-08-21 DIAGNOSIS — Z23 Encounter for immunization: Secondary | ICD-10-CM | POA: Diagnosis not present

## 2021-12-06 NOTE — Progress Notes (Signed)
? ?Subjective:  ? Derek [name] is a 76 y.o. male who presents for Medicare Annual/Subsequent preventive examination. ? ?I connected with Cari Caraway today by telephone and verified that I am speaking with the correct person using two identifiers. ?Location patient: home ?Location provider: work ?Persons participating in the virtual visit: patient, nurse.  ?  ?I discussed the limitations, risks, security and privacy concerns of performing an evaluation and management service by telephone and the availability of in person appointments. I also discussed with the patient that there may be a patient responsible charge related to this service. The patient expressed understanding and verbally consented to this telephonic visit.  ?  ?Interactive audio and video telecommunications were attempted between this provider and patient, however failed, due to patient having technical difficulties OR patient did not have access to video capability.  We continued and completed visit with audio only. ? ?Some vital signs may be absent or patient reported.  ? ?Time Spent with patient on telephone encounter: 20 minutes ? ?Review of Systems    ? ?Cardiac Risk Factors include: advanced age (>21mn, >>79women) ? ?   ?Objective:  ?  ?Today's Vitals  ? 12/07/21 1124  ?Weight: 174 lb (78.9 kg)  ?Height: '5\' 11"'$  (1.803 m)  ? ?Body mass index is 24.27 kg/m?. ? ?Advanced Directives 12/07/2021 03/17/2021 12/06/2020 09/07/2019 08/27/2018 07/05/2017 03/07/2017  ?Does Patient Have a Medical Advance Directive? Yes Yes Yes Yes Yes Yes Yes  ?Type of AParamedicof AUteLiving will HLakeviewLiving will HEast ConemaughLiving will HWillow SpringsLiving will HLanderLiving will HTenaflyLiving will Living will;Healthcare Power of Attorney  ?Does patient want to make changes to medical advance directive? - No - Patient declined - - - - -  ?Copy  of [place] Chart? Yes - validated most recent copy scanned in chart (See row information) No - copy requested No - copy requested No - copy requested Yes - validated most recent copy scanned in chart (See row information) No - copy requested No - copy requested  ? ? ?Current Medications (verified) ?Outpatient Encounter Medications as of 12/07/2021  ?Medication Sig  ? Artificial Tear Solution (GENTEAL TEARS) 0.1-0.2-0.3 % SOLN Place 1-2 drops into both eyes 3 (three) times daily as needed (dry/irritated eyes).  ? Cholecalciferol (VITAMIN D) 50 MCG (2000 UT) tablet Take 2,000 Units by mouth in the morning.  ? rivaroxaban (XARELTO) 20 MG TABS tablet Take 1 tablet (20 mg total) by mouth daily with supper.  ? Zinc Sulfate (ZINC 15 PO) Take 15 mg by mouth every evening.  ? metoprolol tartrate (LOPRESSOR) 25 MG tablet Take 1/2 tablet by mouth twice a day as needed for breakthrough afib (Patient not taking: Reported on 12/07/2021)  ? pantoprazole (PROTONIX) 40 MG tablet Take 1 tablet (40 mg total) by mouth daily.  ? ?No facility-administered encounter medications on file as of 12/07/2021.  ? ? ?Allergies (verified) ?Flecainide  ? ?History: ?Past Medical History:  ?Diagnosis Date  ? Basal cell carcinoma 2009  ? back  ? [place] of [place]   ? pT2c No Mx, Gleason 3+4=7  ? Chronic systolic dysfunction of left ventricle   ? EF 45% by echo 2/18  ? Diverticulitis   ? Kidney stones   ? Left rotator cuff tear 07/10/2013  ? Nephrolithiasis   ? Paroxysmal atrial fibrillation ([facility])   ? Rheumatic fever 1952-1953  ? ?Past Surgical History:  ?  Procedure Laterality Date  ? ATRIAL FIBRILLATION ABLATION N/A 02/26/2017  ? Procedure: Atrial Fibrillation Ablation;  Surgeon: Thompson Grayer, MD;  Location: [facility];  Service: Cardiovascular;  Laterality: N/A;  ? ATRIAL FIBRILLATION ABLATION N/A 03/17/2021  ? Procedure: ATRIAL FIBRILLATION ABLATION;  Surgeon: Thompson Grayer, MD;  Location: [facility];   Service: Cardiovascular;  Laterality: N/A;  ? bilateral inguinal hernia repair  07/10/2012  ? lap BIH repairs  ? CARDIOVERSION N/A 03/07/2017  ? Procedure: CARDIOVERSION;  Surgeon: Fay Records, MD;  Location: [facility];  Service: Cardiovascular;  Laterality: N/A;  ? COLONOSCOPY    ? KNEE ARTHROSCOPY W/ PARTIAL MEDIAL MENISCECTOMY  2005  ? Sharol Given- arthroscopy, partial medial menisectomy-lt  ? ROBOT ASSISTED LAPAROSCOPIC RADICAL PROSTATECTOMY  12/19/06  ? robotic prostatectomy  ? rotator cuff surgery  2002  ? Gioffre-rt  ? SHOULDER ARTHROSCOPY WITH ROTATOR CUFF REPAIR AND SUBACROMIAL DECOMPRESSION Left 07/10/2013  ? Procedure: LEFT SHOULDER ARTHROSCOPY WITH ARTHROSCOPIC ROTATOR CUFF REPAIR AND SUBACROMIAL DECOMPRESSION, PARTIAL ACROMIOPLASTY WITH CORACROMIAL RELEASE;  Surgeon: Johnny Bridge, MD;  Location: [facility];  Service: Orthopedics;  Laterality: Left;  ? ?Family History  ?Problem Relation Age of Onset  ? Breast cancer Mother   ? Cancer Brother 77  ?     brain tumor  ? Heart disease Other   ?     fam hx  ? Arthritis Other   ?     other relative  ? Prostate cancer Other   ?     nephew  ? Lung cancer Other 69  ?     nephew  ? Arrhythmia Father   ? ?Social History  ? ?Socioeconomic History  ? Marital status: Married  ?  Spouse name: Not on file  ? Number of children: Not on file  ? Years of education: Not on file  ? Highest education level: Not on file  ?Occupational History  ? Occupation: retired Licensed conveyancer)  ?  Employer: [employer]  ?Tobacco Use  ? Smoking status: Former  ?  Types: Cigarettes  ?  Quit date: 09/25/1971  ?  Years since quitting: 50.2  ? Smokeless tobacco: Never  ?Vaping Use  ? Vaping Use: Never used  ?Substance and Sexual Activity  ? Alcohol use: No  ? Drug use: No  ? Sexual activity: Not on file  ?Other Topics Concern  ? Not on file  ?Social History Narrative  ? Regular exercise: yes - runner 5d/wk, former marathon runner  ? Diet: no red meat, good water,  fruits/vegetables daily   ? ?Social Determinants of Health  ? ?Financial Resource Strain: Low Risk   ? Difficulty of Paying Living Expenses: Not hard at all  ?Food Insecurity: No Food Insecurity  ? Worried About Charity fundraiser in the Last Year: Never true  ? Ran Out of Food in the Last Year: Never true  ?Transportation Needs: No Transportation Needs  ? Lack of Transportation (Medical): No  ? Lack of Transportation (Non-Medical): No  ?Physical Activity: Sufficiently Active  ? Days of Exercise per Week: 3 days  ? Minutes of Exercise per Session: 60 min  ?Stress: No Stress Concern Present  ? Feeling of Stress : Not at all  ?Social Connections: Socially Integrated  ? Frequency of Communication with Friends and Family: More than three times a week  ? Frequency of Social Gatherings with Friends and Family: More than three times a week  ? Attends Religious Services: More  than 4 times per year  ? Active Member of Clubs or Organizations: Yes  ? Attends Archivist Meetings: More than 4 times per year  ? Marital Status: Married  ? ? ?Tobacco Counseling ?Counseling given: Not Answered ? ? ?Clinical Intake: ? ?Pre-visit preparation completed: Yes ? ?Pain : No/denies pain ? ?  ? ?BMI - recorded: 24.27 ?Nutritional Status: BMI of 19-24  Normal ?Nutritional Risks: None ?Diabetes: No ? ?How often do you need to have someone help you when you read instructions, pamphlets, or other written materials from your doctor or pharmacy?: 1 - Never ? ?Diabetic? No ? ?Interpreter Needed?: No ? ?Information entered by :: Orrin Brigham LPN ? ? ?Activities of Daily Living ?In your present state of health, do you have any difficulty performing the following activities: 12/07/2021  ?Hearing? N  ?Vision? N  ?Difficulty concentrating or making decisions? N  ?Walking or climbing stairs? N  ?Dressing or bathing? N  ?Doing errands, shopping? N  ?Preparing Food and eating ? N  ?Using the Toilet? N  ?In the past six months, have you  accidently leaked urine? N  ?Do you have problems with loss of bowel control? N  ?Managing your Medications? N  ?Managing your Finances? N  ?Housekeeping or managing your Housekeeping? N  ?Some recent data might be hidden  ? ?

## 2021-12-07 ENCOUNTER — Other Ambulatory Visit: Payer: Self-pay | Admitting: *Deleted

## 2021-12-07 ENCOUNTER — Other Ambulatory Visit: Payer: Self-pay

## 2021-12-07 ENCOUNTER — Other Ambulatory Visit (INDEPENDENT_AMBULATORY_CARE_PROVIDER_SITE_OTHER): Payer: Medicare Other

## 2021-12-07 ENCOUNTER — Ambulatory Visit (INDEPENDENT_AMBULATORY_CARE_PROVIDER_SITE_OTHER): Payer: Medicare Other

## 2021-12-07 ENCOUNTER — Telehealth (INDEPENDENT_AMBULATORY_CARE_PROVIDER_SITE_OTHER): Payer: Medicare Other | Admitting: *Deleted

## 2021-12-07 VITALS — Ht 71.0 in | Wt 174.0 lb

## 2021-12-07 DIAGNOSIS — Z8546 Personal history of malignant neoplasm of prostate: Secondary | ICD-10-CM | POA: Diagnosis not present

## 2021-12-07 DIAGNOSIS — E785 Hyperlipidemia, unspecified: Secondary | ICD-10-CM | POA: Diagnosis not present

## 2021-12-07 DIAGNOSIS — Z0000 Encounter for general adult medical examination without abnormal findings: Secondary | ICD-10-CM | POA: Diagnosis not present

## 2021-12-07 DIAGNOSIS — Z79899 Other long term (current) drug therapy: Secondary | ICD-10-CM

## 2021-12-07 LAB — CBC WITH DIFFERENTIAL/PLATELET
Basophils Absolute: 0.1 10*3/uL (ref 0.0–0.1)
Basophils Relative: 1 % (ref 0.0–3.0)
Eosinophils Absolute: 0.1 10*3/uL (ref 0.0–0.7)
Eosinophils Relative: 2.8 % (ref 0.0–5.0)
HCT: 41.6 % (ref 39.0–52.0)
Hemoglobin: 14.1 g/dL (ref 13.0–17.0)
Lymphocytes Relative: 28.2 % (ref 12.0–46.0)
Lymphs Abs: 1.5 10*3/uL (ref 0.7–4.0)
MCHC: 33.9 g/dL (ref 30.0–36.0)
MCV: 93.5 fl (ref 78.0–100.0)
Monocytes Absolute: 0.4 10*3/uL (ref 0.1–1.0)
Monocytes Relative: 6.8 % (ref 3.0–12.0)
Neutro Abs: 3.2 10*3/uL (ref 1.4–7.7)
Neutrophils Relative %: 61.2 % (ref 43.0–77.0)
Platelets: 240 10*3/uL (ref 150.0–400.0)
RBC: 4.45 Mil/uL (ref 4.22–5.81)
RDW: 13.8 % (ref 11.5–15.5)
WBC: 5.2 10*3/uL (ref 4.0–10.5)

## 2021-12-07 LAB — BASIC METABOLIC PANEL
BUN: 15 mg/dL (ref 6–23)
CO2: 31 mEq/L (ref 19–32)
Calcium: 9.6 mg/dL (ref 8.4–10.5)
Chloride: 104 mEq/L (ref 96–112)
Creatinine, Ser: 1.09 mg/dL (ref 0.40–1.50)
GFR: 65.31 mL/min (ref >60.00)
Glucose, Bld: 88 mg/dL (ref 70–99)
Potassium: 4.7 mEq/L (ref 3.5–5.1)
Sodium: 140 mEq/L (ref 135–145)

## 2021-12-07 LAB — LIPID PANEL
Cholesterol: 180 mg/dL (ref 0–200)
HDL: 63.1 mg/dL (ref >39.00)
LDL Cholesterol: 104 mg/dL — ABNORMAL HIGH (ref 0–99)
NonHDL: 117.14
Total CHOL/HDL Ratio: 3
Triglycerides: 66 mg/dL (ref 0.0–149.0)
VLDL: 13.2 mg/dL (ref 0.0–40.0)

## 2021-12-07 LAB — HEPATIC FUNCTION PANEL
ALT: 16 U/L (ref 0–53)
AST: 24 U/L (ref 0–37)
Albumin: 4.3 g/dL (ref 3.5–5.2)
Alkaline Phosphatase: 74 U/L (ref 39–117)
Bilirubin, Direct: 0.1 mg/dL (ref 0.0–0.3)
Total Bilirubin: 0.7 mg/dL (ref 0.2–1.2)
Total Protein: 6.6 g/dL (ref 6.0–8.3)

## 2021-12-07 NOTE — Telephone Encounter (Signed)
Pt came in for labs for his CPE, no orders are in his chart, please orders labs, pt has been drawn but need orders for bloodwork, thanks  ?

## 2021-12-07 NOTE — Telephone Encounter (Signed)
I am very sorry.  I just ordered them now. ?

## 2021-12-07 NOTE — Patient Instructions (Addendum)
Mr. Stilley , ?Thank you for taking time to come for your Medicare Wellness Visit. I appreciate your ongoing commitment to your health goals. Please review the following plan we discussed and let me know if I can assist you in the future.  ? ?Screening recommendations/referrals: ?Colonoscopy: no longer required  ?Recommended yearly ophthalmology/optometry visit for glaucoma screening and checkup ?Recommended yearly dental visit for hygiene and checkup ? ?Vaccinations: ?Influenza vaccine: up to date  ?Pneumococcal vaccine: up to date  ?Tdap vaccine: up to date, due 12/21/24 ?Shingles vaccine: up to date    ?Covid-19: newest booster available at your local pharmacy ? ?Advanced directives: copy on file  ? ?Conditions/risks identified: see problem list  ? ?Next appointment: Follow up in one year for your annual wellness visit.  ? ?Preventive Care 90 Years and Older, Male ?Preventive care refers to lifestyle choices and visits with your health care provider that can promote health and wellness. ?What does preventive care include? ?A yearly physical exam. This is also called an annual well check. ?Dental exams once or twice a year. ?Routine eye exams. Ask your health care provider how often you should have your eyes checked. ?Personal lifestyle choices, including: ?Daily care of your teeth and gums. ?Regular physical activity. ?Eating a healthy diet. ?Avoiding tobacco and drug use. ?Limiting alcohol use. ?Practicing safe sex. ?Taking low doses of aspirin every day. ?Taking vitamin and mineral supplements as recommended by your health care provider. ?What happens during an annual well check? ?The services and screenings done by your health care provider during your annual well check will depend on your age, overall health, lifestyle risk factors, and family history of disease. ?Counseling  ?Your health care provider may ask you questions about your: ?Alcohol use. ?Tobacco use. ?Drug use. ?Emotional well-being. ?Home and  relationship well-being. ?Sexual activity. ?Eating habits. ?History of falls. ?Memory and ability to understand (cognition). ?Work and work Statistician. ?Screening  ?You may have the following tests or measurements: ?Height, weight, and BMI. ?Blood pressure. ?Lipid and cholesterol levels. These may be checked every 5 years, or more frequently if you are over 58 years old. ?Skin check. ?Lung cancer screening. You may have this screening every year starting at age 89 if you have a 30-pack-year history of smoking and currently smoke or have quit within the past 15 years. ?Fecal occult blood test (FOBT) of the stool. You may have this test every year starting at age 69. ?Flexible sigmoidoscopy or colonoscopy. You may have a sigmoidoscopy every 5 years or a colonoscopy every 10 years starting at age 1. ?Prostate cancer screening. Recommendations will vary depending on your family history and other risks. ?Hepatitis C blood test. ?Hepatitis B blood test. ?Sexually transmitted disease (STD) testing. ?Diabetes screening. This is done by checking your blood sugar (glucose) after you have not eaten for a while (fasting). You may have this done every 1-3 years. ?Abdominal aortic aneurysm (AAA) screening. You may need this if you are a current or former smoker. ?Osteoporosis. You may be screened starting at age 72 if you are at high risk. ?Talk with your health care provider about your test results, treatment options, and if necessary, the need for more tests. ?Vaccines  ?Your health care provider may recommend certain vaccines, such as: ?Influenza vaccine. This is recommended every year. ?Tetanus, diphtheria, and acellular pertussis (Tdap, Td) vaccine. You may need a Td booster every 10 years. ?Zoster vaccine. You may need this after age 67. ?Pneumococcal 13-valent conjugate (PCV13) vaccine. One dose  is recommended after age 66. ?Pneumococcal polysaccharide (PPSV23) vaccine. One dose is recommended after age 6. ?Talk to your  health care provider about which screenings and vaccines you need and how often you need them. ?This information is not intended to replace advice given to you by your health care provider. Make sure you discuss any questions you have with your health care provider. ?Document Released: 10/07/2015 Document Revised: 05/30/2016 Document Reviewed: 07/12/2015 ?Elsevier Interactive Patient Education ? 2017 Guadalupe Guerra. ? ?Fall Prevention in the Home ?Falls can cause injuries. They can happen to people of all ages. There are many things you can do to make your home safe and to help prevent falls. ?What can I do on the outside of my home? ?Regularly fix the edges of walkways and driveways and fix any cracks. ?Remove anything that might make you trip as you walk through a door, such as a raised step or threshold. ?Trim any bushes or trees on the path to your home. ?Use bright outdoor lighting. ?Clear any walking paths of anything that might make someone trip, such as rocks or tools. ?Regularly check to see if handrails are loose or broken. Make sure that both sides of any steps have handrails. ?Any raised decks and porches should have guardrails on the edges. ?Have any leaves, snow, or ice cleared regularly. ?Use sand or salt on walking paths during winter. ?Clean up any spills in your garage right away. This includes oil or grease spills. ?What can I do in the bathroom? ?Use night lights. ?Install grab bars by the toilet and in the tub and shower. Do not use towel bars as grab bars. ?Use non-skid mats or decals in the tub or shower. ?If you need to sit down in the shower, use a plastic, non-slip stool. ?Keep the floor dry. Clean up any water that spills on the floor as soon as it happens. ?Remove soap buildup in the tub or shower regularly. ?Attach bath mats securely with double-sided non-slip rug tape. ?Do not have throw rugs and other things on the floor that can make you trip. ?What can I do in the bedroom? ?Use night  lights. ?Make sure that you have a light by your bed that is easy to reach. ?Do not use any sheets or blankets that are too big for your bed. They should not hang down onto the floor. ?Have a firm chair that has side arms. You can use this for support while you get dressed. ?Do not have throw rugs and other things on the floor that can make you trip. ?What can I do in the kitchen? ?Clean up any spills right away. ?Avoid walking on wet floors. ?Keep items that you use a lot in easy-to-reach places. ?If you need to reach something above you, use a strong step stool that has a grab bar. ?Keep electrical cords out of the way. ?Do not use floor polish or wax that makes floors slippery. If you must use wax, use non-skid floor wax. ?Do not have throw rugs and other things on the floor that can make you trip. ?What can I do with my stairs? ?Do not leave any items on the stairs. ?Make sure that there are handrails on both sides of the stairs and use them. Fix handrails that are broken or loose. Make sure that handrails are as long as the stairways. ?Check any carpeting to make sure that it is firmly attached to the stairs. Fix any carpet that is loose or worn. ?Avoid  having throw rugs at the top or bottom of the stairs. If you do have throw rugs, attach them to the floor with carpet tape. ?Make sure that you have a light switch at the top of the stairs and the bottom of the stairs. If you do not have them, ask someone to add them for you. ?What else can I do to help prevent falls? ?Wear shoes that: ?Do not have high heels. ?Have rubber bottoms. ?Are comfortable and fit you well. ?Are closed at the toe. Do not wear sandals. ?If you use a stepladder: ?Make sure that it is fully opened. Do not climb a closed stepladder. ?Make sure that both sides of the stepladder are locked into place. ?Ask someone to hold it for you, if possible. ?Clearly mark and make sure that you can see: ?Any grab bars or handrails. ?First and last  steps. ?Where the edge of each step is. ?Use tools that help you move around (mobility aids) if they are needed. These include: ?Canes. ?Walkers. ?Scooters. ?Crutches. ?Turn on the lights when you go into a dark

## 2021-12-07 NOTE — Addendum Note (Signed)
Addended by: Tammi Sou on: 12/07/2021 09:38 AM ? ? Modules accepted: Orders ? ?

## 2021-12-11 LAB — PSA, TOTAL WITH REFLEX TO PSA, FREE: PSA, Total: 0.1 ng/mL (ref <=4.0)

## 2021-12-12 ENCOUNTER — Ambulatory Visit (HOSPITAL_COMMUNITY)
Admission: RE | Admit: 2021-12-12 | Discharge: 2021-12-12 | Disposition: A | Discharge status: Home / Self Care | Payer: Medicare Other | Source: Ambulatory Visit | Attending: Nurse Practitioner | Admitting: Nurse Practitioner

## 2021-12-12 ENCOUNTER — Other Ambulatory Visit: Payer: Self-pay

## 2021-12-12 ENCOUNTER — Encounter (HOSPITAL_COMMUNITY): Payer: Self-pay | Admitting: Nurse Practitioner

## 2021-12-12 VITALS — BP 128/68 | HR 57 | Ht 71.0 in | Wt 179.0 lb

## 2021-12-12 DIAGNOSIS — D6869 Other thrombophilia: Secondary | ICD-10-CM

## 2021-12-12 DIAGNOSIS — Z7901 Long term (current) use of anticoagulants: Secondary | ICD-10-CM | POA: Diagnosis not present

## 2021-12-12 DIAGNOSIS — I480 Paroxysmal atrial fibrillation: Secondary | ICD-10-CM | POA: Diagnosis not present

## 2021-12-12 DIAGNOSIS — I4891 Unspecified atrial fibrillation: Secondary | ICD-10-CM | POA: Diagnosis not present

## 2021-12-12 NOTE — Progress Notes (Addendum)
? ?Primary Care Physician: [name], MD ?Referring Physician: Dr. [name]  ? ? ?Derek [name] is a 76 y.o. male with a h/o afib with second ablation one month ago. He had a chainsaw accident where he cut the tips pf his 3 rt finger tips and because of bleeding proceeded to the ER. Strips from  EMS read as afib but it appears to me to be a wandering pacemaker going between a junctional and SR on the strips. EKG today shows SR with PAC's. It was a regular rhythm. His fingertips are healing nicely and he has not noted any afib. No swallowing or groin issues. Continues on xarelto with a CHA2DS2VASc  score of 2.  ? ?F/u in afib clinic, 12/13/21. He is now 9 months s/p ablation. He is in SR today. He reports that he has not had any significant afib. No issues with xarelto. He feels well.  ? ?Today, he denies symptoms of palpitations, chest pain, shortness of breath, orthopnea, PND, lower extremity edema, dizziness, presyncope, syncope, or neurologic sequela. The patient is tolerating medications without difficulties and is otherwise without complaint today.  ? ?Past Medical History:  ?Diagnosis Date  ? Basal cell carcinoma 2009  ? back  ? Cancer of prostate ([place])   ? pT2c No Mx, Gleason 3+4=7  ? Chronic systolic dysfunction of left ventricle   ? EF 45% by echo 2/18  ? Diverticulitis   ? Kidney stones   ? Left rotator cuff tear 07/10/2013  ? Nephrolithiasis   ? Paroxysmal atrial fibrillation ([facility])   ? Rheumatic fever 1952-1953  ? ?Past Surgical History:  ?Procedure Laterality Date  ? ATRIAL FIBRILLATION ABLATION N/A 02/26/2017  ? Procedure: Atrial Fibrillation Ablation;  Surgeon: Thompson Grayer, MD;  Location: [facility];  Service: Cardiovascular;  Laterality: N/A;  ? ATRIAL FIBRILLATION ABLATION N/A 03/17/2021  ? Procedure: ATRIAL FIBRILLATION ABLATION;  Surgeon: Thompson Grayer, MD;  Location: [facility];  Service: Cardiovascular;  Laterality: N/A;  ? bilateral inguinal hernia repair  07/10/2012  ? lap  BIH repairs  ? CARDIOVERSION N/A 03/07/2017  ? Procedure: CARDIOVERSION;  Surgeon: Fay Records, MD;  Location: [facility];  Service: Cardiovascular;  Laterality: N/A;  ? COLONOSCOPY    ? KNEE ARTHROSCOPY W/ PARTIAL MEDIAL MENISCECTOMY  2005  ? Sharol Given- arthroscopy, partial medial menisectomy-lt  ? ROBOT ASSISTED LAPAROSCOPIC RADICAL PROSTATECTOMY  12/19/06  ? robotic prostatectomy  ? rotator cuff surgery  2002  ? Gioffre-rt  ? SHOULDER ARTHROSCOPY WITH ROTATOR CUFF REPAIR AND SUBACROMIAL DECOMPRESSION Left 07/10/2013  ? Procedure: LEFT SHOULDER ARTHROSCOPY WITH ARTHROSCOPIC ROTATOR CUFF REPAIR AND SUBACROMIAL DECOMPRESSION, PARTIAL ACROMIOPLASTY WITH CORACROMIAL RELEASE;  Surgeon: Johnny Bridge, MD;  Location: [facility];  Service: Orthopedics;  Laterality: Left;  ? ? ?Current Outpatient Medications  ?Medication Sig Dispense Refill  ? Artificial Tear Solution (GENTEAL TEARS) 0.1-0.2-0.3 % SOLN Place 1-2 drops into both eyes 3 (three) times daily as needed (dry/irritated eyes).    ? Cholecalciferol (VITAMIN D) 50 MCG (2000 UT) tablet Take 2,000 Units by mouth in the morning.    ? metoprolol tartrate (LOPRESSOR) 25 MG tablet Take 1/2 tablet by mouth twice a day as needed for breakthrough afib 30 tablet 1  ? rivaroxaban (XARELTO) 20 MG TABS tablet Take 1 tablet (20 mg total) by mouth daily with supper. 30 tablet 6  ? Zinc Sulfate (ZINC 15 PO) Take 15 mg by mouth every evening.    ? ?No current facility-administered medications  for this encounter.  ? ? ?Allergies  ?Allergen Reactions  ? Flecainide Other (See Comments)  ?  Dizziness and fatigue  ? ? ?Social History  ? ?Socioeconomic History  ? Marital status: Married  ?  Spouse name: Not on file  ? Number of children: Not on file  ? Years of education: Not on file  ? Highest education level: Not on file  ?Occupational History  ? Occupation: retired Licensed conveyancer)  ?  Employer: [employer]  ?Tobacco Use  ? Smoking status: Former  ?  Types:  Cigarettes  ?  Quit date: 09/25/1971  ?  Years since quitting: 50.2  ? Smokeless tobacco: Never  ?Vaping Use  ? Vaping Use: Never used  ?Substance and Sexual Activity  ? Alcohol use: No  ? Drug use: No  ? Sexual activity: Not on file  ?Other Topics Concern  ? Not on file  ?Social History Narrative  ? Regular exercise: yes - runner 5d/wk, former marathon runner  ? Diet: no red meat, good water, fruits/vegetables daily   ? ?Social Determinants of Health  ? ?Financial Resource Strain: Low Risk   ? Difficulty of Paying Living Expenses: Not hard at all  ?Food Insecurity: No Food Insecurity  ? Worried About Charity fundraiser in the Last Year: Never true  ? Ran Out of Food in the Last Year: Never true  ?Transportation Needs: No Transportation Needs  ? Lack of Transportation (Medical): No  ? Lack of Transportation (Non-Medical): No  ?Physical Activity: Sufficiently Active  ? Days of Exercise per Week: 3 days  ? Minutes of Exercise per Session: 60 min  ?Stress: No Stress Concern Present  ? Feeling of Stress : Not at all  ?Social Connections: Socially Integrated  ? Frequency of Communication with Friends and Family: More than three times a week  ? Frequency of Social Gatherings with Friends and Family: More than three times a week  ? Attends Religious Services: More than 4 times per year  ? Active Member of Clubs or Organizations: Yes  ? Attends Archivist Meetings: More than 4 times per year  ? Marital Status: Married  ?Intimate Partner Violence: Not At Risk  ? Fear of Current or Ex-Partner: No  ? Emotionally Abused: No  ? Physically Abused: No  ? Sexually Abused: No  ? ? ?Family History  ?Problem Relation Age of Onset  ? Breast cancer Mother   ? Cancer Brother 14  ?     brain tumor  ? Heart disease Other   ?     fam hx  ? Arthritis Other   ?     other relative  ? Prostate cancer Other   ?     nephew  ? Lung cancer Other 87  ?     nephew  ? Arrhythmia Father   ? ? ?ROS- All systems are reviewed and negative except  as per the HPI above ? ?Physical Exam: ?Vitals:  ? 12/12/21 0825  ?Weight: 81.2 kg  ?Height: '5\' 11"'$  (1.803 m)  ? ?Wt Readings from Last 3 Encounters:  ?12/12/21 81.2 kg  ?12/07/21 78.9 kg  ?06/14/21 78.6 kg  ? ? ?Labs: ?Lab Results  ?Component Value Date  ? NA 140 12/07/2021  ? K 4.7 12/07/2021  ? CL 104 12/07/2021  ? CO2 31 12/07/2021  ? GLUCOSE 88 12/07/2021  ? BUN 15 12/07/2021  ? CREATININE 1.09 12/07/2021  ? CALCIUM 9.6 12/07/2021  ? ?Lab Results  ?Component  Value Date  ? INR 1.1 11/16/2016  ? ?Lab Results  ?Component Value Date  ? CHOL 180 12/07/2021  ? HDL 63.10 12/07/2021  ? LDLCALC 104 (H) 12/07/2021  ? TRIG 66.0 12/07/2021  ? ? ? ?GEN- The patient is well appearing, alert and oriented x 3 today.   ?Head- normocephalic, atraumatic ?Eyes-  Sclera clear, conjunctiva pink ?Ears- hearing intact ?Oropharynx- clear ?Neck- supple, no JVP ?Lymph- no cervical lymphadenopathy ?Lungs- Clear to ausculation bilaterally, normal work of breathing ?Heart- Regular rate and rhythm, no murmurs, rubs or gallops, PMI not laterally displaced ?GI- soft, NT, ND, + BS ?Extremities- no clubbing, cyanosis, or edema ?MS- no significant deformity or atrophy ?Skin- no rash or lesion ?Psych- euthymic mood, full affect ?Neuro- strength and sensation are intact ? ?EKG- Vent. rate 57 BPM ?PR interval 148 ms ?QRS duration 76 ms ?QT/QTcB 404/393 ms ?P-R-T axes 41 -33 52 ?Sinus bradycardia ?Left axis deviation ?Abnormal ECG ?When compared with ECG of 13-Apr-2021 08:33, ? ? ? ?Assessment and Plan:  ?1. Afib  ?Nine months s/p 2nd ablation ?Maintaining  SR  ?Is not on any rhythm or rate control meds ? ?2. CHA2DS2VASc  score of 2(age)  ?Continue xarelto 20 mg daily  ? ?Will refer to establish with Dr. Quentin Ore in 6 months  ? ?Geroge Baseman Kayleen Memos, ANP-C ?Afib Clinic ?[facility] ?[address] ?[place], [place] [address] ?[phone]  ? ? ?

## 2021-12-12 NOTE — Addendum Note (Signed)
Encounter addended by: Sherran Needs, NP on: 12/12/2021 8:59 AM ? Actions taken: Clinical Note Signed

## 2021-12-18 ENCOUNTER — Other Ambulatory Visit: Payer: Self-pay

## 2021-12-18 ENCOUNTER — Encounter: Payer: Self-pay | Admitting: Family Medicine

## 2021-12-18 ENCOUNTER — Ambulatory Visit (INDEPENDENT_AMBULATORY_CARE_PROVIDER_SITE_OTHER): Payer: Medicare Other | Admitting: Family Medicine

## 2021-12-18 VITALS — BP 100/66 | HR 55 | Temp 98.6°F | Ht 68.5 in | Wt 176.2 lb

## 2021-12-18 DIAGNOSIS — Z8546 Personal history of malignant neoplasm of prostate: Secondary | ICD-10-CM | POA: Diagnosis not present

## 2021-12-18 DIAGNOSIS — R1013 Epigastric pain: Secondary | ICD-10-CM

## 2021-12-18 DIAGNOSIS — Z8679 Personal history of other diseases of the circulatory system: Secondary | ICD-10-CM | POA: Diagnosis not present

## 2021-12-18 NOTE — Progress Notes (Signed)
? ? ?Tamyka Bezio T. Davinia Riccardi, MD, [facility] ?Therapist, music at [facility] ?[address] [place] ?[place], [address] ? ?Phone: [phone]  FAX: [phone] ? ?Derek [name] - 76 y.o. male  MRN [id number]  Date of Birth: 29-Nov-1945 ? ?Date: 12/18/2021  PCP: [name], MD  Referral: [name], MD ? ?Chief Complaint  ?Patient presents with  ? Annual Exam  ?  Part 2  ? ? ?This visit occurred during the SARS-CoV-2 public health emergency.  Safety protocols were in place, including screening questions prior to the visit, additional usage of staff PPE, and extensive cleaning of exam room while observing appropriate contact time as indicated for disinfecting solutions.  ? ?Subjective:  ? ?Derek [name] is a 76 y.o. very pleasant male patient with Body mass index is 26.4 kg/m?. who presents with the following: ? ?General medical f/u s/p AMW ? ?H/o AF and prior distant prostate cancer. ?These are stable compliant with all meds ? ?Bivalent Covid ?- at his last booster. ?Got about four minutes away, felt black and eyes went dark.  ?Eye MD thought something. ? ?dyspepsia ?Three weeks ago got some indigestion.  Took some tums.  ?Took some Omeprazole went away.  ? ?Basically doing really well. ? ?Health Maintenance  ?Topic Date Due  ? COVID-19 Vaccine (4 - Booster for [place] series) 08/25/2020  ? COLONOSCOPY (Pts 45-60yr Insurance coverage will need to be confirmed)  07/02/2023  ? TETANUS/TDAP  12/21/2024  ? Pneumonia Vaccine 78 Years old  Completed  ? INFLUENZA VACCINE  Completed  ? Hepatitis C Screening  Completed  ? Zoster Vaccines- Shingrix  Completed  ? HPV VACCINES  Aged Out  ?  ?Immunization History  ?Administered Date(s) Administered  ? Fluad Quad(high Dose 65+) 07/07/2019  ? Influenza, High Dose Seasonal PF 08/21/2021  ? Influenza,inj,Quad PF,6+ Mos 06/25/2013, 06/16/2014, 07/21/2015, 07/02/2016, 07/22/2017, 08/27/2018  ? Influenza-Unspecified 08/22/2020  ? PFIZER(Purple  Top)SARS-COV-2 Vaccination 10/13/2019, 11/03/2019, 06/30/2020  ? Pneumococcal Conjugate-13 05/03/2015  ? Pneumococcal Polysaccharide-23 08/14/2006, 06/25/2013  ? Tdap 12/22/2014  ? Zoster Recombinat (Shingrix) 07/08/2018, 09/09/2018  ? Zoster, Live 08/14/2006  ?  ? ?Review of Systems is noted in the HPI, as appropriate ? ?Objective:  ? ?BP 100/66   Pulse (!) 55   Temp 98.6 ?F (37 ?C) (Oral)   Ht 5' 8.5" (1.74 m)   Wt 176 lb 3 oz (79.9 kg)   SpO2 99%   BMI 26.40 kg/m?  ? ?GEN: No acute distress; alert,appropriate. ?PULM: Breathing comfortably in no respiratory distress ?PSYCH: Normally interactive.  ?CV: RRR, no m/g/r  ?ABD: S, NT, ND, + BS, No rebound, No HSM  ?PULM: Normal respiratory rate, no accessory muscle use. No wheezes, crackles or rhonchi  ? ?Laboratory and Imaging Data: ?Results for orders placed or performed in visit on 12/07/21  ?Lipid panel  ?Result Value Ref Range  ? Cholesterol 180 0 - 200 mg/dL  ? Triglycerides 66.0 0.0 - 149.0 mg/dL  ? HDL 63.10 >39.00 mg/dL  ? VLDL 13.2 0.0 - 40.0 mg/dL  ? LDL Cholesterol 104 (H) 0 - 99 mg/dL  ? Total CHOL/HDL Ratio 3   ? NonHDL 117.14   ?Basic metabolic panel  ?Result Value Ref Range  ? Sodium 140 135 - 145 mEq/L  ? Potassium 4.7 3.5 - 5.1 mEq/L  ? Chloride 104 96 - 112 mEq/L  ? CO2 31 19 - 32 mEq/L  ? Glucose, Bld 88 70 - 99 mg/dL  ? BUN 15 6 -  23 mg/dL  ? Creatinine, Ser 1.09 0.40 - 1.50 mg/dL  ? GFR 65.31 >60.00 mL/min  ? Calcium 9.6 8.4 - 10.5 mg/dL  ?CBC with Differential/Platelet  ?Result Value Ref Range  ? WBC 5.2 4.0 - 10.5 K/uL  ? RBC 4.45 4.22 - 5.81 Mil/uL  ? Hemoglobin 14.1 13.0 - 17.0 g/dL  ? HCT 41.6 39.0 - 52.0 %  ? MCV 93.5 78.0 - 100.0 fl  ? MCHC 33.9 30.0 - 36.0 g/dL  ? RDW 13.8 11.5 - 15.5 %  ? Platelets 240.0 150.0 - 400.0 K/uL  ? Neutrophils Relative % 61.2 43.0 - 77.0 %  ? Lymphocytes Relative 28.2 12.0 - 46.0 %  ? Monocytes Relative 6.8 3.0 - 12.0 %  ? Eosinophils Relative 2.8 0.0 - 5.0 %  ? Basophils Relative 1.0 0.0 - 3.0 %  ? Neutro  Abs 3.2 1.4 - 7.7 K/uL  ? Lymphs Abs 1.5 0.7 - 4.0 K/uL  ? Monocytes Absolute 0.4 0.1 - 1.0 K/uL  ? Eosinophils Absolute 0.1 0.0 - 0.7 K/uL  ? Basophils Absolute 0.1 0.0 - 0.1 K/uL  ?Hepatic function panel  ?Result Value Ref Range  ? Total Bilirubin 0.7 0.2 - 1.2 mg/dL  ? Bilirubin, Direct 0.1 0.0 - 0.3 mg/dL  ? Alkaline Phosphatase 74 39 - 117 U/L  ? AST 24 0 - 37 U/L  ? ALT 16 0 - 53 U/L  ? Total Protein 6.6 6.0 - 8.3 g/dL  ? Albumin 4.3 3.5 - 5.2 g/dL  ?  ? ?Assessment and Plan:  ? ?  ICD-10-CM   ?1. Dyspepsia  R10.13   ?  ?2. History of atrial fibrillation  Z86.79   ?  ?3. PROSTATE CANCER, HX OF  Z85.46   ?  ? ?Total encounter time: 20 minutes. This includes total time spent on the day of encounter.  Chart review, lab review, discussion with patient  ? ?UTD, will hold Bivalent covid ? ?Omeprazole x 4-6 weeks for dyspepsia ? ? ?Follow-up: 1 year ? ?Dragon [employer] One speech-to-text software was used for transcription in this dictation.  Possible transcriptional errors can occur using Editor, commissioning.  ? ?Signed, ? ?Renley Banwart T. Carlita Whitcomb, MD ? ? ?Outpatient Encounter Medications as of 12/18/2021  ?Medication Sig  ? Artificial Tear Solution (GENTEAL TEARS) 0.1-0.2-0.3 % SOLN Place 1-2 drops into both eyes 3 (three) times daily as needed (dry/irritated eyes).  ? Cholecalciferol (VITAMIN D) 50 MCG (2000 UT) tablet Take 2,000 Units by mouth in the morning.  ? omeprazole (PRILOSEC) 20 MG capsule Take 20 mg by mouth daily.  ? rivaroxaban (XARELTO) 20 MG TABS tablet Take 1 tablet (20 mg total) by mouth daily with supper.  ? Zinc Sulfate (ZINC 15 PO) Take 15 mg by mouth every evening.  ? [DISCONTINUED] metoprolol tartrate (LOPRESSOR) 25 MG tablet Take 1/2 tablet by mouth twice a day as needed for breakthrough afib  ? ?No facility-administered encounter medications on file as of 12/18/2021.  ?  ?

## 2022-03-22 DIAGNOSIS — Z961 Presence of intraocular lens: Secondary | ICD-10-CM | POA: Diagnosis not present

## 2022-03-22 DIAGNOSIS — H43813 Vitreous degeneration, bilateral: Secondary | ICD-10-CM | POA: Diagnosis not present

## 2022-06-20 ENCOUNTER — Encounter: Payer: Self-pay | Admitting: Cardiology

## 2022-06-20 ENCOUNTER — Ambulatory Visit: Payer: Medicare Other | Attending: Cardiology | Admitting: Cardiology

## 2022-06-20 VITALS — BP 128/72 | HR 60 | Ht 68.5 in | Wt 176.0 lb

## 2022-06-20 DIAGNOSIS — I4819 Other persistent atrial fibrillation: Secondary | ICD-10-CM | POA: Diagnosis not present

## 2022-06-20 MED ORDER — RIVAROXABAN 20 MG PO TABS: 20.0000 mg | ORAL_TABLET | Freq: Every day | ORAL | 6 refills | Status: DC

## 2022-06-20 NOTE — Progress Notes (Signed)
Electrophysiology Office Follow up Visit Note:    Date:  06/20/2022   ID:  Derek [name], DOB 09-12-1946, MRN [id number]  PCP:  [name], MD  [facility] Cardiologist:  None  [facility] Electrophysiologist:  Vickie Epley, MD    Interval History:    Derek [name] is a 76 y.o. male who presents for a follow up visit.   The patient last saw Derek [name] December 12, 2021 for his atrial fibrillation.  He was previously followed by Dr. Rayann [name].  He takes Xarelto for stroke prophylaxis.  He has had 2 prior A-fib ablations.  Today:  He is feeling well, and he has not felt any afib recently.   He is still taking Xarelto. He bleeds slightly more than usual if he nicks himself shaving.   He stays active with yard-work.   He denies any palpitations, chest pain, shortness of breath, or peripheral edema. No lightheadedness, headaches, syncope, orthopnea, or PND.      Past Medical History:  Diagnosis Date   Basal cell carcinoma 2009   back   Cancer of prostate ([place])    pT2c No Mx, Gleason 9+4=8   Chronic systolic dysfunction of left ventricle    EF 45% by echo 2/18   Diverticulitis    Kidney stones    Left rotator cuff tear 07/10/2013   Nephrolithiasis    Paroxysmal atrial fibrillation ([place])    Rheumatic fever 1952-1953    Past Surgical History:  Procedure Laterality Date   ATRIAL FIBRILLATION ABLATION N/A 02/26/2017   Procedure: Atrial Fibrillation Ablation;  Surgeon: Thompson Grayer, MD;  Location: [place] [facility];  Service: Cardiovascular;  Laterality: N/A;   ATRIAL FIBRILLATION ABLATION N/A 03/17/2021   Procedure: ATRIAL FIBRILLATION ABLATION;  Surgeon: Thompson Grayer, MD;  Location: [place] [facility];  Service: Cardiovascular;  Laterality: N/A;   bilateral inguinal hernia repair  07/10/2012   lap BIH repairs   CARDIOVERSION N/A 03/07/2017   Procedure: CARDIOVERSION;  Surgeon: Fay Records, MD;  Location: [facility];  Service: Cardiovascular;  Laterality:  N/A;   COLONOSCOPY     KNEE ARTHROSCOPY W/ PARTIAL MEDIAL MENISCECTOMY  2005   Duda- arthroscopy, partial medial menisectomy-lt   ROBOT ASSISTED LAPAROSCOPIC RADICAL PROSTATECTOMY  12/19/06   robotic prostatectomy   rotator cuff surgery  2002   Gioffre-rt   SHOULDER ARTHROSCOPY WITH ROTATOR CUFF REPAIR AND SUBACROMIAL DECOMPRESSION Left 07/10/2013   Procedure: LEFT SHOULDER ARTHROSCOPY WITH ARTHROSCOPIC ROTATOR CUFF REPAIR AND SUBACROMIAL DECOMPRESSION, PARTIAL ACROMIOPLASTY WITH CORACROMIAL RELEASE;  Surgeon: Johnny Bridge, MD;  Location: [place];  Service: Orthopedics;  Laterality: Left;    Current Medications: Current Meds  Medication Sig   Artificial Tear Solution (GENTEAL TEARS) 0.1-0.2-0.3 % SOLN Place 1-2 drops into both eyes 3 (three) times daily as needed (dry/irritated eyes).   Cholecalciferol (VITAMIN D) 50 MCG (2000 UT) tablet Take 2,000 Units by mouth in the morning.   omeprazole (PRILOSEC) 20 MG capsule Take 20 mg by mouth daily.   rivaroxaban (XARELTO) 20 MG TABS tablet Take 1 tablet (20 mg total) by mouth daily with supper.   Zinc Sulfate (ZINC 15 PO) Take 15 mg by mouth every evening.     Allergies:   Flecainide   Social History   Socioeconomic History   Marital status: Married    Spouse name: Not on file   Number of children: Not on file   Years of education: Not on file   Highest education level:  Not on file  Occupational History   Occupation: retired Licensed conveyancer)    Employer: [employer]  Tobacco Use   Smoking status: Former    Types: Cigarettes    Quit date: 09/25/1971    Years since quitting: 50.7   Smokeless tobacco: Never  Vaping Use   Vaping Use: Never used  Substance and Sexual Activity   Alcohol use: No   Drug use: No   Sexual activity: Not on file  Other Topics Concern   Not on file  Social History Narrative   Regular exercise: yes - runner 5d/wk, former marathon runner   Diet: no red meat, good water,  fruits/vegetables daily    Social Determinants of Health   Financial Resource Strain: Low Risk  (12/07/2021)   Overall Financial Resource Strain (CARDIA)    Difficulty of Paying Living Expenses: Not hard at all  Food Insecurity: No Food Insecurity (12/07/2021)   Hunger Vital Sign    Worried About Running Out of Food in the Last Year: Never true    [place] in the Last Year: Never true  Transportation Needs: No Transportation Needs (12/07/2021)   PRAPARE - Hydrologist (Medical): No    Lack of Transportation (Non-Medical): No  Physical Activity: Sufficiently Active (12/07/2021)   Exercise Vital Sign    Days of Exercise per Week: 3 days    Minutes of Exercise per Session: 60 min  Stress: No Stress Concern Present (12/07/2021)   [place]    Feeling of Stress : Not at all  Social Connections: [place] (12/07/2021)   Social Connection and Isolation Panel [NHANES]    Frequency of Communication with Friends and Family: More than three times a week    Frequency of Social Gatherings with Friends and Family: More than three times a week    Attends Religious Services: More than 4 times per year    Active Member of [employer] or Organizations: Yes    Attends Music therapist: More than 4 times per year    Marital Status: Married     Family History: The patient's family history includes Arrhythmia in his father; Arthritis in an other family member; Breast cancer in his mother; Cancer (age of onset: 50) in his brother; Heart disease in an other family member; Lung cancer (age of onset: 44) in an other family member; Prostate cancer in an other family member.  ROS:   Please see the history of present illness.   (+) Mildly increased bleeding    All other systems reviewed and are negative.  EKGs/Labs/Other Studies Reviewed:    The following studies were reviewed  today:  Atrial Fibrillation Ablation 03/17/21:  CONCLUSIONS: 1. Sinus rhythm upon presentation.   2. Intracardiac echo reveals a moderate sized left atrium with four separate pulmonary veins without evidence of pulmonary vein stenosis. 3. Return of electrical activity within the right inferior and left superior pulmonary veins.  The left inferior and right superior pulmonary veins were quiescent and did not require additional ablation today.  The conduction within the left superior pulmonary vein was prodigious and likely the cause of afib recurrence.  4. Successful electrical reisolation of the left superior and right inferior pulmonary veins 5. No inducible arrhythmias following ablation both on and off of Isuprel 6. No early apparent complications.  CT Cardiac Morph 03/10/21:  IMPRESSION: 1. There is normal pulmonary vein drainage into the left atrium.  2. The left atrial appendage is large - mixed chicken wing type with one dominant lobe and ostial size 29 x 23 mm and length 41 mm. There is no thrombus in the left atrial appendage.   3. The esophagus runs in the left atrial midline but is closest to the Oliver.   4. Coronary calcium score of 24. This was 14th percentile for age, sex, and race matched control.   5. Compared to 2018 study, similar pulmonary vein size. There is no evidence of esophageal-atrial communication.   6. Mild dilation with diameter 32 mm but with PA ratio 0.89.   7. Aortic arch atherosclerosis.   8. Mild coronary sinus dilation (12 mm).  Echo 02/01/21:  1. Left ventricular ejection fraction, by estimation, is 50 to 55%. The  left ventricle has low normal function. The left ventricle has no regional  wall motion abnormalities. Left ventricular diastolic parameters are  consistent with Grade I diastolic  dysfunction (impaired relaxation). The average left ventricular global  longitudinal strain is -23.1 %. The global longitudinal strain is normal.   2.  Right ventricular systolic function is normal. The right ventricular  size is normal. There is normal pulmonary artery systolic pressure.   3. The mitral valve is normal in structure. Trivial mitral valve  regurgitation. No evidence of mitral stenosis.   4. The aortic valve is normal in structure. Aortic valve regurgitation is  mild to moderate. No aortic stenosis is present. Aortic regurgitation PHT  measures 627 msec.   5. There is borderline dilatation of the aortic root, measuring 39 mm.   6. The inferior vena cava is normal in size with greater than 50%  respiratory variability, suggesting right atrial pressure of 3 mmHg.   Comparison(s): 09/10/17 EF 50-55%.    EKG:  The ekg ordered today demonstrates SR with PACs.  Recent Labs: 12/07/2021: ALT 16; BUN 15; Creatinine, Ser 1.09; Hemoglobin 14.1; Platelets 240.0; Potassium 4.7; Sodium 140  Recent Lipid Panel    Component Value Date/Time   CHOL 180 12/07/2021 0938   TRIG 66.0 12/07/2021 0938   HDL 63.10 12/07/2021 0938   CHOLHDL 3 12/07/2021 0938   VLDL 13.2 12/07/2021 0938   LDLCALC 104 (H) 12/07/2021 0938   LDLDIRECT 112.7 07/02/2011 1104    Physical Exam:    VS:  BP 128/72   Pulse 60   Ht 5' 8.5" (1.74 m)   Wt 176 lb (79.8 kg)   SpO2 97%   BMI 26.37 kg/m     Wt Readings from Last 3 Encounters:  06/20/22 176 lb (79.8 kg)  12/18/21 176 lb 3 oz (79.9 kg)  12/12/21 179 lb (81.2 kg)     GEN:  Well nourished, well developed in no acute distress HEENT: Normal NECK: No JVD; No carotid bruits LYMPHATICS: No lymphadenopathy CARDIAC: RRR, no murmurs, rubs, gallops RESPIRATORY:  Clear to auscultation without rales, wheezing or rhonchi  ABDOMEN: Soft, non-tender, non-distended MUSCULOSKELETAL:  No edema; No deformity  SKIN: Warm and dry NEUROLOGIC:  Alert and oriented x 3 PSYCHIATRIC:  Normal affect        ASSESSMENT:    1. Persistent atrial fibrillation (HCC)    PLAN:    In order of problems listed  above:  #Persistent atrial fibrillation Post ablation June 2018 and June 2022. On Xarelto for stroke prophylaxis. Doing well maintaining normal rhythm.  Follow-up 6 months with APP, and in 1 year here.    Medication Adjustments/Labs and Tests Ordered: Current medicines are reviewed at  length with the patient today.  Concerns regarding medicines are outlined above.  Orders Placed This Encounter  Procedures   EKG 12-Lead   No orders of the defined types were placed in this encounter.   I,Mary Mosetta Pigeon Buren,acting as a scribe for Vickie Epley, MD.,have documented all relevant documentation on the behalf of Vickie Epley, MD,as directed by  Vickie Epley, MD while in the presence of Vickie Epley, MD.   I, Vickie Epley, MD, have reviewed all documentation for this visit. The documentation on 06/20/22 for the exam, diagnosis, procedures, and orders are all accurate and complete.  Signed, Lars Mage, MD, [facility], [facility] 06/20/2022 1:29 PM    Electrophysiology [facility]

## 2022-06-20 NOTE — Progress Notes (Deleted)
Electrophysiology Office Follow up Visit Note:    Date:  06/20/2022   ID:  Derek [name], DOB 1946-02-27, MRN [id number]  PCP:  [name], MD  [facility] Cardiologist:  None  [facility] Electrophysiologist:  Vickie Epley, MD    Interval History:    Derek [name] is a 76 y.o. male who presents for a follow up visit.   The patient last saw [name] December 12, 2021 for his atrial fibrillation.  He was previously followed by Dr. [name].  He takes Xarelto for stroke prophylaxis.  He has had 2 prior A-fib ablations.       Past Medical History:  Diagnosis Date   Basal cell carcinoma 2009   back   Cancer of prostate ([place])    pT2c No Mx, Gleason 8+6=5   Chronic systolic dysfunction of left ventricle    EF 45% by echo 2/18   Diverticulitis    Kidney stones    Left rotator cuff tear 07/10/2013   Nephrolithiasis    Paroxysmal atrial fibrillation ([place])    Rheumatic fever 1952-1953    Past Surgical History:  Procedure Laterality Date   ATRIAL FIBRILLATION ABLATION N/A 02/26/2017   Procedure: Atrial Fibrillation Ablation;  Surgeon: Thompson Grayer, MD;  Location: [place] [facility];  Service: Cardiovascular;  Laterality: N/A;   ATRIAL FIBRILLATION ABLATION N/A 03/17/2021   Procedure: ATRIAL FIBRILLATION ABLATION;  Surgeon: Thompson Grayer, MD;  Location: [place] [facility];  Service: Cardiovascular;  Laterality: N/A;   bilateral inguinal hernia repair  07/10/2012   lap BIH repairs   CARDIOVERSION N/A 03/07/2017   Procedure: CARDIOVERSION;  Surgeon: Fay Records, MD;  Location: [facility] ENDOSCOPY;  Service: Cardiovascular;  Laterality: N/A;   COLONOSCOPY     KNEE ARTHROSCOPY W/ PARTIAL MEDIAL MENISCECTOMY  2005   Duda- arthroscopy, partial medial menisectomy-lt   ROBOT ASSISTED LAPAROSCOPIC RADICAL PROSTATECTOMY  12/19/06   robotic prostatectomy   rotator cuff surgery  2002   Gioffre-rt   SHOULDER ARTHROSCOPY WITH ROTATOR CUFF REPAIR AND SUBACROMIAL DECOMPRESSION Left  07/10/2013   Procedure: LEFT SHOULDER ARTHROSCOPY WITH ARTHROSCOPIC ROTATOR CUFF REPAIR AND SUBACROMIAL DECOMPRESSION, PARTIAL ACROMIOPLASTY WITH CORACROMIAL RELEASE;  Surgeon: Johnny Bridge, MD;  Location: [place];  Service: Orthopedics;  Laterality: Left;    Current Medications: Current Meds  Medication Sig   Artificial Tear Solution (GENTEAL TEARS) 0.1-0.2-0.3 % SOLN Place 1-2 drops into both eyes 3 (three) times daily as needed (dry/irritated eyes).   Cholecalciferol (VITAMIN D) 50 MCG (2000 UT) tablet Take 2,000 Units by mouth in the morning.   omeprazole (PRILOSEC) 20 MG capsule Take 20 mg by mouth daily.   rivaroxaban (XARELTO) 20 MG TABS tablet Take 1 tablet (20 mg total) by mouth daily with supper.   Zinc Sulfate (ZINC 15 PO) Take 15 mg by mouth every evening.     Allergies:   Flecainide   Social History   Socioeconomic History   Marital status: Married    Spouse name: Not on file   Number of children: Not on file   Years of education: Not on file   Highest education level: Not on file  Occupational History   Occupation: retired Licensed conveyancer)    Employer: [employer]  Tobacco Use   Smoking status: Former    Types: Cigarettes    Quit date: 09/25/1971    Years since quitting: 50.7   Smokeless tobacco: Never  Vaping Use   Vaping Use: Never used  Substance and Sexual Activity   Alcohol use: No   Drug use: No   Sexual activity: Not on file  Other Topics Concern   Not on file  Social History Narrative   Regular exercise: yes - runner 5d/wk, former marathon runner   Diet: no red meat, good water, fruits/vegetables daily    Social Determinants of Health   Financial Resource Strain: Low Risk  (12/07/2021)   Overall Financial Resource Strain (CARDIA)    Difficulty of Paying Living Expenses: Not hard at all  Food Insecurity: No Food Insecurity (12/07/2021)   Hunger Vital Sign    Worried About Running Out of Food in the Last Year: Never true     Ran Out of Food in the Last Year: Never true  Transportation Needs: No Transportation Needs (12/07/2021)   PRAPARE - Hydrologist (Medical): No    Lack of Transportation (Non-Medical): No  Physical Activity: Sufficiently Active (12/07/2021)   Exercise Vital Sign    Days of Exercise per Week: 3 days    Minutes of Exercise per Session: 60 min  Stress: No Stress Concern Present (12/07/2021)   [place]    Feeling of Stress : Not at all  Social Connections: [place] (12/07/2021)   Social Connection and Isolation Panel [NHANES]    Frequency of Communication with Friends and Family: More than three times a week    Frequency of Social Gatherings with Friends and Family: More than three times a week    Attends Religious Services: More than 4 times per year    Active Member of [employer] or Organizations: Yes    Attends Music therapist: More than 4 times per year    Marital Status: Married     Family History: The patient's family history includes Arrhythmia in his father; Arthritis in an other family member; Breast cancer in his mother; Cancer (age of onset: 52) in his brother; Heart disease in an other family member; Lung cancer (age of onset: 63) in an other family member; Prostate cancer in an other family member.  ROS:   Please see the history of present illness.    All other systems reviewed and are negative.  EKGs/Labs/Other Studies Reviewed:    The following studies were reviewed today:   EKG:  The ekg ordered today demonstrates ***  Recent Labs: 12/07/2021: ALT 16; BUN 15; Creatinine, Ser 1.09; Hemoglobin 14.1; Platelets 240.0; Potassium 4.7; Sodium 140  Recent Lipid Panel    Component Value Date/Time   CHOL 180 12/07/2021 0938   TRIG 66.0 12/07/2021 0938   HDL 63.10 12/07/2021 0938   CHOLHDL 3 12/07/2021 0938   VLDL 13.2 12/07/2021 0938   LDLCALC 104 (H)  12/07/2021 0938   LDLDIRECT 112.7 07/02/2011 1104    Physical Exam:    VS:  BP 128/72   Pulse 60   Ht 5' 8.5" (1.74 m)   Wt 176 lb (79.8 kg)   SpO2 97%   BMI 26.37 kg/m     Wt Readings from Last 3 Encounters:  06/20/22 176 lb (79.8 kg)  12/18/21 176 lb 3 oz (79.9 kg)  12/12/21 179 lb (81.2 kg)     GEN: *** Well nourished, well developed in no acute distress HEENT: Normal NECK: No JVD; No carotid bruits LYMPHATICS: No lymphadenopathy CARDIAC: ***RRR, no murmurs, rubs, gallops RESPIRATORY:  Clear to auscultation without rales, wheezing or rhonchi  ABDOMEN: Soft, non-tender, non-distended MUSCULOSKELETAL:  No edema; No deformity  SKIN: Warm and dry NEUROLOGIC:  Alert and oriented x 3 PSYCHIATRIC:  Normal affect        ASSESSMENT:    1. Persistent atrial fibrillation (HCC)    PLAN:    In order of problems listed above:  #Persistent atrial fibrillation Post ablation June 2018 and June 2022. On Xarelto for stroke prophylaxis.  Follow-up 1 year with APP.  Total time spent with patient today *** minutes. This includes reviewing records, evaluating the patient and coordinating care.   Medication Adjustments/Labs and Tests Ordered: Current medicines are reviewed at length with the patient today.  Concerns regarding medicines are outlined above.  No orders of the defined types were placed in this encounter.  No orders of the defined types were placed in this encounter.    Signed, Lars Mage, MD, [facility], [facility] 06/20/2022 1:21 PM    Electrophysiology [facility]

## 2022-06-20 NOTE — Patient Instructions (Signed)
Medication Instructions:  No Changes *If you need a refill on your cardiac medications before your next appointment, please call your pharmacy*   Lab Work: none If you have labs (blood work) drawn today and your tests are completely normal, you will receive your results only by: [place] (if you have MyChart) OR A paper copy in the mail If you have any lab test that is abnormal or we need to change your treatment, we will call you to review the results.   Testing/Procedures: none   Follow-Up: At [facility], you and your health needs are our priority.  As part of our continuing mission to provide you with exceptional heart care, we have created designated Provider Care Teams.  These Care Teams include your primary Cardiologist (physician) and Advanced Practice Providers (APPs -  Physician Assistants and Nurse Practitioners) who all work together to provide you with the care you need, when you need it.  We recommend signing up for the patient portal called "MyChart".  Sign up information is provided on this After Visit Summary.  MyChart is used to connect with patients for Virtual Visits (Telemedicine).  Patients are able to view lab/test results, encounter notes, upcoming appointments, etc.  Non-urgent messages can be sent to your provider as well.   To learn more about what you can do with MyChart, go to [email or web address].    Your next appointment:   6 month(s)  The format for your next appointment:   In Person  Provider:   You will see one of the following Advanced Practice Providers on your designated Care Team:   Tommye Standard, [place] Legrand Como "Jonni Sanger" Chalmers Cater, [place]      Other Instructions None   Important Information About Sugar

## 2022-06-20 NOTE — Addendum Note (Signed)
Addended by: Darrell Jewel on: 06/20/2022 01:36 PM   Modules accepted: Orders

## 2022-06-21 DIAGNOSIS — L570 Actinic keratosis: Secondary | ICD-10-CM | POA: Diagnosis not present

## 2022-06-21 DIAGNOSIS — L821 Other seborrheic keratosis: Secondary | ICD-10-CM | POA: Diagnosis not present

## 2022-06-21 DIAGNOSIS — D1801 Hemangioma of skin and subcutaneous tissue: Secondary | ICD-10-CM | POA: Diagnosis not present

## 2022-06-21 DIAGNOSIS — L720 Epidermal cyst: Secondary | ICD-10-CM | POA: Diagnosis not present

## 2022-06-21 DIAGNOSIS — Z85828 Personal history of other malignant neoplasm of skin: Secondary | ICD-10-CM | POA: Diagnosis not present

## 2022-07-18 DIAGNOSIS — Z23 Encounter for immunization: Secondary | ICD-10-CM | POA: Diagnosis not present

## 2022-09-18 ENCOUNTER — Telehealth: Payer: Self-pay | Admitting: Family Medicine

## 2022-09-18 NOTE — Telephone Encounter (Signed)
Patient is experiencing cough,congestion and mild headache. He tested negative for covid and he has had the flu shot,he would like advice on anything he can take otc that would really help him with these symptoms? He is worried about it possibly being the new respiratory virus going around.

## 2022-09-18 NOTE — Telephone Encounter (Signed)
I spoke with pt; since 09/16/22 pt has had mild H/A and dry to prod cough with yellow phlegm. Pt has some head congestion. Pt has temp today of 100.5; has taken tylenol. Pt has been taking cold eze OTC since 09-17-22. Pt has been taking vitamin D and zinc for a while.  Pt does not have CP, SOB or wheezing. Pt is concerned about RSV due to watching TV. Pt said neg home covid test on 09/18/22. Pt did have flu shot. No available appts at [facility] or [facility] on 09/18/22 or 09/19/22. Pt scheduled appt at [facility] on 09/19/22 at 4:15 pm with UC & ED precautions and pt voiced understanding. Sending note to Dr Lorelei Pont and [place] pool.

## 2022-09-19 ENCOUNTER — Ambulatory Visit
Admission: RE | Admit: 2022-09-19 | Discharge: 2022-09-19 | Disposition: A | Discharge status: Home / Self Care | Payer: Medicare Other | Source: Ambulatory Visit | Attending: Family Medicine | Admitting: Family Medicine

## 2022-09-19 VITALS — BP 105/65 | HR 73 | Temp 98.9°F | Resp 17

## 2022-09-19 DIAGNOSIS — R6889 Other general symptoms and signs: Secondary | ICD-10-CM | POA: Diagnosis not present

## 2022-09-19 NOTE — ED Provider Notes (Signed)
Derek [name]    CSN: [id number] Arrival date & time: 09/19/22  1544      History   Chief Complaint Chief Complaint  Patient presents with   Cough    Entered by patient   Chills   Facial Pain   Nasal Congestion    HPI Derek [name] is a 76 y.o. male.    Cough   Presents to urgent care with complaint of cough, congestion, sinus pressure, chills x 2 days.  Patient is concerned with possible RSV.  He denies wheezing, shortness of breath, dyspnea.  Past Medical History:  Diagnosis Date   Basal cell carcinoma 2009   back   Cancer of prostate ([place])    pT2c No Mx, Gleason 9+3=9   Chronic systolic dysfunction of left ventricle    EF 45% by echo 2/18   Diverticulitis    Kidney stones    Left rotator cuff tear 07/10/2013   Nephrolithiasis    Paroxysmal atrial fibrillation ([place])    Rheumatic fever 1952-1953    Patient Active Problem List   Diagnosis Date Noted   Advanced directives, counseling/discussion 08/27/2018   History of atrial fibrillation 10/30/2016   Bilateral inguinal hernia (BIH) s/p lap repair 07/06/2012 06/24/2012   PROSTATE CANCER, HX OF 11/10/2008   CARCINOMA, BASAL CELL, HX OF 11/10/2008   DIVERTICULITIS, HX OF 11/10/2008    Past Surgical History:  Procedure Laterality Date   ATRIAL FIBRILLATION ABLATION N/A 02/26/2017   Procedure: Atrial Fibrillation Ablation;  Surgeon: Thompson Grayer, MD;  Location: [facility];  Service: Cardiovascular;  Laterality: N/A;   ATRIAL FIBRILLATION ABLATION N/A 03/17/2021   Procedure: ATRIAL FIBRILLATION ABLATION;  Surgeon: Thompson Grayer, MD;  Location: [facility];  Service: Cardiovascular;  Laterality: N/A;   bilateral inguinal hernia repair  07/10/2012   lap BIH repairs   CARDIOVERSION N/A 03/07/2017   Procedure: CARDIOVERSION;  Surgeon: Fay Records, MD;  Location: [facility];  Service: Cardiovascular;  Laterality: N/A;   COLONOSCOPY     KNEE ARTHROSCOPY W/ PARTIAL MEDIAL MENISCECTOMY   2005   Duda- arthroscopy, partial medial menisectomy-lt   ROBOT ASSISTED LAPAROSCOPIC RADICAL PROSTATECTOMY  12/19/06   robotic prostatectomy   rotator cuff surgery  2002   Gioffre-rt   SHOULDER ARTHROSCOPY WITH ROTATOR CUFF REPAIR AND SUBACROMIAL DECOMPRESSION Left 07/10/2013   Procedure: LEFT SHOULDER ARTHROSCOPY WITH ARTHROSCOPIC ROTATOR CUFF REPAIR AND SUBACROMIAL DECOMPRESSION, PARTIAL ACROMIOPLASTY WITH CORACROMIAL RELEASE;  Surgeon: Johnny Bridge, MD;  Location: [employer];  Service: Orthopedics;  Laterality: Left;       Home Medications    Prior to Admission medications   Medication Sig Start Date End Date Taking? Authorizing Provider  Artificial Tear Solution (GENTEAL TEARS) 0.1-0.2-0.3 % SOLN Place 1-2 drops into both eyes 3 (three) times daily as needed (dry/irritated eyes).    [provider]  Cholecalciferol (VITAMIN D) 50 MCG (2000 UT) tablet Take 2,000 Units by mouth in the morning.    [provider]  omeprazole (PRILOSEC) 20 MG capsule Take 20 mg by mouth daily.    [provider]  rivaroxaban (XARELTO) 20 MG TABS tablet Take 1 tablet (20 mg total) by mouth daily with supper. 06/20/22   Vickie Epley, MD  Zinc Sulfate (ZINC 15 PO) Take 15 mg by mouth every evening.    [provider]    Family History Family History  Problem Relation Age of Onset   Breast cancer Mother    Cancer  Brother 51       brain tumor   Heart disease Other        fam hx   Arthritis Other        other relative   Prostate cancer Other        nephew   Lung cancer Other 34       nephew   Arrhythmia Father     Social History Social History   Tobacco Use   Smoking status: Former    Types: Cigarettes    Quit date: 09/25/1971    Years since quitting: 51.0   Smokeless tobacco: Never  Vaping Use   Vaping Use: Never used  Substance Use Topics   Alcohol use: No   Drug use: No     Allergies   Flecainide   Review of  Systems Review of Systems  Respiratory:  Positive for cough.      Physical Exam Triage Vital Signs ED Triage Vitals  Enc Vitals Group     BP 09/19/22 1626 105/65     Pulse Rate 09/19/22 1626 73     Resp 09/19/22 1626 17     Temp 09/19/22 1626 98.9 F (37.2 C)     Temp src --      SpO2 09/19/22 1626 95 %     Weight --      Height --      Head Circumference --      Peak Flow --      Pain Score 09/19/22 1627 0     Pain Loc --      Pain Edu? --      Excl. in [place]? --    No data found.  Updated Vital Signs BP 105/65   Pulse 73   Temp 98.9 F (37.2 C)   Resp 17   SpO2 95%   Visual Acuity Right Eye Distance:   Left Eye Distance:   Bilateral Distance:    Right Eye Near:   Left Eye Near:    Bilateral Near:     Physical Exam Vitals reviewed.  Constitutional:      Appearance: Normal appearance.  Cardiovascular:     Rate and Rhythm: Normal rate and regular rhythm.     Pulses: Normal pulses.     Heart sounds: Normal heart sounds.  Pulmonary:     Effort: Pulmonary effort is normal.     Breath sounds: Normal breath sounds. No wheezing or rhonchi.  Skin:    General: Skin is warm and dry.  Neurological:     General: No focal deficit present.     Mental Status: He is alert and oriented to person, place, and time.  Psychiatric:        Mood and Affect: Mood normal.        Behavior: Behavior normal.      UC Treatments / Results  Labs (all labs ordered are listed, but only abnormal results are displayed) Labs Reviewed - No data to display  EKG   Radiology No results found.  Procedures Procedures (including critical care time)  Medications Ordered in UC Medications - No data to display  Initial Impression / Assessment and Plan / UC Course  I have reviewed the triage vital signs and the nursing notes.  Pertinent labs & imaging results that were available during my care of the patient were reviewed by me and considered in my medical decision making (see  chart for details).   Patient is afebrile here without recent antipyretics.  Satting well on room air. Overall is well appearing, well hydrated, without respiratory distress. Pulmonary exam is unremarkable.  Lungs CTAB without wheezing, rhonchi, rales.  Symptoms are consistent with a viral process including influenza or RSV.  Explained to patient that we were unable to do influenza and RSV testing in urgent care and that we would treat him presumptively.  He is outside the treatment window for influenza and there are no acute respiratory symptoms that are concerning.  Recommended continued use of OTC medication for symptom control.  He showed the provider an assortment of medications which she is using including Mucinex.  Final Clinical Impressions(s) / UC Diagnoses   Final diagnoses:  None   Discharge Instructions   None    ED Prescriptions   None    PDMP not reviewed this encounter.   Rose Phi, [place] 09/19/22 [phone]

## 2022-09-19 NOTE — Discharge Instructions (Signed)
You have been diagnosed with a viral upper respiratory infection based on your symptoms and exam. Viral illnesses cannot be treated with antibiotics - they are self limiting - and you should find your symptoms resolving within a few days. Get plenty of rest and non-caffeinated fluids. Watch for signs of dehydration including reduced urine output and dark colored urine.  We recommend you use over-the-counter medications for symptom control including acetaminophen (Tylenol), ibuprofen (Advil/Motrin) or naproxen (Aleve) for fever, chills or body aches. You may combine use of acetaminophen and ibuprofen/naproxen if needed. Also recommend cold/cough medication, but please note that some cough medications are not recommended if you suffer from hypertension.    Saline mist spray is helpful for removing excess mucus from your nose.  Room humidifiers are helpful to ease breathing at night. I recommend guaifenesin (Mucinex) to help thin and loosen mucus secretions in your respiratory passages.   If appropriate based upon your other medical problems, you might also find relief of nasal/sinus congestion symptoms by using a nasal decongestant such as Flonase (fluticasone) or Sudafed sinus (pseudoephedrine).  You will need to obtain Sudafed from behind the pharmacist counter.  Speak to the pharmacist to verify that you are not duplicating medications with other over-the-counter formulations that you may be using.   Follow up here or with your primary care provider if your symptoms are worsening or not improving.

## 2022-09-19 NOTE — ED Triage Notes (Signed)
Pt. Presents to UC w/ c/o a cough,congestion, sinus pressure, and chills for the past 2 days.

## 2022-10-01 ENCOUNTER — Telehealth: Payer: Self-pay | Admitting: Family Medicine

## 2022-10-01 NOTE — Telephone Encounter (Signed)
I spoke with pt; pt said was seen [facility] on 09/20/23.pt said he was advised UC did not have RSV or flu test at that time. Pt was advised to continue OTC med. Pt has been taking mucinex and drinking a lot of water and pt still has prod cough with green phlegm. No CP or SOB.Pt wants abx. No fever now. Pt said this is day 15 since pt began sickness. Pt did home covid test when symptoms first appeared that was neg. No available appts today at [facility] or [facility]. Pt scheduled appt with Dr Damita Dunnings on 10/02/22 at 8 AM and pt will be at [facility] at 7:45 AM. With UC & ED precautions given and pt voiced understanding.  Sending note to Dr Damita Dunnings and Damita Dunnings pool. Will also teams Butch Penny CMA back.

## 2022-10-01 NOTE — Telephone Encounter (Signed)
Patient called in and stated that he was seen at urgent care back in December for RSV. He was wanting to know if Butch Penny could give him a call because he had some concerns about getting something prescribed. He can be reached at [phone]. Please advise. Thank you!

## 2022-10-02 ENCOUNTER — Ambulatory Visit (INDEPENDENT_AMBULATORY_CARE_PROVIDER_SITE_OTHER): Payer: Medicare Other | Admitting: Family Medicine

## 2022-10-02 ENCOUNTER — Encounter: Payer: Self-pay | Admitting: Family Medicine

## 2022-10-02 VITALS — BP 108/62 | HR 55 | Temp 97.4°F | Ht 68.5 in | Wt 174.0 lb

## 2022-10-02 DIAGNOSIS — R059 Cough, unspecified: Secondary | ICD-10-CM | POA: Diagnosis not present

## 2022-10-02 MED ORDER — DOXYCYCLINE HYCLATE 100 MG PO TABS: 100.0000 mg | ORAL_TABLET | Freq: Two times a day (BID) | ORAL | 0 refills | Status: DC

## 2022-10-02 MED ORDER — FLUTICASONE PROPIONATE 50 MCG/ACT NA SUSP: 2.0000 | Freq: Every day | NASAL | 1 refills | Status: DC

## 2022-10-02 NOTE — Assessment & Plan Note (Signed)
Presumed bronchitis, okay for outpatient f/u.  Would defer imaging as well appearing and it likely wouldn't change the plan. He agrees.  Doxy, rest, fluids.  Routine cautions d/w pt.  Can use flonase if needed for L SOM. No TM erythema. He agrees.

## 2022-10-02 NOTE — Progress Notes (Signed)
Patient has had cough and congestion x 16 days. Has been taking otc meds. Took covid test in the beginning of sx and was neg.  Had mild cough.  Temp 101 initially, for about 1 day, resolved in the meantime.  Chest congestion.  Taking plenty of water.  Sputum is clear sometimes, can be discolored.  No ST.  No vomiting, no diarrhea.  Prev with more frontal pressure/tightness on the forehead.   Per HPI unless specifically indicated in ROS section   Meds, vitals, and allergies reviewed.   GEN: nad, alert and oriented HEENT: mucous membranes moist, TM w/o erythema but L SOM noted, nasal epithelium stuffy, OP without cobblestoning NECK: supple w/o LA CV: rrr. PULM: ctab except for UAN, no inc wob ABD: soft, +bs EXT: no edema

## 2022-10-02 NOTE — Telephone Encounter (Signed)
Noted.  Thanks.  Will see at office visit.

## 2022-10-12 MED ORDER — PREDNISONE 10 MG PO TABS: ORAL_TABLET | ORAL | 0 refills | Status: DC

## 2022-10-12 NOTE — Telephone Encounter (Signed)
Spoke with patient and advised on below. Patient states he will give the flonase and Claritin a chance together before starting the prednisone.

## 2022-10-12 NOTE — Addendum Note (Signed)
Addended by: Tonia Ghent on: 10/12/2022 02:10 PM   Modules accepted: Orders

## 2022-10-12 NOTE — Telephone Encounter (Signed)
Pt called stating he saw Damita Dunnings on 1/9. Pt states he still has a ongoing crackling feeling/sound within ear & is wondering if Damita Dunnings can prescribe something for that? Call back # [phone]

## 2022-10-12 NOTE — Telephone Encounter (Signed)
Is he still using flonase?  If so, continue and add on claritin '10mg'$  a day.  If still no relief with that then would take prednisone.  Rx sent for that.  But would try claritin first if not already done.

## 2022-10-25 ENCOUNTER — Telehealth: Payer: Self-pay | Admitting: Family Medicine

## 2022-10-25 DIAGNOSIS — H918X3 Other specified hearing loss, bilateral: Secondary | ICD-10-CM

## 2022-10-25 NOTE — Telephone Encounter (Signed)
Patient called and asked if he can get a referral to get his ears checked out. Call back number [phone].

## 2022-10-25 NOTE — Telephone Encounter (Signed)
Spoke with Mr. Granberg.  He state he saw Dr. Damita Dunnings 10/02/22.  His symptoms are better but he is having crackling sounds in both ears and some hearing loss.  He would like a referral to ENT to have his ears checked out.  Prefers [place].

## 2022-10-26 NOTE — Addendum Note (Signed)
Addended by: [name] on: 10/26/2022 08:54 AM   Modules accepted: Orders

## 2022-10-26 NOTE — Telephone Encounter (Signed)
done 

## 2022-10-29 NOTE — Telephone Encounter (Signed)
Referral faxed to [facility] 

## 2022-10-29 NOTE — Telephone Encounter (Signed)
Patient would like to see [facility] Dr. Tami Ribas  Prefers this location over [facility]  Patient wants Donna's thoughts on this, please call him at [phone]

## 2022-11-01 ENCOUNTER — Encounter (HOSPITAL_COMMUNITY): Payer: Self-pay | Admitting: *Deleted

## 2022-11-20 ENCOUNTER — Telehealth: Payer: Self-pay | Admitting: Family Medicine

## 2022-11-20 DIAGNOSIS — H903 Sensorineural hearing loss, bilateral: Secondary | ICD-10-CM | POA: Diagnosis not present

## 2022-11-20 DIAGNOSIS — H6983 Other specified disorders of Eustachian tube, bilateral: Secondary | ICD-10-CM | POA: Diagnosis not present

## 2022-11-20 NOTE — Telephone Encounter (Signed)
Contacted [facility] Manis to schedule their annual wellness visit. Appointment made for 12/19/2022.  [place] Direct Dial: [phone]

## 2022-12-04 ENCOUNTER — Other Ambulatory Visit: Payer: Self-pay | Admitting: Cardiology

## 2022-12-04 DIAGNOSIS — I4819 Other persistent atrial fibrillation: Secondary | ICD-10-CM

## 2022-12-04 NOTE — Telephone Encounter (Signed)
Prescription refill request for Xarelto received.  Indication: Afib  Last office visit: 06/20/22 Quentin Ore)  Weight: 78.9kg Age: 77 Scr: 1.09 (12/17/21)  CrCl: 62.68m/min  Appropriate dose. Refill sent.

## 2022-12-13 ENCOUNTER — Other Ambulatory Visit: Payer: Self-pay | Admitting: Family Medicine

## 2022-12-13 DIAGNOSIS — Z8546 Personal history of malignant neoplasm of prostate: Secondary | ICD-10-CM

## 2022-12-13 DIAGNOSIS — E782 Mixed hyperlipidemia: Secondary | ICD-10-CM

## 2022-12-13 DIAGNOSIS — Z79899 Other long term (current) drug therapy: Secondary | ICD-10-CM

## 2022-12-19 ENCOUNTER — Ambulatory Visit (INDEPENDENT_AMBULATORY_CARE_PROVIDER_SITE_OTHER): Payer: Medicare Other

## 2022-12-19 VITALS — Ht 70.0 in | Wt 172.0 lb

## 2022-12-19 DIAGNOSIS — Z0000 Encounter for general adult medical examination without abnormal findings: Secondary | ICD-10-CM | POA: Diagnosis not present

## 2022-12-19 NOTE — Progress Notes (Signed)
I connected with  Oralia Manis on 12/19/22 by a audio enabled telemedicine application and verified that I am speaking with the correct person using two identifiers.  Patient Location: Home  Provider Location: Office/Clinic  I discussed the limitations of evaluation and management by telemedicine. The patient expressed understanding and agreed to proceed.  Subjective:   Derek [name] is a 77 y.o. male who presents for Medicare Annual/Subsequent preventive examination.  Review of Systems      Cardiac Risk Factors include: advanced age (>29men, >73 women);sedentary lifestyle;male gender     Objective:    Today's Vitals   12/19/22 0826  Weight: 172 lb (78 kg)  Height: 5\' 10"  (1.778 m)   Body mass index is 24.68 kg/m.     12/19/2022    8:33 AM 12/07/2021   11:26 AM 03/17/2021    6:00 AM 12/06/2020   11:58 AM 09/07/2019   10:33 AM 08/27/2018   11:51 AM 07/05/2017    8:25 AM  Advanced Directives  Does Patient Have a Medical Advance Directive? Yes Yes Yes Yes Yes Yes Yes  Type of Paramedic of [place];Living will [place];Living will [place];Living will [place];Living will [place];Living will [place];Living will [place];Living will  Does patient want to make changes to medical advance directive? No - Patient declined  No - Patient declined      Copy of [place] in Chart? Yes - validated most recent copy scanned in chart (See row information) Yes - validated most recent copy scanned in chart (See row information) No - copy requested No - copy requested No - copy requested Yes - validated most recent copy scanned in chart (See row information) No - copy requested    Current Medications (verified) Outpatient Encounter Medications as of 12/19/2022  Medication Sig   Artificial Tear Solution (GENTEAL TEARS) 0.1-0.2-0.3  % SOLN Place 1-2 drops into both eyes 3 (three) times daily as needed (dry/irritated eyes).   Cholecalciferol (VITAMIN D) 50 MCG (2000 UT) tablet Take 2,000 Units by mouth in the morning.   omeprazole (PRILOSEC) 20 MG capsule Take 20 mg by mouth daily.   rivaroxaban (XARELTO) 20 MG TABS tablet TAKE 1 TABLET BY MOUTH DAILY WITH SUPPER.   Zinc Sulfate (ZINC 15 PO) Take 15 mg by mouth every evening.   doxycycline (VIBRA-TABS) 100 MG tablet Take 1 tablet (100 mg total) by mouth 2 (two) times daily. (Patient not taking: Reported on 12/19/2022)   fluticasone (FLONASE) 50 MCG/ACT nasal spray Place 2 sprays into both nostrils daily.   predniSONE (DELTASONE) 10 MG tablet Take 2 a day for 5 days, then 1 a day for 5 days, with food. Don't take with aleve/ibuprofen.   No facility-administered encounter medications on file as of 12/19/2022.    Allergies (verified) Flecainide   History: Past Medical History:  Diagnosis Date   Basal cell carcinoma 2009   back   Cancer of prostate ([place])    pT2c No Mx, Gleason [id number]   Chronic systolic dysfunction of left ventricle    EF 45% by echo 2/18   Diverticulitis    Kidney stones    Left rotator cuff tear 07/10/2013   Nephrolithiasis    Paroxysmal atrial fibrillation ([place])    Rheumatic fever 1952-1953   Past Surgical History:  Procedure Laterality Date   ATRIAL FIBRILLATION ABLATION N/A 02/26/2017   Procedure: Atrial Fibrillation Ablation;  Surgeon: Thompson Grayer, MD;  Location: [facility];  Service: Cardiovascular;  Laterality: N/A;   ATRIAL FIBRILLATION ABLATION N/A 03/17/2021   Procedure: ATRIAL FIBRILLATION ABLATION;  Surgeon: Thompson Grayer, MD;  Location: [facility];  Service: Cardiovascular;  Laterality: N/A;   bilateral inguinal hernia repair  07/10/2012   lap BIH repairs   CARDIOVERSION N/A 03/07/2017   Procedure: CARDIOVERSION;  Surgeon: Fay Records, MD;  Location: [facility];  Service: Cardiovascular;  Laterality: N/A;    COLONOSCOPY     KNEE ARTHROSCOPY W/ PARTIAL MEDIAL MENISCECTOMY  2005   Duda- arthroscopy, partial medial menisectomy-lt   ROBOT ASSISTED LAPAROSCOPIC RADICAL PROSTATECTOMY  12/19/06   robotic prostatectomy   rotator cuff surgery  2002   Gioffre-rt   SHOULDER ARTHROSCOPY WITH ROTATOR CUFF REPAIR AND SUBACROMIAL DECOMPRESSION Left 07/10/2013   Procedure: LEFT SHOULDER ARTHROSCOPY WITH ARTHROSCOPIC ROTATOR CUFF REPAIR AND SUBACROMIAL DECOMPRESSION, PARTIAL ACROMIOPLASTY WITH CORACROMIAL RELEASE;  Surgeon: Johnny Bridge, MD;  Location: [facility];  Service: Orthopedics;  Laterality: Left;   Family History  Problem Relation Age of Onset   Breast cancer Mother    Cancer Brother 38       brain tumor   Heart disease Other        fam hx   Arthritis Other        other relative   Prostate cancer Other        nephew   Lung cancer Other 79       nephew   Arrhythmia Father    Social History   Socioeconomic History   Marital status: Married    Spouse name: Not on file   Number of children: Not on file   Years of education: Not on file   Highest education level: Not on file  Occupational History   Occupation: retired Licensed conveyancer)    Employer: [employer]  Tobacco Use   Smoking status: Former    Types: Cigarettes    Quit date: 09/25/1971    Years since quitting: 51.2   Smokeless tobacco: Never  Vaping Use   Vaping Use: Never used  Substance and Sexual Activity   Alcohol use: No   Drug use: No   Sexual activity: Not on file  Other Topics Concern   Not on file  Social History Narrative   Regular exercise: yes - runner 5d/wk, former marathon runner   Diet: no red meat, good water, fruits/vegetables daily    Social Determinants of Health   Financial Resource Strain: Low Risk  (12/19/2022)   Overall Financial Resource Strain (CARDIA)    Difficulty of Paying Living Expenses: Not hard at all  Food Insecurity: No Food Insecurity (12/19/2022)   Hunger Vital  Sign    Worried About Running Out of Food in the Last Year: Never true    [place] in the Last Year: Never true  Transportation Needs: No Transportation Needs (12/19/2022)   PRAPARE - Hydrologist (Medical): No    Lack of Transportation (Non-Medical): No  Physical Activity: Inactive (12/19/2022)   Exercise Vital Sign    Days of Exercise per Week: 0 days    Minutes of Exercise per Session: 0 min  Stress: No Stress Concern Present (12/19/2022)   [place]    Feeling of Stress : Not at all  Social Connections: Moderately Integrated (12/19/2022)   Social Connection and Isolation Panel [NHANES]  Frequency of Communication with Friends and Family: More than three times a week    Frequency of Social Gatherings with Friends and Family: More than three times a week    Attends Religious Services: More than 4 times per year    Active Member of [employer] or Organizations: No    Attends Music therapist: Never    Marital Status: Married    Tobacco Counseling Counseling given: Not Answered   Clinical Intake:  Pre-visit preparation completed: Yes  Pain : No/denies pain     Nutritional Risks: None Diabetes: No  How often do you need to have someone help you when you read instructions, pamphlets, or other written materials from your doctor or pharmacy?: 1 - Never  Diabetic? no  Interpreter Needed?: No  Information entered by :: C.Mykell Rawl LPN   Activities of Daily Living    12/19/2022    8:33 AM  In your present state of health, do you have any difficulty performing the following activities:  Hearing? 0  Vision? 0  Difficulty concentrating or making decisions? 0  Walking or climbing stairs? 0  Dressing or bathing? 0  Doing errands, shopping? 0  Preparing Food and eating ? N  Using the Toilet? N  In the past six months, have you accidently leaked urine? Y  Comment  Occasionally  Do you have problems with loss of bowel control? N  Managing your Medications? N  Managing your Finances? N  Housekeeping or managing your Housekeeping? N    Patient Care Team: [name], MD as PCP - General Vickie Epley, MD as PCP - Electrophysiology (Cardiology) Raynelle Bring, MD as Consulting Physician (Urology) Danella Sensing, MD as Consulting Physician (Dermatology) Rutherford Guys, MD as Consulting Physician (Ophthalmology) Thompson Grayer, MD (Inactive) as Consulting Physician (Cardiology) Javier Docker. Dalbert Mayotte., DMD as Referring Physician (Dentistry)  Indicate any recent Medical Services you may have received from other than [facility] providers in the past year (date may be approximate).     Assessment:   This is a routine wellness examination for Adib.  Hearing/Vision screen Hearing Screening - Comments:: No aids Vision Screening - Comments:: No glasses  Dietary issues and exercise activities discussed: Current Exercise Habits: The patient does not participate in regular exercise at present, Exercise limited by: None identified   Goals Addressed             This Visit's Progress    Patient Stated       Walk 3 miles a day.       Depression Screen    12/19/2022    8:31 AM 12/07/2021   11:28 AM 12/06/2020   11:59 AM 09/07/2019   10:34 AM 08/27/2018   11:52 AM 07/05/2017    8:27 AM 07/02/2016    2:38 PM  PHQ 2/9 Scores  PHQ - 2 Score 0 0 0 0 0 0 0  PHQ- 9 Score   0 0 0 0     Fall Risk    12/19/2022    8:28 AM 12/07/2021   11:28 AM 12/06/2020   11:58 AM 09/07/2019   10:34 AM 08/27/2018   11:52 AM  Fall Risk   Falls in the past year? 0 0 0 0 0  Number falls in past yr: 0 0 0 0   Injury with Fall? 0 0 0 0   Risk for fall due to : No Fall Risks No Fall Risks No Fall Risks Medication side effect   Follow up Falls prevention  discussed;Falls evaluation completed Falls prevention discussed Falls evaluation completed;Falls prevention  discussed Falls evaluation completed;Falls prevention discussed     FALL RISK PREVENTION PERTAINING TO THE HOME:  Any stairs in or around the home? Yes  If so, are there any without handrails? Yes  Home free of loose throw rugs in walkways, pet beds, electrical cords, etc? Yes  Adequate lighting in your home to reduce risk of falls? Yes   ASSISTIVE DEVICES UTILIZED TO PREVENT FALLS:  Life alert? No  Use of a cane, walker or w/c? No  Grab bars in the bathroom? No  Shower chair or bench in shower? No  Elevated toilet seat or a handicapped toilet? Yes    Cognitive Function:    12/06/2020   12:03 PM 09/07/2019   10:37 AM 08/27/2018   11:52 AM 07/05/2017    8:50 AM  MMSE - Mini Mental State Exam  Not completed: Refused     Orientation to time  5 5 5   Orientation to Place  5 5 5   Registration  3 3 3   Attention/ Calculation  5 0 0  Recall  3 3 3   Language- name 2 objects   0 0  Language- repeat  1 1 1   Language- follow 3 step command   3 3  Language- read & follow direction   0 0  Write a sentence   0 0  Copy design   0 0  Total score   20 20        12/19/2022    8:35 AM  6CIT Screen  What Year? 0 points  What month? 0 points  What time? 0 points  Count back from 20 0 points  Months in reverse 0 points  Repeat phrase 0 points  Total Score 0 points    Immunizations Immunization History  Administered Date(s) Administered   Fluad Quad(high Dose 65+) 07/07/2019, 07/18/2022   Influenza, High Dose Seasonal PF 08/21/2021   Influenza,inj,Quad PF,6+ Mos 06/25/2013, 06/16/2014, 07/21/2015, 07/02/2016, 07/22/2017, 08/27/2018   Influenza-Unspecified 08/22/2020   PFIZER(Purple Top)SARS-COV-2 Vaccination 10/13/2019, 11/03/2019, 06/30/2020   Pneumococcal Conjugate-13 05/03/2015   Pneumococcal Polysaccharide-23 08/14/2006, 06/25/2013   Tdap 12/22/2014   Zoster Recombinat (Shingrix) 07/08/2018, 09/09/2018   Zoster, Live 08/14/2006    TDAP status: Up to date  Flu Vaccine  status: Up to date  Pneumococcal vaccine status: Up to date  Covid-19 vaccine status: Declined, Education has been provided regarding the importance of this vaccine but patient still declined. Advised may receive this vaccine at local pharmacy or Health Dept.or vaccine clinic. Aware to provide a copy of the vaccination record if obtained from local pharmacy or Health Dept. Verbalized acceptance and understanding.  Qualifies for Shingles Vaccine? Yes   Zostavax completed Yes   Shingrix Completed?: Yes  Screening Tests Health Maintenance  Topic Date Due   COVID-19 Vaccine (4 - 2023-24 season) 05/25/2022   COLONOSCOPY (Pts 45-64yrs Insurance coverage will need to be confirmed)  07/02/2023   Medicare Annual Wellness (AWV)  12/19/2023   DTaP/Tdap/Td (2 - Td or Tdap) 12/21/2024   Pneumonia Vaccine 17+ Years old  Completed   INFLUENZA VACCINE  Completed   Hepatitis C Screening  Completed   Zoster Vaccines- Shingrix  Completed   HPV VACCINES  Aged Out    Health Maintenance  Health Maintenance Due  Topic Date Due   COVID-19 Vaccine (4 - 2023-24 season) 05/25/2022    Colorectal cancer screening: Type of screening: Colonoscopy. Completed 07/01/2018. Repeat every 5 years  Lung Cancer Screening: (Low Dose CT Chest recommended if Age 67-80 years, 30 pack-year currently smoking OR have quit w/in 15years.) does not qualify.   Lung Cancer Screening Referral: no  Additional Screening:  Hepatitis C Screening: does qualify; Completed 06/25/16  Vision Screening: Recommended annual ophthalmology exams for early detection of glaucoma and other disorders of the eye. Is the patient up to date with their annual eye exam?  Yes  Who is the provider or what is the name of the office in which the patient attends annual eye exams? Dr.Lyles next appt. 04/08/23 If pt is not established with a provider, would they like to be referred to a provider to establish care? No .   Dental Screening: Recommended  annual dental exams for proper oral hygiene  Community Resource Referral / Chronic Care Management: CRR required this visit?  No   CCM required this visit?  No      Plan:     I have personally reviewed and noted the following in the patient's chart:   Medical and social history Use of alcohol, tobacco or illicit drugs  Current medications and supplements including opioid prescriptions. Patient is not currently taking opioid prescriptions. Functional ability and status Nutritional status Physical activity Advanced directives List of other physicians Hospitalizations, surgeries, and ER visits in previous 12 months Vitals Screenings to include cognitive, depression, and falls Referrals and appointments  In addition, I have reviewed and discussed with patient certain preventive protocols, quality metrics, and best practice recommendations. A written personalized care plan for preventive services as well as general preventive health recommendations were provided to patient.     Lebron Conners, LPN   [id number]   Nurse Notes: none

## 2022-12-19 NOTE — Patient Instructions (Signed)
Derek [name] , Thank you for taking time to come for your Medicare Wellness Visit. I appreciate your ongoing commitment to your health goals. Please review the following plan we discussed and let me know if I can assist you in the future.   These are the goals we discussed:  Goals      Increase physical activity     Starting 08/27/2018, I will continue to exercise for at least 70 minutes 4-5 days per week.     Patient Stated     09/07/2019, I will maintain and continue exercising.      Patient Stated     12/06/2020, I will continue to walk 3 days a week for 3 miles.      Patient Stated     Would like to maintain current routine      Patient Stated     Walk 3 miles a day.        This is a list of the screening recommended for you and due dates:  Health Maintenance  Topic Date Due   COVID-19 Vaccine (4 - 2023-24 season) 05/25/2022   Colon Cancer Screening  07/02/2023   Medicare Annual Wellness Visit  12/19/2023   DTaP/Tdap/Td vaccine (2 - Td or Tdap) 12/21/2024   Pneumonia Vaccine  Completed   Flu Shot  Completed   Hepatitis C Screening: USPSTF Recommendation to screen - Ages 18-79 yo.  Completed   Zoster (Shingles) Vaccine  Completed   HPV Vaccine  Aged Out    Advanced directives: Copy in the chart.  Conditions/risks identified: Aim for 30 minutes of exercise or brisk walking, 6-8 glasses of water, and 5 servings of fruits and vegetables each day.   Next appointment: Follow up in one year for your annual wellness visit. 12/23/23 @ 1:30 telephone visit.  Preventive Care 16 Years and Older, Male  Preventive care refers to lifestyle choices and visits with your health care provider that can promote health and wellness. What does preventive care include? A yearly physical exam. This is also called an annual well check. Dental exams once or twice a year. Routine eye exams. Ask your health care provider how often you should have your eyes checked. Personal lifestyle choices,  including: Daily care of your teeth and gums. Regular physical activity. Eating a healthy diet. Avoiding tobacco and drug use. Limiting alcohol use. Practicing safe sex. Taking low doses of aspirin every day. Taking vitamin and mineral supplements as recommended by your health care provider. What happens during an annual well check? The services and screenings done by your health care provider during your annual well check will depend on your age, overall health, lifestyle risk factors, and family history of disease. Counseling  Your health care provider may ask you questions about your: Alcohol use. Tobacco use. Drug use. Emotional well-being. Home and relationship well-being. Sexual activity. Eating habits. History of falls. Memory and ability to understand (cognition). Work and work Statistician. Screening  You may have the following tests or measurements: Height, weight, and BMI. Blood pressure. Lipid and cholesterol levels. These may be checked every 5 years, or more frequently if you are over 26 years old. Skin check. Lung cancer screening. You may have this screening every year starting at age 74 if you have a 30-pack-year history of smoking and currently smoke or have quit within the past 15 years. Fecal occult blood test (FOBT) of the stool. You may have this test every year starting at age 4. Flexible sigmoidoscopy or colonoscopy.  You may have a sigmoidoscopy every 5 years or a colonoscopy every 10 years starting at age 53. Prostate cancer screening. Recommendations will vary depending on your family history and other risks. Hepatitis C blood test. Hepatitis B blood test. Sexually transmitted disease (STD) testing. Diabetes screening. This is done by checking your blood sugar (glucose) after you have not eaten for a while (fasting). You may have this done every 1-3 years. Abdominal aortic aneurysm (AAA) screening. You may need this if you are a current or former  smoker. Osteoporosis. You may be screened starting at age 92 if you are at high risk. Talk with your health care provider about your test results, treatment options, and if necessary, the need for more tests. Vaccines  Your health care provider may recommend certain vaccines, such as: Influenza vaccine. This is recommended every year. Tetanus, diphtheria, and acellular pertussis (Tdap, Td) vaccine. You may need a Td booster every 10 years. Zoster vaccine. You may need this after age 58. Pneumococcal 13-valent conjugate (PCV13) vaccine. One dose is recommended after age 63. Pneumococcal polysaccharide (PPSV23) vaccine. One dose is recommended after age 32. Talk to your health care provider about which screenings and vaccines you need and how often you need them. This information is not intended to replace advice given to you by your health care provider. Make sure you discuss any questions you have with your health care provider. Document Released: 10/07/2015 Document Revised: 05/30/2016 Document Reviewed: 07/12/2015 Elsevier Interactive Patient Education  2017 [place] Prevention in the Home Falls can cause injuries. They can happen to people of all ages. There are many things you can do to make your home safe and to help prevent falls. What can I do on the outside of my home? Regularly fix the edges of walkways and driveways and fix any cracks. Remove anything that might make you trip as you walk through a door, such as a raised step or threshold. Trim any bushes or trees on the path to your home. Use bright outdoor lighting. Clear any walking paths of anything that might make someone trip, such as rocks or tools. Regularly check to see if handrails are loose or broken. Make sure that both sides of any steps have handrails. Any raised decks and porches should have guardrails on the edges. Have any leaves, snow, or ice cleared regularly. Use sand or salt on walking paths during  winter. Clean up any spills in your garage right away. This includes oil or grease spills. What can I do in the bathroom? Use night lights. Install grab bars by the toilet and in the tub and shower. Do not use towel bars as grab bars. Use non-skid mats or decals in the tub or shower. If you need to sit down in the shower, use a plastic, non-slip stool. Keep the floor dry. Clean up any water that spills on the floor as soon as it happens. Remove soap buildup in the tub or shower regularly. Attach bath mats securely with double-sided non-slip rug tape. Do not have throw rugs and other things on the floor that can make you trip. What can I do in the bedroom? Use night lights. Make sure that you have a light by your bed that is easy to reach. Do not use any sheets or blankets that are too big for your bed. They should not hang down onto the floor. Have a firm chair that has side arms. You can use this for support while you  get dressed. Do not have throw rugs and other things on the floor that can make you trip. What can I do in the kitchen? Clean up any spills right away. Avoid walking on wet floors. Keep items that you use a lot in easy-to-reach places. If you need to reach something above you, use a strong step stool that has a grab bar. Keep electrical cords out of the way. Do not use floor polish or wax that makes floors slippery. If you must use wax, use non-skid floor wax. Do not have throw rugs and other things on the floor that can make you trip. What can I do with my stairs? Do not leave any items on the stairs. Make sure that there are handrails on both sides of the stairs and use them. Fix handrails that are broken or loose. Make sure that handrails are as long as the stairways. Check any carpeting to make sure that it is firmly attached to the stairs. Fix any carpet that is loose or worn. Avoid having throw rugs at the top or bottom of the stairs. If you do have throw rugs,  attach them to the floor with carpet tape. Make sure that you have a light switch at the top of the stairs and the bottom of the stairs. If you do not have them, ask someone to add them for you. What else can I do to help prevent falls? Wear shoes that: Do not have high heels. Have rubber bottoms. Are comfortable and fit you well. Are closed at the toe. Do not wear sandals. If you use a stepladder: Make sure that it is fully opened. Do not climb a closed stepladder. Make sure that both sides of the stepladder are locked into place. Ask someone to hold it for you, if possible. Clearly mark and make sure that you can see: Any grab bars or handrails. First and last steps. Where the edge of each step is. Use tools that help you move around (mobility aids) if they are needed. These include: Canes. Walkers. Scooters. Crutches. Turn on the lights when you go into a dark area. Replace any light bulbs as soon as they burn out. Set up your furniture so you have a clear path. Avoid moving your furniture around. If any of your floors are uneven, fix them. If there are any pets around you, be aware of where they are. Review your medicines with your doctor. Some medicines can make you feel dizzy. This can increase your chance of falling. Ask your doctor what other things that you can do to help prevent falls. This information is not intended to replace advice given to you by your health care provider. Make sure you discuss any questions you have with your health care provider. Document Released: 07/07/2009 Document Revised: 02/16/2016 Document Reviewed: 10/15/2014 Elsevier Interactive Patient Education  2017 [employer].

## 2022-12-24 ENCOUNTER — Other Ambulatory Visit (INDEPENDENT_AMBULATORY_CARE_PROVIDER_SITE_OTHER): Payer: Medicare Other

## 2022-12-24 DIAGNOSIS — E782 Mixed hyperlipidemia: Secondary | ICD-10-CM

## 2022-12-24 DIAGNOSIS — Z79899 Other long term (current) drug therapy: Secondary | ICD-10-CM | POA: Diagnosis not present

## 2022-12-24 DIAGNOSIS — Z8546 Personal history of malignant neoplasm of prostate: Secondary | ICD-10-CM

## 2022-12-24 LAB — HEPATIC FUNCTION PANEL
ALT: 15 U/L (ref 0–53)
AST: 23 U/L (ref 0–37)
Albumin: 4.2 g/dL (ref 3.5–5.2)
Alkaline Phosphatase: 76 U/L (ref 39–117)
Bilirubin, Direct: 0.1 mg/dL (ref 0.0–0.3)
Total Bilirubin: 0.6 mg/dL (ref 0.2–1.2)
Total Protein: 6.7 g/dL (ref 6.0–8.3)

## 2022-12-24 LAB — BASIC METABOLIC PANEL
BUN: 14 mg/dL (ref 6–23)
CO2: 29 mEq/L (ref 19–32)
Calcium: 9.3 mg/dL (ref 8.4–10.5)
Chloride: 107 mEq/L (ref 96–112)
Creatinine, Ser: 1 mg/dL (ref 0.40–1.50)
GFR: 71.9 mL/min (ref >60.00)
Glucose, Bld: 88 mg/dL (ref 70–99)
Potassium: 4 mEq/L (ref 3.5–5.1)
Sodium: 138 mEq/L (ref 135–145)

## 2022-12-24 LAB — CBC WITH DIFFERENTIAL/PLATELET
Basophils Absolute: 0 10*3/uL (ref 0.0–0.1)
Basophils Relative: 0.9 % (ref 0.0–3.0)
Eosinophils Absolute: 0.2 10*3/uL (ref 0.0–0.7)
Eosinophils Relative: 3.4 % (ref 0.0–5.0)
HCT: 41.9 % (ref 39.0–52.0)
Hemoglobin: 14.4 g/dL (ref 13.0–17.0)
Lymphocytes Relative: 29 % (ref 12.0–46.0)
Lymphs Abs: 1.5 10*3/uL (ref 0.7–4.0)
MCHC: 34.3 g/dL (ref 30.0–36.0)
MCV: 93.5 fl (ref 78.0–100.0)
Monocytes Absolute: 0.3 10*3/uL (ref 0.1–1.0)
Monocytes Relative: 6.7 % (ref 3.0–12.0)
Neutro Abs: 3.1 10*3/uL (ref 1.4–7.7)
Neutrophils Relative %: 60 % (ref 43.0–77.0)
Platelets: 231 10*3/uL (ref 150.0–400.0)
RBC: 4.49 Mil/uL (ref 4.22–5.81)
RDW: 14 % (ref 11.5–15.5)
WBC: 5.2 10*3/uL (ref 4.0–10.5)

## 2022-12-24 LAB — LIPID PANEL
Cholesterol: 194 mg/dL (ref 0–200)
HDL: 59.5 mg/dL (ref >39.00)
LDL Cholesterol: 120 mg/dL — ABNORMAL HIGH (ref 0–99)
NonHDL: 134.64
Total CHOL/HDL Ratio: 3
Triglycerides: 71 mg/dL (ref 0.0–149.0)
VLDL: 14.2 mg/dL (ref 0.0–40.0)

## 2022-12-26 LAB — PSA, TOTAL WITH REFLEX TO PSA, FREE: PSA, Total: 0.1 ng/mL (ref <=4.0)

## 2022-12-30 NOTE — Progress Notes (Unsigned)
    Derek [name] T. Derek Goding, MD, CAQ Sports Medicine [facility] at [facility] [address] [place], [address]  Phone: [phone]  FAX: [phone]  Derek [name] - 78 y.o. male  MRN [id number]  Date of Birth: 1946-08-28  Date: 12/31/2022  PCP: Hannah Beat, MD  Referral: Hannah Beat, MD  No chief complaint on file.  Subjective:   Derek [name] is a 77 y.o. very pleasant male patient with There is no height or weight on file to calculate BMI. who presents with the following:  [name] presents for chronic medical follow-up after his Medicare wellness exam.  He does have a history of prostate cancer and history of A-fib.  He does take Xarelto chronically for his A-fib.  New Covid booster    Health Maintenance  Topic Date Due   COVID-19 Vaccine (4 - 2023-24 season) 05/25/2022   INFLUENZA VACCINE  04/25/2023   COLONOSCOPY (Pts 45-46yrs Insurance coverage will need to be confirmed)  07/02/2023   Medicare Annual Wellness (AWV)  12/19/2023   DTaP/Tdap/Td (2 - Td or Tdap) 12/21/2024   Pneumonia Vaccine 67+ Years old  Completed   Hepatitis C Screening  Completed   Zoster Vaccines- Shingrix  Completed   HPV VACCINES  Aged Out    Immunization History  Administered Date(s) Administered   Fluad Quad(high Dose 65+) 07/07/2019, 07/18/2022   Influenza, High Dose Seasonal PF 08/21/2021   Influenza,inj,Quad PF,6+ Mos 06/25/2013, 06/16/2014, 07/21/2015, 07/02/2016, 07/22/2017, 08/27/2018   Influenza-Unspecified 08/22/2020   PFIZER(Purple Top)SARS-COV-2 Vaccination 10/13/2019, 11/03/2019, 06/30/2020   Pneumococcal Conjugate-13 05/03/2015   Pneumococcal Polysaccharide-23 08/14/2006, 06/25/2013   Tdap 12/22/2014   Zoster Recombinat (Shingrix) 07/08/2018, 09/09/2018   Zoster, Live 08/14/2006     Review of Systems is noted in the HPI, as appropriate  Objective:   There were no vitals taken for this visit.  GEN: No acute distress;  alert,appropriate. PULM: Breathing comfortably in no respiratory distress PSYCH: Normally interactive.   Laboratory and Imaging Data:  Assessment and Plan:   ***

## 2022-12-31 ENCOUNTER — Ambulatory Visit (INDEPENDENT_AMBULATORY_CARE_PROVIDER_SITE_OTHER): Payer: Medicare Other | Admitting: Family Medicine

## 2022-12-31 ENCOUNTER — Encounter: Payer: Self-pay | Admitting: Family Medicine

## 2022-12-31 VITALS — BP 100/60 | HR 58 | Temp 97.5°F | Ht 68.5 in | Wt 173.5 lb

## 2022-12-31 DIAGNOSIS — Z8546 Personal history of malignant neoplasm of prostate: Secondary | ICD-10-CM

## 2022-12-31 DIAGNOSIS — Z8679 Personal history of other diseases of the circulatory system: Secondary | ICD-10-CM

## 2023-01-04 ENCOUNTER — Telehealth: Payer: Self-pay | Admitting: Cardiology

## 2023-01-04 NOTE — Telephone Encounter (Signed)
Pt is requesting call back to discuss appt. He states that he just had blood work done and believes the 6 mo appt may not be needed, but would like to discuss that with a nurse. Please advise.

## 2023-01-04 NOTE — Telephone Encounter (Signed)
Returned call to patient to discuss follow-up appt. Patient is due for 6 month F/U with EP APP this month. Patient states he recently had labwork done and saw his PCP Dr. Dallas Schimke on 12/31/22 and states he is doing well.   Patient requests to hold off on follow-up visit until October 2024 with either Dr. Lalla Brothers or EP APP.  Will forward to Dr. Lalla Brothers and his nurse to review. Informed patient that if there were any concerns regarding postponing F/U visit we will let him know.

## 2023-01-09 ENCOUNTER — Ambulatory Visit: Payer: Medicare Other | Admitting: Student

## 2023-01-11 NOTE — Telephone Encounter (Signed)
Left detailed message informing pt ok to postpone follow up with Dr. Lalla Brothers until October.  Pt aware letter will be mailed when time to schedule an appointment.  Recall placed in epic

## 2023-01-14 DIAGNOSIS — M5451 Vertebrogenic low back pain: Secondary | ICD-10-CM | POA: Diagnosis not present

## 2023-01-14 DIAGNOSIS — M545 Low back pain, unspecified: Secondary | ICD-10-CM | POA: Insufficient documentation

## 2023-01-21 DIAGNOSIS — M4316 Spondylolisthesis, lumbar region: Secondary | ICD-10-CM | POA: Diagnosis not present

## 2023-01-21 DIAGNOSIS — M5136 Other intervertebral disc degeneration, lumbar region: Secondary | ICD-10-CM | POA: Diagnosis not present

## 2023-01-21 DIAGNOSIS — M5451 Vertebrogenic low back pain: Secondary | ICD-10-CM | POA: Diagnosis not present

## 2023-04-03 DIAGNOSIS — S60454A Superficial foreign body of right ring finger, initial encounter: Secondary | ICD-10-CM | POA: Diagnosis not present

## 2023-04-03 DIAGNOSIS — M79644 Pain in right finger(s): Secondary | ICD-10-CM | POA: Diagnosis not present

## 2023-04-17 ENCOUNTER — Telehealth: Payer: Self-pay | Admitting: Cardiology

## 2023-04-17 NOTE — Telephone Encounter (Signed)
Patient c/o Palpitations:  STAT if patient reporting lightheadedness, shortness of breath, or chest pain  How long have you had palpitations/irregular HR/ Afib? Are you having the symptoms now?   Yes  Are you currently experiencing lightheadedness, SOB or CP?  Dull headache  Do you have a history of afib (atrial fibrillation) or irregular heart rhythm?  Yes  Have you checked your BP or HR? (document readings if available):  Patient stated HR 60's then shoot up to 70-80 and then come back down  Are you experiencing any other symptoms?  No   Patient stated he has been having irregular heart beats and he has some outdated metoprolol tartrate (LOPRESSOR) 25 MG tablet and wants to know if he should take this medication.

## 2023-04-17 NOTE — Telephone Encounter (Signed)
Spoke with pt who reports his current pulse according to pulse ox is erratic between the 60-'s -80's.  Pt states when he manually feels pulse it does feel regular.  Does not have a BP cuff at home for monitoring.  Pt reports he believes he might be in Afib as his normal HR is 50's-60's.  He denies current CP, SOB or dizziness.  Pt states he does have expired Metoprolol and asks if he should try this to help stabilize his pulse.  Pt advised Metoprolol was discontinued and he should not take this medication.   Nurse visit scheduled for ECG on 04/18/2023 at 11am to confirm Atrial Fib.  Reviewed ED precautions.  Pt verbalizes understanding and agrees with current plan.

## 2023-04-17 NOTE — Telephone Encounter (Signed)
Left message for patient to call back  

## 2023-04-18 ENCOUNTER — Encounter: Payer: Self-pay | Admitting: Internal Medicine

## 2023-04-18 ENCOUNTER — Ambulatory Visit: Payer: Medicare Other | Attending: Cardiovascular Disease

## 2023-04-18 ENCOUNTER — Ambulatory Visit (INDEPENDENT_AMBULATORY_CARE_PROVIDER_SITE_OTHER): Payer: Medicare Other | Admitting: Internal Medicine

## 2023-04-18 VITALS — HR 66 | Ht 68.5 in

## 2023-04-18 VITALS — BP 108/78 | HR 64 | Temp 97.8°F | Ht 68.5 in | Wt 170.0 lb

## 2023-04-18 DIAGNOSIS — R1030 Lower abdominal pain, unspecified: Secondary | ICD-10-CM | POA: Diagnosis not present

## 2023-04-18 DIAGNOSIS — I4819 Other persistent atrial fibrillation: Secondary | ICD-10-CM | POA: Insufficient documentation

## 2023-04-18 NOTE — Progress Notes (Signed)
   Nurse Visit   Date of Encounter: 04/18/2023 ID: ALMER BUSHEY, DOB 03/03/1946, MRN [id number]  PCP:  Hannah Beat, MD   [facility] Providers Cardiologist:  None Electrophysiologist:  Lanier Prude, MD      Visit Details   VS:  Pulse 66   Ht 5' 8.5" (1.74 m)   BMI 26.00 kg/m  , BMI Body mass index is 26 kg/m.  Wt Readings from Last 3 Encounters:  12/31/22 173 lb 8 oz (78.7 kg)  12/19/22 172 lb (78 kg)  10/02/22 174 lb (78.9 kg)     Reason for visit: EKG to see if patient is in Atrial fibrillation Performed today: EKG, Provider consulted:Dr. Mealor, and Education Changes (medications, testing, etc.) : None Length of Visit: 20 minutes  EKG reviewed by Dr. Nelly Laurence. EKG shows sinus rhythm with PAC's. No changes  Medications Adjustments/Labs and Tests Ordered: Orders Placed This Encounter  Procedures   EKG 12-Lead   No orders of the defined types were placed in this encounter.    Margart Sickles, RN  04/18/2023 11:05 AM

## 2023-04-18 NOTE — Assessment & Plan Note (Signed)
Not sick and eating okay---so doubt appendicitis (especially since bilateral). Unlikely to be diverticulitis--since bilateral. Most likely is deep strain but didn't have any injury Continue to eat light Tylenol---and try heating pad If worsens, to ER to get CT scan

## 2023-04-18 NOTE — Progress Notes (Signed)
Subjective:    Patient ID: Derek [name], male    DOB: 08-07-1946, 77 y.o.   MRN: [id number]  HPI Here due to abdominal pain  Having pain bilaterally below umbilicus Started 3 days ago Tried pepto for indigestion feeling Later that night--tried 2 tums Also took a prilosec one morning  2 days ago--had 2 stools (AM and mid afternoon)---slightly loose None since then No blood Usually goes daily  Remembers hospitalization 10-15 years ago Sounds like partial small bowel obstruction (NG tube, etc) This isn't that bad but he was concerned  Still has the pain-- "uncomfortable" as opposed to true pain No change with position or walking around No change with eating  Did do yard work--mowing before this. Nothing he doesn't always do  Current Outpatient Medications on File Prior to Visit  Medication Sig Dispense Refill   Artificial Tear Solution (GENTEAL TEARS) 0.1-0.2-0.3 % SOLN Place 1-2 drops into both eyes 3 (three) times daily as needed (dry/irritated eyes).     Cholecalciferol (VITAMIN D) 50 MCG (2000 UT) tablet Take 2,000 Units by mouth in the morning.     omeprazole (PRILOSEC) 20 MG capsule Take 20 mg by mouth as needed.     rivaroxaban (XARELTO) 20 MG TABS tablet TAKE 1 TABLET BY MOUTH DAILY WITH SUPPER. 90 tablet 1   Zinc Sulfate (ZINC 15 PO) Take 15 mg by mouth every evening.     No current facility-administered medications on file prior to visit.    Allergies  Allergen Reactions   Flecainide Other (See Comments)    Dizziness and fatigue    Past Medical History:  Diagnosis Date   Basal cell carcinoma 2009   back   Cancer of prostate (HCC)    pT2c No Mx, Gleason 3+4=7   Chronic systolic dysfunction of left ventricle    EF 45% by echo 2/18   Diverticulitis    Kidney stones    Left rotator cuff tear 07/10/2013   Nephrolithiasis    Paroxysmal atrial fibrillation (HCC)    Rheumatic fever 1952-1953    Past Surgical History:  Procedure Laterality Date    ATRIAL FIBRILLATION ABLATION N/A 02/26/2017   Procedure: Atrial Fibrillation Ablation;  Surgeon: Hillis Range, MD;  Location: [facility] IN[facility]ASIVE CV [facility];  Service: Cardiovascular;  Laterality: N/A;   ATRIAL FIBRILLATION ABLATION N/A 03/17/2021   Procedure: ATRIAL FIBRILLATION ABLATION;  Surgeon: Hillis Range, MD;  Location: [facility] INVASIVE CV LAB;  Service: Cardiovascular;  Laterality: N/A;   bilateral inguinal hernia repair  07/10/2012   lap BIH repairs   CARDIOVERSION N/A 03/07/2017   Procedure: CARDIOVERSION;  Surgeon: Pricilla Riffle, MD;  Location: [facility] ENDOSCOPY;  Service: Cardiovascular;  Laterality: N/A;   COLONOSCOPY     KNEE ARTHROSCOPY W/ PARTIAL MEDIAL MENISCECTOMY  2005   Duda- arthroscopy, partial medial menisectomy-lt   ROBOT ASSISTED LAPAROSCOPIC RADICAL PROSTATECTOMY  12/19/06   robotic prostatectomy   rotator cuff surgery  2002   Gioffre-rt   SHOULDER ARTHROSCOPY WITH ROTATOR CUFF REPAIR AND SUBACROMIAL DECOMPRESSION Left 07/10/2013   Procedure: LEFT SHOULDER ARTHROSCOPY WITH ARTHROSCOPIC ROTATOR CUFF REPAIR AND SUBACROMIAL DECOMPRESSION, PARTIAL ACROMIOPLASTY WITH CORACROMIAL RELEASE;  Surgeon: Eulas Post, MD;  Location: [facility];  Service: Orthopedics;  Laterality: Left;    Family History  Problem Relation Age of Onset   Breast cancer Mother    Cancer Brother 84       brain tumor   Heart disease Other        fam hx  Arthritis Other        other relative   Prostate cancer Other        nephew   Lung cancer Other 71       nephew   Arrhythmia Father     Social History   Socioeconomic History   Marital status: Married    Spouse name: Not on file   Number of children: Not on file   Years of education: Not on file   Highest education level: Associate degree: academic program  Occupational History   Occupation: retired Systems analyst)    Employer: [employer]  Tobacco Use   Smoking status: Former    Current packs/day: 0.00    Types: Cigarettes     Quit date: 09/25/1971    Years since quitting: 51.5   Smokeless tobacco: Never  Vaping Use   Vaping status: Never Used  Substance and Sexual Activity   Alcohol use: No   Drug use: No   Sexual activity: Not on file  Other Topics Concern   Not on file  Social History Narrative   Regular exercise: yes - runner 5d/wk, former marathon runner   Diet: no red meat, good water, fruits/vegetables daily    Social Determinants of Health   Financial Resource Strain: Low Risk  (04/18/2023)   Overall Financial Resource Strain (CARDIA)    Difficulty of Paying Living Expenses: Not hard at all  Food Insecurity: No Food Insecurity (04/18/2023)   Hunger Vital Sign    Worried About Running Out of Food in the Last Year: Never true    Ran Out of Food in the Last Year: Never true  Transportation Needs: No Transportation Needs (04/18/2023)   PRAPARE - Administrator, Civil Service (Medical): No    Lack of Transportation (Non-Medical): No  Physical Activity: Insufficiently Active (04/18/2023)   Exercise Vital Sign    Days of Exercise per Week: 2 days    Minutes of Exercise per Session: 20 min  Stress: No Stress Concern Present (04/18/2023)   [employer] of Occupational Health - Occupational Stress Questionnaire    Feeling of Stress : Not at all  Social Connections: Socially Integrated (04/18/2023)   Social Connection and Isolation Panel [NHANES]    Frequency of Communication with Friends and Family: More than three times a week    Frequency of Social Gatherings with Friends and Family: Three times a week    Attends Religious Services: More than 4 times per year    Active Member of Clubs or Organizations: Yes    Attends Banker Meetings: More than 4 times per year    Marital Status: Married  Catering manager Violence: Not At Risk (12/19/2022)   Humiliation, Afraid, Rape, and Kick questionnaire    Fear of Current or Ex-Partner: No    Emotionally Abused: No    Physically  Abused: No    Sexually Abused: No   Review of Systems No fever No N/V Still eating okay---just eating light No dysuria or trouble voiding No cough or SOB    Objective:   Physical Exam Constitutional:      Appearance: He is well-developed.  Cardiovascular:     Rate and Rhythm: Normal rate and regular rhythm.     Heart sounds: No murmur heard.    No gallop.  Pulmonary:     Effort: Pulmonary effort is normal.     Breath sounds: Normal breath sounds. No wheezing or rales.  Abdominal:  General: Abdomen is scaphoid. There is no distension.     Palpations: Abdomen is soft. There is no mass.     Tenderness: There is no guarding.     Comments: Has some tenderness in both lower quadrants--close to inguinal area. No hernia though  Genitourinary:    Testes: Normal.     Comments: No hernia Musculoskeletal:     Cervical back: Neck supple.  Lymphadenopathy:     Cervical: No cervical adenopathy.  Neurological:     Mental Status: He is alert.            Assessment & Plan:

## 2023-06-20 ENCOUNTER — Telehealth: Payer: Self-pay | Admitting: Family Medicine

## 2023-06-20 NOTE — Telephone Encounter (Signed)
Called and advised patient PCP is out of office today and tomorrow. Offered patient virtual visit for tomorrow due to patient not wanting to get out in the weather. Patient declined and stated if he was still feeling bad next week he would call and schedule appt.

## 2023-06-20 NOTE — Telephone Encounter (Signed)
Patient contacted the office regarding symptoms he is currently having, states he feels he may have a sinus infection. Says he has ongoing sinus congestion and chest congestion, has lasted 7 days at least. Pt asked if Dr. Patsy Lager may be able to send in a round of abx to help with this issue. Advised pt that usually providers would prefer to see patients in the office for an exam before prescribing anything. PT states he would rather not have to come in tomorrow due to weather conditions,  is requesting a call back to discuss how to move forward. Can be reached at mobile number

## 2023-06-24 DIAGNOSIS — L570 Actinic keratosis: Secondary | ICD-10-CM | POA: Diagnosis not present

## 2023-06-24 DIAGNOSIS — Z85828 Personal history of other malignant neoplasm of skin: Secondary | ICD-10-CM | POA: Diagnosis not present

## 2023-06-24 DIAGNOSIS — D2262 Melanocytic nevi of left upper limb, including shoulder: Secondary | ICD-10-CM | POA: Diagnosis not present

## 2023-06-24 DIAGNOSIS — D225 Melanocytic nevi of trunk: Secondary | ICD-10-CM | POA: Diagnosis not present

## 2023-06-24 DIAGNOSIS — D1801 Hemangioma of skin and subcutaneous tissue: Secondary | ICD-10-CM | POA: Diagnosis not present

## 2023-06-24 NOTE — Progress Notes (Unsigned)
  Electrophysiology Office Note:   Date:  06/24/2023  ID:  Derek [name], DOB Jul 23, 1946, MRN [id number]  Primary Cardiologist: None Electrophysiologist: Lanier Prude, MD  {Click to update primary MD,subspecialty MD or APP then REFRESH:1}    History of Present Illness:   Derek [name] is a 77 y.o. male with h/o AF s/p ablation 2018 and 2022 seen today for routine electrophysiology followup.   Since last being seen in our clinic the patient reports doing ***.  he denies chest pain, palpitations, dyspnea, PND, orthopnea, nausea, vomiting, dizziness, syncope, edema, weight gain, or early satiety.   Review of systems complete and found to be negative unless listed in HPI.   EP Information / Studies Reviewed:    EKG is ordered today. Personal review as below.       ***  Physical Exam:   VS:  There were no vitals taken for this visit.   Wt Readings from Last 3 Encounters:  04/18/23 170 lb (77.1 kg)  12/31/22 173 lb 8 oz (78.7 kg)  12/19/22 172 lb (78 kg)     GEN: Well nourished, well developed in no acute distress NECK: No JVD; No carotid bruits CARDIAC: {EPRHYTHM:28826}, no murmurs, rubs, gallops RESPIRATORY:  Clear to auscultation without rales, wheezing or rhonchi  ABDOMEN: Soft, non-tender, non-distended EXTREMITIES:  No edema; No deformity   ASSESSMENT AND PLAN:    Persistent atrial fibrillation Post ablation June 2018 and June 2022 Continue Xarelto for stroke prophylaxis EKG today shows ***  Secondary hypercoagulable state Pt on Xarelto as above   {Click here to Review PMH, Prob List, Meds, Allergies, SHx, FHx  :1}   Follow up with {NWGNF:62130} {EPFOLLOW QM:57846}  Signed, Graciella Freer, PA-C

## 2023-06-26 ENCOUNTER — Encounter: Payer: Self-pay | Admitting: Student

## 2023-06-26 ENCOUNTER — Ambulatory Visit: Payer: Medicare Other | Attending: Student | Admitting: Student

## 2023-06-26 VITALS — BP 110/68 | HR 60 | Ht 68.5 in | Wt 172.8 lb

## 2023-06-26 DIAGNOSIS — D6869 Other thrombophilia: Secondary | ICD-10-CM | POA: Diagnosis not present

## 2023-06-26 DIAGNOSIS — I4819 Other persistent atrial fibrillation: Secondary | ICD-10-CM | POA: Diagnosis not present

## 2023-06-26 NOTE — Patient Instructions (Signed)
Medication Instructions:  Your physician recommends that you continue on your current medications as directed. Please refer to the Current Medication list given to you today.  *If you need a refill on your cardiac medications before your next appointment, please call your pharmacy*  Lab Work: BMET, CBC-TODAY If you have labs (blood work) drawn today and your tests are completely normal, you will receive your results only by: MyChart Message (if you have MyChart) OR A paper copy in the mail If you have any lab test that is abnormal or we need to change your treatment, we will call you to review the results.  Follow-Up: At [facility], you and your health needs are our priority.  As part of our continuing mission to provide you with exceptional heart care, we have created designated Provider Care Teams.  These Care Teams include your primary Cardiologist (physician) and Advanced Practice Providers (APPs -  Physician Assistants and Nurse Practitioners) who all work together to provide you with the care you need, when you need it.  Your next appointment:   6 month(s)  Provider:   Steffanie Dunn, MD or [facility]" [place], [place]

## 2023-06-27 LAB — CBC
Hematocrit: 42.5 % (ref 37.5–51.0)
Hemoglobin: 14.2 g/dL (ref 13.0–17.7)
MCH: 31.4 pg (ref 26.6–33.0)
MCHC: 33.4 g/dL (ref 31.5–35.7)
MCV: 94 fL (ref 79–97)
Platelets: 274 10*3/uL (ref 150–450)
RBC: 4.52 x10E6/uL (ref 4.14–5.80)
RDW: 13.1 % (ref 11.6–15.4)
WBC: 5.9 10*3/uL (ref 3.4–10.8)

## 2023-06-27 LAB — BASIC METABOLIC PANEL
BUN/Creatinine Ratio: 12 (ref 10–24)
BUN: 14 mg/dL (ref 8–27)
CO2: 25 mmol/L (ref 20–29)
Calcium: 9.6 mg/dL (ref 8.6–10.2)
Chloride: 104 mmol/L (ref 96–106)
Creatinine, Ser: 1.16 mg/dL (ref 0.76–1.27)
Glucose: 86 mg/dL (ref 70–99)
Potassium: 4.5 mmol/L (ref 3.5–5.2)
Sodium: 141 mmol/L (ref 134–144)
eGFR: 64 mL/min/{1.73_m2} (ref >59)

## 2023-07-08 DIAGNOSIS — Z23 Encounter for immunization: Secondary | ICD-10-CM | POA: Diagnosis not present

## 2023-07-08 DIAGNOSIS — T1512XA Foreign body in conjunctival sac, left eye, initial encounter: Secondary | ICD-10-CM | POA: Diagnosis not present

## 2023-08-01 DIAGNOSIS — Z961 Presence of intraocular lens: Secondary | ICD-10-CM | POA: Diagnosis not present

## 2023-08-05 ENCOUNTER — Other Ambulatory Visit: Payer: Self-pay | Admitting: Cardiology

## 2023-08-05 DIAGNOSIS — I4819 Other persistent atrial fibrillation: Secondary | ICD-10-CM

## 2023-08-05 NOTE — Telephone Encounter (Signed)
Prescription refill request for Xarelto received.  Indication:afib Last office visit:10/24 Weight:78.4 kg Age:77 Scr:1.16  10/24 CrCl:57.26  ml/min  Prescription refilled

## 2023-10-01 ENCOUNTER — Telehealth: Payer: Self-pay | Admitting: *Deleted

## 2023-10-01 DIAGNOSIS — Z7901 Long term (current) use of anticoagulants: Secondary | ICD-10-CM | POA: Diagnosis not present

## 2023-10-01 DIAGNOSIS — Z860101 Personal history of adenomatous and serrated colon polyps: Secondary | ICD-10-CM | POA: Diagnosis not present

## 2023-10-01 DIAGNOSIS — I4891 Unspecified atrial fibrillation: Secondary | ICD-10-CM | POA: Diagnosis not present

## 2023-10-01 NOTE — Telephone Encounter (Signed)
   Pre-operative Risk Assessment    Patient Name: Derek [name]  DOB: 04/10/1946 MRN: [id number]   Date of last office visit: 06/26/23 Date of next office visit: N/A   Request for Surgical Clearance    Procedure:   COLONOSCOPY  Date of Surgery:  Clearance 10/29/23                                Surgeon:  DR. DONNALD Surgeon's Group or Practice Name:  [facility] Phone number:  [phone] Fax number:  [phone]   Type of Clearance Requested:   - Pharmacy:  Hold Rivaroxaban  (Xarelto ) X'S 3 DAYS   Type of Anesthesia:   PROPOFOL    Additional requests/questions:    [facility]   10/01/2023, 11:39 AM

## 2023-10-03 NOTE — Telephone Encounter (Signed)
   Name: MONTA POLICE  DOB: 10/05/1945  MRN: [id number]   Primary Cardiologist: None  Chart reviewed as part of pre-operative protocol coverage. WOFFORD STRATTON was last seen on 06/26/2023 by Jodie Passey, PA-C.  He was doing well at that time with no cardiac complaints.   Per Pharm D, patient may hold Eliquis  for 2 days prior to procedure.    I will route this recommendation to the requesting party via Epic fax function and remove from pre-op pool. Please call with questions.  Barnie Hila, NP 10/03/2023, 4:46 PM

## 2023-10-03 NOTE — Telephone Encounter (Signed)
 Patient with diagnosis of A Fib on Xarelto  for anticoagulation.    Procedure: colonoscopy Date of procedure: 10/29/23   CHA2DS2-VASc Score = 2  This indicates a 2.2% annual risk of stroke. The patient's score is based upon: CHF History: 0 HTN History: 0 Diabetes History: 0 Stroke History: 0 Vascular Disease History: 0 Age Score: 2 Gender Score: 0   CrCl 57 ml/min Platelet count 274K  Per office protocol, patient can hold Eliquis  for 2 days prior to procedure.     **This guidance is not considered finalized until pre-operative APP has relayed final recommendations.**

## 2023-10-22 ENCOUNTER — Telehealth: Payer: Self-pay | Admitting: Cardiology

## 2023-10-22 NOTE — Telephone Encounter (Signed)
Velna Hatchet with GI called back and the question their office has is about hold Xarelto which is what the pt is on, however it has been noted about holding Eliquis. I assured Velna Hatchet that I will f/u with the pharm-d for correction in the notes about blood thinner to be held.   Velna Hatchet states they will need Xarelto to be held x 3 days.   I assured Velna Hatchet once we have the notes correct we will re-fax to their office.

## 2023-10-22 NOTE — Telephone Encounter (Signed)
Left vm for Derek [name] to call back. I did leave on my message if in question about blood thinner hold. Our pharm-d has provided a safe hold time. Asked for their office to call back to discuss further.

## 2023-10-22 NOTE — Telephone Encounter (Signed)
Patient with diagnosis of A Fib on Xarelto for anticoagulation.     Procedure: colonoscopy Date of procedure: 10/29/23     CHA2DS2-VASc Score = 2  This indicates a 2.2% annual risk of stroke. The patient's score is based upon: CHF History: 0 HTN History: 0 Diabetes History: 0 Stroke History: 0 Vascular Disease History: 0 Age Score: 2 Gender Score: 0     CrCl 57 ml/min Platelet count 274K   Per office protocol, patient can hold Xarelto for 2 days prior to procedure. If office needs a 3 day hold will require authorization from cardiologist.     **This guidance is not considered finalized until pre-operative APP has relayed final recommendations.**

## 2023-10-22 NOTE — Telephone Encounter (Signed)
They have questions about the Pre-Op they received

## 2023-10-23 NOTE — Telephone Encounter (Signed)
Please confirm with requesting office that a 2 day hold is ok. If 3 day hold is required, we will need to check with Lanier Prude, MD  Tereso Newcomer, PA-C    10/23/2023 7:10 AM

## 2023-10-23 NOTE — Telephone Encounter (Addendum)
Dr. Lalla Brothers We need ok from patient's cardiologist to hold Xarelto x 3 days as requested by the surgeon's office. Please route back to P CV DIV PREOP. Tereso Newcomer, PA-C    10/23/2023 8:14 AM

## 2023-10-23 NOTE — Telephone Encounter (Signed)
I left a message for Velna Hatchet, surgery scheduler to call our office back to discuss Xarelto hold time. Per our conversation yesterday, Velna Hatchet stated the doctor is asking for Xarelto to be held x 3 days.   I called back again today to confirm this again or if Dr. Matthias Hughs will be acceptable to the 2 day hold per pharm-d recommendations.

## 2023-10-23 NOTE — Telephone Encounter (Signed)
Derek [name] from Dr. Buccini's office called back earlier today with an update ok to hold Xarelto x 2 days. I will forward back to the preop APP for any final notes. Derek [name] asked if our office can send them an update with the Xarelto hold.

## 2023-10-23 NOTE — Telephone Encounter (Signed)
When I s/w the office yesterday I was told they are requesting the hold to be x 3 days.

## 2023-10-24 NOTE — Telephone Encounter (Signed)
Per preop APP Edd Fabian, FNP pt has been cleared as well as noted ok to hold Xarelto x 2 days prior. See previous notes as well

## 2023-10-24 NOTE — Telephone Encounter (Signed)
   Name: Derek [name]  DOB: [birth year]  MRN: [id number]  Primary Cardiologist: None   Preoperative team, please contact this patient and set up a phone call appointment for further preoperative risk assessment. Please obtain consent and complete medication review. Thank you for your help.  I confirm that guidance regarding antiplatelet and oral anticoagulation therapy has been completed and, if necessary, noted below.  Patient with diagnosis of A Fib on Xarelto for anticoagulation.     Procedure: colonoscopy Date of procedure: 10/29/23     CHA2DS2-VASc Score = 2  This indicates a 2.2% annual risk of stroke. The patient's score is based upon: CHF History: 0 HTN History: 0 Diabetes History: 0 Stroke History: 0 Vascular Disease History: 0 Age Score: 2 Gender Score: 0     CrCl 57 ml/min Platelet count 274K   Per office protocol, patient can hold Xarelto for 2 days prior to procedure.  Office return call and noted that 2-day hold of Xarelto would be sufficient.  I also confirmed the patient resides in the state of [place]. As per [facility] Medical Board telemedicine laws, the patient must reside in the state in which the provider is licensed.   Ronney Asters, NP 10/24/2023, 8:46 AM [facility]

## 2023-11-26 DIAGNOSIS — D123 Benign neoplasm of transverse colon: Secondary | ICD-10-CM | POA: Diagnosis not present

## 2023-11-26 DIAGNOSIS — Z09 Encounter for follow-up examination after completed treatment for conditions other than malignant neoplasm: Secondary | ICD-10-CM | POA: Diagnosis not present

## 2023-11-26 DIAGNOSIS — K5730 Diverticulosis of large intestine without perforation or abscess without bleeding: Secondary | ICD-10-CM | POA: Diagnosis not present

## 2023-11-26 DIAGNOSIS — Z860101 Personal history of adenomatous and serrated colon polyps: Secondary | ICD-10-CM | POA: Diagnosis not present

## 2023-12-13 ENCOUNTER — Telehealth: Payer: Self-pay | Admitting: *Deleted

## 2023-12-13 DIAGNOSIS — Z79899 Other long term (current) drug therapy: Secondary | ICD-10-CM

## 2023-12-13 DIAGNOSIS — Z8546 Personal history of malignant neoplasm of prostate: Secondary | ICD-10-CM

## 2023-12-13 DIAGNOSIS — E782 Mixed hyperlipidemia: Secondary | ICD-10-CM

## 2023-12-13 NOTE — Telephone Encounter (Signed)
-----   Message from Alvina Chou sent at 12/13/2023  9:55 AM EDT ----- Regarding: Lab orders for [facility], 4.7.25 Patient is scheduled for CPX labs, please order future labs, Thanks , Camelia Eng

## 2023-12-30 ENCOUNTER — Other Ambulatory Visit (INDEPENDENT_AMBULATORY_CARE_PROVIDER_SITE_OTHER): Payer: Medicare Other

## 2023-12-30 DIAGNOSIS — Z79899 Other long term (current) drug therapy: Secondary | ICD-10-CM

## 2023-12-30 DIAGNOSIS — Z8546 Personal history of malignant neoplasm of prostate: Secondary | ICD-10-CM

## 2023-12-30 DIAGNOSIS — E782 Mixed hyperlipidemia: Secondary | ICD-10-CM | POA: Diagnosis not present

## 2023-12-30 LAB — CBC WITH DIFFERENTIAL/PLATELET
Basophils Absolute: 0.1 10*3/uL (ref 0.0–0.1)
Basophils Relative: 1.2 % (ref 0.0–3.0)
Eosinophils Absolute: 0.2 10*3/uL (ref 0.0–0.7)
Eosinophils Relative: 3.9 % (ref 0.0–5.0)
HCT: 41.8 % (ref 39.0–52.0)
Hemoglobin: 14.3 g/dL (ref 13.0–17.0)
Lymphocytes Relative: 32.2 % (ref 12.0–46.0)
Lymphs Abs: 1.5 10*3/uL (ref 0.7–4.0)
MCHC: 34.3 g/dL (ref 30.0–36.0)
MCV: 93.5 fl (ref 78.0–100.0)
Monocytes Absolute: 0.3 10*3/uL (ref 0.1–1.0)
Monocytes Relative: 6.9 % (ref 3.0–12.0)
Neutro Abs: 2.5 10*3/uL (ref 1.4–7.7)
Neutrophils Relative %: 55.8 % (ref 43.0–77.0)
Platelets: 225 10*3/uL (ref 150.0–400.0)
RBC: 4.47 Mil/uL (ref 4.22–5.81)
RDW: 14.5 % (ref 11.5–15.5)
WBC: 4.6 10*3/uL (ref 4.0–10.5)

## 2023-12-30 LAB — BASIC METABOLIC PANEL WITH GFR
BUN: 14 mg/dL (ref 6–23)
CO2: 31 meq/L (ref 19–32)
Calcium: 9.4 mg/dL (ref 8.4–10.5)
Chloride: 104 meq/L (ref 96–112)
Creatinine, Ser: 1.01 mg/dL (ref 0.40–1.50)
GFR: 70.54 mL/min (ref >60.00)
Glucose, Bld: 88 mg/dL (ref 70–99)
Potassium: 4.5 meq/L (ref 3.5–5.1)
Sodium: 141 meq/L (ref 135–145)

## 2023-12-30 LAB — LIPID PANEL
Cholesterol: 189 mg/dL (ref 0–200)
HDL: 63.6 mg/dL (ref >39.00)
LDL Cholesterol: 115 mg/dL — ABNORMAL HIGH (ref 0–99)
NonHDL: 125.52
Total CHOL/HDL Ratio: 3
Triglycerides: 55 mg/dL (ref 0.0–149.0)
VLDL: 11 mg/dL (ref 0.0–40.0)

## 2023-12-30 LAB — HEPATIC FUNCTION PANEL
ALT: 18 U/L (ref 0–53)
AST: 22 U/L (ref 0–37)
Albumin: 4.4 g/dL (ref 3.5–5.2)
Alkaline Phosphatase: 68 U/L (ref 39–117)
Bilirubin, Direct: 0.1 mg/dL (ref 0.0–0.3)
Total Bilirubin: 0.6 mg/dL (ref 0.2–1.2)
Total Protein: 6.8 g/dL (ref 6.0–8.3)

## 2023-12-31 LAB — PSA, TOTAL WITH REFLEX TO PSA, FREE: PSA, Total: 0.1 ng/mL

## 2024-01-06 ENCOUNTER — Encounter: Payer: Medicare Other | Admitting: Family Medicine

## 2024-01-08 ENCOUNTER — Encounter: Payer: Self-pay | Admitting: Family Medicine

## 2024-01-08 NOTE — Progress Notes (Unsigned)
 Derek [name] T. Phillippe Orlick, MD, CAQ Sports Medicine [facility] at [facility] [address] [place], [address]  Phone: [phone]  FAX: [phone]  Derek [name] - 78 y.o. male  MRN [id number]  Date of Birth: October 21, 1945  Date: 01/09/2024  PCP: Hannah Beat, MD  Referral: Hannah Beat, MD  No chief complaint on file.  Patient Care Team: Hannah Beat, MD as PCP - General Lanier Prude, MD as PCP - Electrophysiology (Cardiology) Heloise Purpura, MD as Consulting Physician (Urology) Arminda Resides, MD as Consulting Physician (Dermatology) Jethro Bolus, MD as Consulting Physician (Ophthalmology) Hillis Range, MD (Inactive) as Consulting Physician (Cardiology) Darlin Priestly. April Holding., DMD as Referring Physician (Dentistry) Subjective:   Derek [name] is a 78 y.o. pleasant patient who presents for a medicare wellness examination:  Preventative Health Maintenance Visit:  Health Maintenance Summary Reviewed and updated, unless pt declines services.  Tobacco History Reviewed. Alcohol: No concerns, no excessive use Exercise Habits: Some activity, rec at least 30 mins 5 times a week STD concerns: no risk or activity to increase risk Drug Use: None  Derek [name] is basically a healthy patient who I have known for many years.  Only really significant medical problem is A-fib and he is on Xarelto.  He has had a number of ablations.  RSV Covid booster  Health Maintenance  Topic Date Due   COVID-19 Vaccine (4 - 2024-25 season) 05/26/2023   Medicare Annual Wellness (AWV)  12/19/2023   INFLUENZA VACCINE  04/24/2024   DTaP/Tdap/Td (2 - Td or Tdap) 12/21/2024   Colonoscopy  11/25/2028   Pneumonia Vaccine 15+ Years old  Completed   Hepatitis C Screening  Completed   Zoster Vaccines- Shingrix  Completed   HPV VACCINES  Aged Out   Meningococcal B Vaccine  Aged Out    Immunization History  Administered Date(s) Administered   Fluad Quad(high Dose  65+) 07/07/2019, 07/18/2022   Influenza, High Dose Seasonal PF 08/21/2021   Influenza,inj,Quad PF,6+ Mos 06/25/2013, 06/16/2014, 07/21/2015, 07/02/2016, 07/22/2017, 08/27/2018   Influenza-Unspecified 08/22/2020   PFIZER(Purple Top)SARS-COV-2 Vaccination 10/13/2019, 11/03/2019, 06/30/2020   Pneumococcal Conjugate-13 05/03/2015   Pneumococcal Polysaccharide-23 08/14/2006, 06/25/2013   Tdap 12/22/2014   Zoster Recombinant(Shingrix) 07/08/2018, 09/09/2018   Zoster, Live 08/14/2006    Patient Active Problem List   Diagnosis Date Noted   History of atrial fibrillation 10/30/2016    Priority: High   PROSTATE CANCER, HX OF 11/10/2008    Priority: High   Bilateral inguinal hernia (BIH) s/p lap repair 07/06/2012 06/24/2012   CARCINOMA, BASAL CELL, HX OF 11/10/2008   DIVERTICULITIS, HX OF 11/10/2008    Past Medical History:  Diagnosis Date   Basal cell carcinoma 2009   back   Cancer of prostate (HCC)    pT2c No Mx, Gleason 3+4=7   Chronic systolic dysfunction of left ventricle    EF 45% by echo 2/18   Diverticulitis    Nephrolithiasis    Paroxysmal atrial fibrillation (HCC)    Rheumatic fever 1952-1953    Past Surgical History:  Procedure Laterality Date   ATRIAL FIBRILLATION ABLATION N/A 02/26/2017   Procedure: Atrial Fibrillation Ablation;  Surgeon: Hillis Range, MD;  Location: [facility] INVASIVE CV LAB;  Service: Cardiovascular;  Laterality: N/A;   ATRIAL FIBRILLATION ABLATION N/A 03/17/2021   Procedure: ATRIAL FIBRILLATION ABLATION;  Surgeon: Hillis Range, MD;  Location: [facility] INVASIVE CV LAB;  Service: Cardiovascular;  Laterality: N/A;   bilateral inguinal hernia repair  07/10/2012   lap BIH repairs  CARDIOVERSION N/A 03/07/2017   Procedure: CARDIOVERSION;  Surgeon: Elmyra Haggard, MD;  Location: [facility] ENDOSCOPY;  Service: Cardiovascular;  Laterality: N/A;   COLONOSCOPY     KNEE ARTHROSCOPY W/ PARTIAL MEDIAL MENISCECTOMY  2005   Duda- arthroscopy, partial medial menisectomy-lt   ROBOT  ASSISTED LAPAROSCOPIC RADICAL PROSTATECTOMY  12/19/06   robotic prostatectomy   rotator cuff surgery  2002   Gioffre-rt   SHOULDER ARTHROSCOPY WITH ROTATOR CUFF REPAIR AND SUBACROMIAL DECOMPRESSION Left 07/10/2013   Procedure: LEFT SHOULDER ARTHROSCOPY WITH ARTHROSCOPIC ROTATOR CUFF REPAIR AND SUBACROMIAL DECOMPRESSION, PARTIAL ACROMIOPLASTY WITH CORACROMIAL RELEASE;  Surgeon: Neville Barbone, MD;  Location: [facility];  Service: Orthopedics;  Laterality: Left;    Family History  Problem Relation Age of Onset   Breast cancer Mother    Cancer Brother 82       brain tumor   Heart disease Other        fam hx   Arthritis Other        other relative   Prostate cancer Other        nephew   Lung cancer Other 37       nephew   Arrhythmia Father     Social History   Social History Narrative   Regular exercise: yes - runner 5d/wk, former marathon runner   Diet: no red meat, good water, fruits/vegetables daily     Past Medical History, Surgical History, Social History, Family History, Problem List, Medications, and Allergies have been reviewed and updated if relevant.  Review of Systems: Pertinent positives are listed above.  Otherwise, a full 14 point review of systems has been done in full and it is negative except where it is noted positive.  Objective:   There were no vitals taken for this visit.    03/17/2021    5:58 AM 04/03/2021    8:58 PM 12/07/2021   11:28 AM 09/19/2022    4:18 PM 12/19/2022    8:28 AM  Fall Risk  Falls in the past year?   0  0  Was there an injury with Fall?   0  0  Fall Risk Category Calculator   0  0  Fall Risk Category (Retired)   Low    (RETIRED) Patient Fall Risk Level Low fall risk Low fall risk Low fall risk Low fall risk   Patient at Risk for Falls Due to   No Fall Risks  No Fall Risks  Fall risk Follow up   Falls prevention discussed  Falls prevention discussed;Falls evaluation completed   Ideal Body Weight:   No results  found.    12/19/2022    8:31 AM 12/07/2021   11:28 AM 12/06/2020   11:59 AM 09/07/2019   10:34 AM 08/27/2018   11:52 AM  Depression screen PHQ 2/9  Decreased Interest 0 0 0 0 0  Down, Depressed, Hopeless 0 0 0 0 0  PHQ - 2 Score 0 0 0 0 0  Altered sleeping   0 0 0  Tired, decreased energy   0 0 0  Change in appetite   0 0 0  Feeling bad or failure about yourself    0 0 0  Trouble concentrating   0 0 0  Moving slowly or fidgety/restless   0 0 0  Suicidal thoughts   0 0 0  PHQ-9 Score   0 0 0  Difficult doing work/chores   Not difficult at all Not difficult at all Not difficult at  all     GEN: well developed, well nourished, no acute distress Eyes: conjunctiva and lids normal, PERRLA, EOMI ENT: TM clear, nares clear, oral exam WNL Neck: supple, no lymphadenopathy, no thyromegaly, no JVD Pulm: clear to auscultation and percussion, respiratory effort normal CV: regular rate and rhythm, S1-S2, no murmur, rub or gallop, no bruits, peripheral pulses normal and symmetric, no cyanosis, clubbing, edema or varicosities GI: soft, non-tender; no hepatosplenomegaly, masses; active bowel sounds all quadrants GU: deferred Lymph: no cervical, axillary or inguinal adenopathy MSK: gait normal, muscle tone and strength WNL, no joint swelling, effusions, discoloration, crepitus  SKIN: clear, good turgor, color WNL, no rashes, lesions, or ulcerations Neuro: normal mental status, normal strength, sensation, and motion Psych: alert; oriented to person, place and time, normally interactive and not anxious or depressed in appearance.  All labs reviewed with patient.  Results for orders placed or performed in visit on 12/30/23  PSA, Total with Reflex to PSA, Free   Collection Time: 12/30/23  7:44 AM  Result Value Ref Range   PSA, Total 0.1 < OR = 4.0 ng/mL  CBC with Differential/Platelet   Collection Time: 12/30/23  7:44 AM  Result Value Ref Range   WBC 4.6 4.0 - 10.5 K/uL   RBC 4.47 4.22 - 5.81  Mil/uL   Hemoglobin 14.3 13.0 - 17.0 g/dL   HCT 09.8 11.9 - 14.7 %   MCV 93.5 78.0 - 100.0 fl   MCHC 34.3 30.0 - 36.0 g/dL   RDW 82.9 56.2 - 13.0 %   Platelets 225.0 150.0 - 400.0 K/uL   Neutrophils Relative % 55.8 43.0 - 77.0 %   Lymphocytes Relative 32.2 12.0 - 46.0 %   Monocytes Relative 6.9 3.0 - 12.0 %   Eosinophils Relative 3.9 0.0 - 5.0 %   Basophils Relative 1.2 0.0 - 3.0 %   Neutro Abs 2.5 1.4 - 7.7 K/uL   Lymphs Abs 1.5 0.7 - 4.0 K/uL   Monocytes Absolute 0.3 0.1 - 1.0 K/uL   Eosinophils Absolute 0.2 0.0 - 0.7 K/uL   Basophils Absolute 0.1 0.0 - 0.1 K/uL  Lipid panel   Collection Time: 12/30/23  7:44 AM  Result Value Ref Range   Cholesterol 189 0 - 200 mg/dL   Triglycerides 86.5 0.0 - 149.0 mg/dL   HDL 78.46 >96.29 mg/dL   VLDL 52.8 0.0 - 41.3 mg/dL   LDL Cholesterol 244 (H) 0 - 99 mg/dL   Total CHOL/HDL Ratio 3    NonHDL 125.52   Hepatic Function Panel   Collection Time: 12/30/23  7:44 AM  Result Value Ref Range   Total Bilirubin 0.6 0.2 - 1.2 mg/dL   Bilirubin, Direct 0.1 0.0 - 0.3 mg/dL   Alkaline Phosphatase 68 39 - 117 U/L   AST 22 0 - 37 U/L   ALT 18 0 - 53 U/L   Total Protein 6.8 6.0 - 8.3 g/dL   Albumin 4.4 3.5 - 5.2 g/dL  Basic metabolic panel   Collection Time: 12/30/23  7:44 AM  Result Value Ref Range   Sodium 141 135 - 145 mEq/L   Potassium 4.5 3.5 - 5.1 mEq/L   Chloride 104 96 - 112 mEq/L   CO2 31 19 - 32 mEq/L   Glucose, Bld 88 70 - 99 mg/dL   BUN 14 6 - 23 mg/dL   Creatinine, Ser 0.10 0.40 - 1.50 mg/dL   GFR 27.25 >36.64 mL/min   Calcium 9.4 8.4 - 10.5 mg/dL    Assessment  and Plan:     ICD-10-CM   1. Healthcare maintenance  Z00.00       Health Maintenance Exam: The patient's preventative maintenance and recommended screening tests for an annual wellness exam were reviewed in full today. Brought up to date unless services declined.  Counselled on the importance of diet, exercise, and its role in overall health and mortality. The  patient's FH and SH was reviewed, including their home life, tobacco status, and drug and alcohol status.  Follow-up in 1 year for physical exam or additional follow-up below.  I have personally reviewed the Medicare Annual Wellness questionnaire and have noted 1. The patient's medical and social history 2. Their use of alcohol, tobacco or illicit drugs 3. Their current medications and supplements 4. The patient's functional ability including ADL's, fall risks, home safety risks and hearing or visual             impairment. 5. Diet and physical activities 6. Evidence for depression or mood disorders 7. Reviewed Updated provider list, see scanned forms and [facility] Snapshot.  8. Reviewed whether or not the patient has HCPOA or living will, and discussed what this means with the patient.  Recommended he bring in a copy for his chart in [facility].  The patients weight, height, BMI and visual acuity have been recorded in the chart I have made referrals, counseling and provided education to the patient based review of the above and I have provided the pt with a written personalized care plan for preventive services.  I have provided the patient with a copy of your personalized plan for preventive services. Instructed to take the time to review along with their updated medication list.  Disposition: No follow-ups on file.  Future Appointments  Date Time Provider Department Center  01/09/2024 11:20 AM Derek [name], Jolena Nay, MD [facility]  02/14/2024  1:40 PM [facility] ANNUAL WELLNESS VISIT 1 [facility]    No orders of the defined types were placed in this encounter.  There are no discontinued medications. No orders of the defined types were placed in this encounter.   Signed,  Derek Bye. Hillard Goodwine, MD   Allergies as of 01/09/2024       Reactions   Flecainide Other (See Comments)   Dizziness and fatigue        Medication List        Accurate as of January 08, 2024  5:00 PM. If you have any  questions, ask your nurse or doctor.          GenTeal Tears 0.1-0.2-0.3 % Soln Place 1-2 drops into both eyes 3 (three) times daily as needed (dry/irritated eyes).   omeprazole 20 MG capsule Commonly known as: PRILOSEC Take 20 mg by mouth as needed.   Vitamin D 50 MCG (2000 UT) tablet Take 2,000 Units by mouth in the morning.   Xarelto 20 MG Tabs tablet Generic drug: rivaroxaban TAKE 1 TABLET BY MOUTH DAILY WITH SUPPER   ZINC 15 PO Take 15 mg by mouth every evening.

## 2024-01-09 ENCOUNTER — Encounter: Payer: Self-pay | Admitting: Family Medicine

## 2024-01-09 ENCOUNTER — Ambulatory Visit: Payer: Medicare Other | Admitting: Family Medicine

## 2024-01-09 VITALS — BP 108/70 | HR 54 | Temp 98.3°F | Ht 68.0 in | Wt 173.0 lb

## 2024-01-09 DIAGNOSIS — Z0000 Encounter for general adult medical examination without abnormal findings: Secondary | ICD-10-CM | POA: Diagnosis not present

## 2024-01-09 NOTE — Progress Notes (Signed)
 Hearing Screening - Comments:: Passed whisper test Vision Screening - Comments:: January 2025

## 2024-02-11 ENCOUNTER — Other Ambulatory Visit: Payer: Self-pay | Admitting: Cardiology

## 2024-02-11 DIAGNOSIS — I4819 Other persistent atrial fibrillation: Secondary | ICD-10-CM

## 2024-02-11 NOTE — Telephone Encounter (Signed)
 Pt last saw Valeri Gate, [place] on 06/26/23, last labs 12/30/23 Creat 1.01, age 78, weight 78.5kg, CrCl 64.77, based on CrCl pt is on appropriate dosage of Xarelto  20mg  every day for afib.  Will refill rx.

## 2024-02-14 ENCOUNTER — Encounter

## 2024-03-09 DIAGNOSIS — H33012 Retinal detachment with single break, left eye: Secondary | ICD-10-CM | POA: Diagnosis not present

## 2024-03-09 DIAGNOSIS — H33052 Total retinal detachment, left eye: Secondary | ICD-10-CM | POA: Diagnosis not present

## 2024-03-09 DIAGNOSIS — H33022 Retinal detachment with multiple breaks, left eye: Secondary | ICD-10-CM | POA: Diagnosis not present

## 2024-03-09 DIAGNOSIS — H43813 Vitreous degeneration, bilateral: Secondary | ICD-10-CM | POA: Diagnosis not present

## 2024-03-09 DIAGNOSIS — Z961 Presence of intraocular lens: Secondary | ICD-10-CM | POA: Diagnosis not present

## 2024-03-18 DIAGNOSIS — H33012 Retinal detachment with single break, left eye: Secondary | ICD-10-CM | POA: Diagnosis not present

## 2024-03-18 DIAGNOSIS — Z9889 Other specified postprocedural states: Secondary | ICD-10-CM | POA: Diagnosis not present

## 2024-04-08 ENCOUNTER — Encounter: Payer: Self-pay | Admitting: Family Medicine

## 2024-04-08 DIAGNOSIS — Z9889 Other specified postprocedural states: Secondary | ICD-10-CM | POA: Diagnosis not present

## 2024-04-08 DIAGNOSIS — H43811 Vitreous degeneration, right eye: Secondary | ICD-10-CM | POA: Diagnosis not present

## 2024-04-08 DIAGNOSIS — H33012 Retinal detachment with single break, left eye: Secondary | ICD-10-CM | POA: Diagnosis not present

## 2024-05-12 DIAGNOSIS — H33012 Retinal detachment with single break, left eye: Secondary | ICD-10-CM | POA: Diagnosis not present

## 2024-05-12 DIAGNOSIS — H31091 Other chorioretinal scars, right eye: Secondary | ICD-10-CM | POA: Diagnosis not present

## 2024-05-12 DIAGNOSIS — H43811 Vitreous degeneration, right eye: Secondary | ICD-10-CM | POA: Diagnosis not present

## 2024-05-12 DIAGNOSIS — Z9889 Other specified postprocedural states: Secondary | ICD-10-CM | POA: Diagnosis not present

## 2024-06-18 DIAGNOSIS — H31092 Other chorioretinal scars, left eye: Secondary | ICD-10-CM | POA: Diagnosis not present

## 2024-06-18 DIAGNOSIS — H43391 Other vitreous opacities, right eye: Secondary | ICD-10-CM | POA: Diagnosis not present

## 2024-06-18 DIAGNOSIS — H43811 Vitreous degeneration, right eye: Secondary | ICD-10-CM | POA: Diagnosis not present

## 2024-06-23 DIAGNOSIS — L570 Actinic keratosis: Secondary | ICD-10-CM | POA: Diagnosis not present

## 2024-06-23 DIAGNOSIS — L821 Other seborrheic keratosis: Secondary | ICD-10-CM | POA: Diagnosis not present

## 2024-06-23 DIAGNOSIS — Z85828 Personal history of other malignant neoplasm of skin: Secondary | ICD-10-CM | POA: Diagnosis not present

## 2024-07-01 DIAGNOSIS — H52203 Unspecified astigmatism, bilateral: Secondary | ICD-10-CM | POA: Diagnosis not present

## 2024-07-01 DIAGNOSIS — H532 Diplopia: Secondary | ICD-10-CM | POA: Diagnosis not present

## 2024-07-01 DIAGNOSIS — Z961 Presence of intraocular lens: Secondary | ICD-10-CM | POA: Diagnosis not present

## 2024-07-06 NOTE — Progress Notes (Unsigned)
  Electrophysiology Office Follow up Visit Note:    Date:  07/06/2024   ID:  Derek [name], DOB 01-26-1946, MRN [id number]  PCP:  Watt Mirza, MD  [facility] Cardiologist:  None  [facility] Electrophysiologist:  OLE ONEIDA HOLTS, MD    Interval History:     Derek [name] is a 78 y.o. male who presents for a follow up visit.   The patient last saw [facility] June 26, 2023.  He has a history of atrial fibrillation with 2 prior catheter ablations.  He takes Xarelto  for stroke prophylaxis.  He is doing well today.  No complaints.  He tells me that he recently injured his left eye moving something out of his shed.  He had a retinal detachment.  Thankfully, this is healed.      Past medical, surgical, social and family history were reviewed.  ROS:   Please see the history of present illness.    All other systems reviewed and are negative.  EKGs/Labs/Other Studies Reviewed:    The following studies were reviewed today:          Physical Exam:    VS:  There were no vitals taken for this visit.    Wt Readings from Last 3 Encounters:  01/09/24 173 lb (78.5 kg)  06/26/23 172 lb 12.8 oz (78.4 kg)  04/18/23 170 lb (77.1 kg)     GEN: no distress CARD: RRR, No MRG RESP: No IWOB. CTAB.      ASSESSMENT:    No diagnosis found. PLAN:    In order of problems listed above:  #Persistent atrial fibrillation Doing well after 2 prior catheter ablations Continue Xarelto  for stroke prophylaxis  I discussed my departure from [facility] during today's clinic appointment.  He will transition his care to one of my EP partners.  Follow-up 1 year with Dr. Almetta.    Signed, OLE HOLTS, MD, [facility], [facility] 07/06/2024 1:02 PM    Electrophysiology [facility]

## 2024-07-07 ENCOUNTER — Ambulatory Visit: Attending: Cardiology | Admitting: Cardiology

## 2024-07-07 ENCOUNTER — Encounter: Payer: Self-pay | Admitting: Cardiology

## 2024-07-07 VITALS — BP 93/55 | HR 56 | Ht 70.0 in | Wt 178.0 lb

## 2024-07-07 DIAGNOSIS — I4819 Other persistent atrial fibrillation: Secondary | ICD-10-CM | POA: Diagnosis present

## 2024-07-07 NOTE — Patient Instructions (Signed)
 Medication Instructions:  Your physician recommends that you continue on your current medications as directed. Please refer to the Current Medication list given to you today.  *If you need a refill on your cardiac medications before your next appointment, please call your pharmacy*  Follow-Up: At [facility], you and your health needs are our priority.  As part of our continuing mission to provide you with exceptional heart care, our providers are all part of one team.  This team includes your primary Cardiologist (physician) and Advanced Practice Providers or APPs (Physician Assistants and Nurse Practitioners) who all work together to provide you with the care you need, when you need it.  Your next appointment:   1 year  Provider:   You may see Dr. Donnice Primus or one of the following Advanced Practice Providers on your designated Care Team:   Charlies Arthur, PA-C Ozell Jodie Passey, PA-C Suzann Riddle, NP Daphne Barrack, NP Artist Pouch, PA-C

## 2024-07-08 DIAGNOSIS — M79671 Pain in right foot: Secondary | ICD-10-CM | POA: Diagnosis not present

## 2024-07-28 DIAGNOSIS — Z23 Encounter for immunization: Secondary | ICD-10-CM | POA: Diagnosis not present

## 2024-08-10 ENCOUNTER — Telehealth: Payer: Self-pay | Admitting: Cardiology

## 2024-08-10 NOTE — Telephone Encounter (Signed)
 Spoke with the patient and advised that we don't typically recommend holding a blood thinner in order to take NSAIDs. He is aware that taking both together puts him at increased risk of bleeding. He states he is only needing to take Aleve for a couple of days.

## 2024-08-10 NOTE — Telephone Encounter (Signed)
 Pt c/o medication issue:  1. Name of Medication: XARELTO  20 MG TABS tablet   2. How are you currently taking this medication (dosage and times per day)? As written  3. Are you having a reaction (difficulty breathing--STAT)? No  4. What is your medication issue? Pt would like to stop taking this medication for 72 hours so he's able to take Aleve for his foot pain.

## 2024-08-15 ENCOUNTER — Other Ambulatory Visit: Payer: Self-pay | Admitting: Cardiology

## 2024-08-15 DIAGNOSIS — I4819 Other persistent atrial fibrillation: Secondary | ICD-10-CM

## 2024-08-18 NOTE — Telephone Encounter (Signed)
 Xarelto  20mg  refill request received. Pt is 78 years old, weight-80.7kg, Crea-1.01 on 12/30/23, last seen by Dr. Cindie on 07/07/24, Diagnosis-Afib, CrCl-66.58 mL/min; Dose is appropriate based on dosing criteria. Will send in refill to requested pharmacy.

## 2024-10-27 ENCOUNTER — Telehealth: Payer: Self-pay | Admitting: Student in an Organized Health Care Education/Training Program

## 2024-10-30 ENCOUNTER — Ambulatory Visit (HOSPITAL_COMMUNITY): Admission: RE | Admit: 2024-10-30 | Admitting: Physician Assistant

## 2024-10-30 VITALS — BP 102/60 | HR 63 | Ht 70.0 in | Wt 181.7 lb

## 2024-10-30 DIAGNOSIS — D6869 Other thrombophilia: Secondary | ICD-10-CM

## 2024-10-30 DIAGNOSIS — I4819 Other persistent atrial fibrillation: Secondary | ICD-10-CM

## 2024-10-30 DIAGNOSIS — I4891 Unspecified atrial fibrillation: Secondary | ICD-10-CM

## 2024-10-30 MED ORDER — METOPROLOL TARTRATE 25 MG PO TABS: 12.5000 mg | ORAL_TABLET | Freq: Two times a day (BID) | ORAL | 1 refills | Status: AC | PRN

## 2024-10-30 NOTE — Progress Notes (Signed)
 "   Primary Care Physician: Watt Mirza, MD Primary Cardiologist: None Electrophysiologist: Donnice DELENA Primus, MD  Referring Physician: Dr Kelsie Zachary JONETTA Emilio is a 79 y.o. male with a history of aortic atherosclerosis, CHF, atrial fibrillation who presents for follow up in the [facility].  The patient was initially diagnosed with atrial fibrillation remotely and had two afib ablations in 2018 and 2022. He was previously maintained on flecainide . Patient is on Xarelto  for stroke prevention.    Patient presents today for follow up for atrial fibrillation. Discussed the use of AI scribe software for clinical note transcription with the patient, who gave verbal consent to proceed.  He experienced a sudden onset of palpitations and sweating on Tuesday morning while having breakfast and coffee. His heart rate fluctuated between 70 and 114 beats per minute. He used a pulse oximeter to monitor his heart rate and took metoprolol , which he had not used since it was prescribed in 2022. He took a half dose at 11 AM and another at 5 PM. By Thursday morning, his heart rate had decreased to 39 beats per minute, prompting him to stop taking metoprolol . Since then, his heart rate has stabilized between 50 to 55 beats per minute. He took a total of four half doses of metoprolol  over two days. He attributes the episode to stress, including concerns about his property in [place]  during a snowstorm and his daughter's home repair issues. He has not been able to exercise due to a detached retina from an accident in June, which has also contributed to his stress. No recent illness, flu, or COVID-19 symptoms. No bleeding issues on anticoagulation.      Today, he denies symptoms of palpitations, chest pain, shortness of breath, orthopnea, PND, lower extremity edema, dizziness, presyncope, syncope, snoring, daytime somnolence, bleeding, or neurologic sequela. The patient is tolerating  medications without difficulties and is otherwise without complaint today.    Atrial Fibrillation Risk Factors:  he does not have symptoms or diagnosis of sleep apnea. he does have a history of rheumatic fever.   Atrial Fibrillation Management history:  Previous antiarrhythmic drugs: flecainide   Previous cardioversions: 2018 Previous ablations: 02/26/17, 03/17/21 Anticoagulation history: Xarelto   ROS- All systems are reviewed and negative except as per the HPI above.  Past Medical History:  Diagnosis Date   Basal cell carcinoma 2009   back   Cancer of prostate (HCC)    pT2c No Mx, Gleason 3+4=7   Chronic systolic dysfunction of left ventricle    EF 45% by echo 2/18   Diverticulitis    Nephrolithiasis    Paroxysmal atrial fibrillation (HCC)    Rheumatic fever 1952-1953    Current Outpatient Medications  Medication Sig Dispense Refill   Artificial Tear Solution (GENTEAL TEARS) 0.1-0.2-0.3 % SOLN Place 1-2 drops into both eyes 3 (three) times daily as needed (dry/irritated eyes).     Cholecalciferol (VITAMIN D) 50 MCG (2000 UT) tablet Take 2,000 Units by mouth in the morning.     metoprolol  tartrate (LOPRESSOR ) 25 MG tablet Take 0.5 tablets (12.5 mg total) by mouth 2 (two) times daily as needed (breakthrough afib HR over 100). 60 tablet 1   omeprazole  (PRILOSEC) 20 MG capsule Take 20 mg by mouth as needed.     XARELTO  20 MG TABS tablet TAKE 1 TABLET BY MOUTH DAILY WITH SUPPER 90 tablet 1   Zinc Sulfate (ZINC 15 PO) Take 15 mg by mouth every evening.     No  current facility-administered medications for this encounter.    Physical Exam: BP 102/60   Pulse 63   Ht 5' 10 (1.778 m)   Wt 82.4 kg   BMI 26.07 kg/m   GEN: Well nourished, well developed in no acute distress CARDIAC: Regular rate and rhythm, no murmurs, rubs, gallops RESPIRATORY:  Clear to auscultation without rales, wheezing or rhonchi  ABDOMEN: Soft, non-tender, non-distended EXTREMITIES:  No edema; No  deformity   Wt Readings from Last 3 Encounters:  10/30/24 82.4 kg  07/07/24 80.7 kg  01/09/24 78.5 kg     EKG Interpretation Date/Time:  Friday October 30 2024 10:56:52 EST Ventricular Rate:  63 PR Interval:  134 QRS Duration:  72 QT Interval:  374 QTC Calculation: 382 R Axis:   -40  Text Interpretation: Normal sinus rhythm with sinus arrhythmia Left axis deviation Minimal voltage criteria for LVH, may be normal variant ( R in aVL ) Abnormal ECG When compared with ECG of 07-Jul-2024 09:21, No significant change was found Confirmed by Johni Narine ([id number]) on 10/30/2024 11:22:10 AM    Echo 02/01/21 demonstrated   1. Left ventricular ejection fraction, by estimation, is 50 to 55%. The  left ventricle has low normal function. The left ventricle has no regional  wall motion abnormalities. Left ventricular diastolic parameters are  consistent with Grade I diastolic dysfunction (impaired relaxation). The average left ventricular global longitudinal strain is -23.1 %. The global longitudinal strain is normal.   2. Right ventricular systolic function is normal. The right ventricular  size is normal. There is normal pulmonary artery systolic pressure.   3. The mitral valve is normal in structure. Trivial mitral valve  regurgitation. No evidence of mitral stenosis.   4. The aortic valve is normal in structure. Aortic valve regurgitation is  mild to moderate. No aortic stenosis is present. Aortic regurgitation PHT  measures 627 msec.   5. There is borderline dilatation of the aortic root, measuring 39 mm.   6. The inferior vena cava is normal in size with greater than 50%  respiratory variability, suggesting right atrial pressure of 3 mmHg.   Comparison(s): 09/10/17 EF 50-55%.     CHA2DS2-VASc Score = 3  The patient's score is based upon: CHF History: 0 HTN History: 0 Diabetes History: 0 Stroke History: 0 Vascular Disease History: 1 Age Score: 2 Gender Score: 0       ASSESSMENT  AND PLAN: Persistent Atrial Fibrillation (ICD10:  I48.19) The patient's CHA2DS2-VASc score is 3, indicating a 3.2% annual risk of stroke.   S/p afib ablation 02/26/17 and 03/17/21 Patient back in SR. This was his first episode in nearly 4 years.  Will continue present treatment for now. Continue Lopressor  12.5 mg PRN BID for heart racing.  Continue Xarelto  20 mg daily  Secondary Hypercoagulable State (ICD10:  D68.69) The patient is at significant risk for stroke/thromboembolism based upon his CHA2DS2-VASc Score of 3.  Continue Rivaroxaban  (Xarelto ). No bleeding issues.   HFrecEF H/o mildly reduced EF in setting of afib. Normalized with SR. Fluid status appears stable today   Follow up with Dr Almetta or EP APP per recall.     [facility] [address] [place], [place] [address] [phone] "

## 2025-01-04 ENCOUNTER — Other Ambulatory Visit

## 2025-01-11 ENCOUNTER — Encounter: Admitting: Family Medicine
